# Patient Record
Sex: Female | Born: 1950
Health system: Southern US, Community
[De-identification: ages and names within clinical notes are randomized; demographics above are authoritative.]

## PROBLEM LIST (undated history)

## (undated) DIAGNOSIS — E119 Type 2 diabetes mellitus without complications: Secondary | ICD-10-CM

## (undated) DIAGNOSIS — E785 Hyperlipidemia, unspecified: Secondary | ICD-10-CM

## (undated) DIAGNOSIS — E1159 Type 2 diabetes mellitus with other circulatory complications: Secondary | ICD-10-CM

## (undated) DIAGNOSIS — E1122 Type 2 diabetes mellitus with diabetic chronic kidney disease: Secondary | ICD-10-CM

## (undated) DIAGNOSIS — T7840XA Allergy, unspecified, initial encounter: Secondary | ICD-10-CM

## (undated) DIAGNOSIS — H269 Unspecified cataract: Secondary | ICD-10-CM

## (undated) DIAGNOSIS — I10 Essential (primary) hypertension: Secondary | ICD-10-CM

## (undated) DIAGNOSIS — M5412 Radiculopathy, cervical region: Secondary | ICD-10-CM

## (undated) DIAGNOSIS — M19019 Primary osteoarthritis, unspecified shoulder: Secondary | ICD-10-CM

## (undated) DIAGNOSIS — D509 Iron deficiency anemia, unspecified: Secondary | ICD-10-CM

## (undated) DIAGNOSIS — N182 Chronic kidney disease, stage 2 (mild): Secondary | ICD-10-CM

## (undated) DIAGNOSIS — E559 Vitamin D deficiency, unspecified: Secondary | ICD-10-CM

## (undated) DIAGNOSIS — E1169 Type 2 diabetes mellitus with other specified complication: Secondary | ICD-10-CM

## (undated) DIAGNOSIS — M4722 Other spondylosis with radiculopathy, cervical region: Secondary | ICD-10-CM

## (undated) HISTORY — DX: Hyperlipidemia, unspecified: E78.5

## (undated) HISTORY — DX: Chronic kidney disease, stage 2 (mild): N18.2

## (undated) HISTORY — DX: Type 2 diabetes mellitus with other specified complication: E11.69

## (undated) HISTORY — DX: Radiculopathy, cervical region: M54.12

## (undated) HISTORY — DX: Iron deficiency anemia, unspecified: D50.9

## (undated) HISTORY — DX: Unspecified cataract: H26.9

## (undated) HISTORY — DX: Type 2 diabetes mellitus without complications: E11.9

## (undated) HISTORY — DX: Type 2 diabetes mellitus with diabetic chronic kidney disease: E11.22

## (undated) HISTORY — DX: Type 2 diabetes mellitus with other circulatory complications: E11.59

## (undated) HISTORY — DX: Essential (primary) hypertension: I10

## (undated) HISTORY — DX: Allergy, unspecified, initial encounter: T78.40XA

## (undated) HISTORY — PX: OTHER SURGICAL HISTORY: SHX169

## (undated) HISTORY — DX: Other spondylosis with radiculopathy, cervical region: M47.22

## (undated) HISTORY — DX: Vitamin D deficiency, unspecified: E55.9

## (undated) HISTORY — DX: Primary osteoarthritis, unspecified shoulder: M19.019

---

## 2002-07-30 ENCOUNTER — Other Ambulatory Visit: Admission: RE | Admit: 2002-07-30 | Discharge: 2002-07-30 | Payer: Self-pay | Admitting: Obstetrics and Gynecology

## 2003-10-20 ENCOUNTER — Other Ambulatory Visit: Admission: RE | Admit: 2003-10-20 | Discharge: 2003-10-20 | Payer: Self-pay | Admitting: Obstetrics and Gynecology

## 2004-11-21 ENCOUNTER — Other Ambulatory Visit: Admission: RE | Admit: 2004-11-21 | Discharge: 2004-11-21 | Payer: Self-pay | Admitting: Obstetrics and Gynecology

## 2005-12-09 ENCOUNTER — Other Ambulatory Visit: Admission: RE | Admit: 2005-12-09 | Discharge: 2005-12-09 | Payer: Self-pay | Admitting: Obstetrics and Gynecology

## 2006-06-30 ENCOUNTER — Ambulatory Visit (HOSPITAL_COMMUNITY): Admission: RE | Admit: 2006-06-30 | Discharge: 2006-06-30 | Payer: Self-pay | Admitting: Pulmonary Disease

## 2008-09-19 ENCOUNTER — Ambulatory Visit (HOSPITAL_COMMUNITY): Admission: RE | Admit: 2008-09-19 | Discharge: 2008-09-19 | Payer: Self-pay | Admitting: Pulmonary Disease

## 2008-10-18 ENCOUNTER — Ambulatory Visit (HOSPITAL_COMMUNITY): Admission: RE | Admit: 2008-10-18 | Discharge: 2008-10-18 | Payer: Self-pay | Admitting: Pulmonary Disease

## 2008-12-16 ENCOUNTER — Encounter: Admission: RE | Admit: 2008-12-16 | Discharge: 2009-02-28 | Payer: Self-pay | Admitting: Otolaryngology

## 2012-09-17 ENCOUNTER — Encounter (HOSPITAL_COMMUNITY): Payer: Self-pay | Admitting: *Deleted

## 2012-09-17 ENCOUNTER — Emergency Department (HOSPITAL_COMMUNITY)
Admission: EM | Admit: 2012-09-17 | Discharge: 2012-09-17 | Disposition: A | Discharge status: Hospice | Payer: Federal, State, Local not specified - PPO | Source: Home / Self Care

## 2012-09-17 ENCOUNTER — Encounter (HOSPITAL_COMMUNITY): Payer: Self-pay | Admitting: Emergency Medicine

## 2012-09-17 ENCOUNTER — Inpatient Hospital Stay (HOSPITAL_COMMUNITY)
Admission: EM | Admit: 2012-09-17 | Discharge: 2012-09-18 | DRG: 316 | Disposition: A | Discharge status: Home / Self Care | Payer: Federal, State, Local not specified - PPO | Attending: Internal Medicine | Admitting: Internal Medicine

## 2012-09-17 DIAGNOSIS — Z79899 Other long term (current) drug therapy: Secondary | ICD-10-CM

## 2012-09-17 DIAGNOSIS — R5383 Other fatigue: Secondary | ICD-10-CM

## 2012-09-17 DIAGNOSIS — N179 Acute kidney failure, unspecified: Principal | ICD-10-CM

## 2012-09-17 DIAGNOSIS — E1169 Type 2 diabetes mellitus with other specified complication: Secondary | ICD-10-CM

## 2012-09-17 DIAGNOSIS — I10 Essential (primary) hypertension: Secondary | ICD-10-CM

## 2012-09-17 DIAGNOSIS — R51 Headache: Secondary | ICD-10-CM

## 2012-09-17 DIAGNOSIS — R531 Weakness: Secondary | ICD-10-CM | POA: Diagnosis present

## 2012-09-17 DIAGNOSIS — R5381 Other malaise: Secondary | ICD-10-CM

## 2012-09-17 DIAGNOSIS — Z23 Encounter for immunization: Secondary | ICD-10-CM

## 2012-09-17 DIAGNOSIS — N058 Unspecified nephritic syndrome with other morphologic changes: Secondary | ICD-10-CM | POA: Diagnosis present

## 2012-09-17 DIAGNOSIS — E119 Type 2 diabetes mellitus without complications: Secondary | ICD-10-CM

## 2012-09-17 DIAGNOSIS — R519 Headache, unspecified: Secondary | ICD-10-CM

## 2012-09-17 DIAGNOSIS — IMO0002 Reserved for concepts with insufficient information to code with codable children: Secondary | ICD-10-CM

## 2012-09-17 DIAGNOSIS — M129 Arthropathy, unspecified: Secondary | ICD-10-CM | POA: Diagnosis present

## 2012-09-17 DIAGNOSIS — E1129 Type 2 diabetes mellitus with other diabetic kidney complication: Secondary | ICD-10-CM | POA: Diagnosis present

## 2012-09-17 DIAGNOSIS — E785 Hyperlipidemia, unspecified: Secondary | ICD-10-CM

## 2012-09-17 DIAGNOSIS — E860 Dehydration: Secondary | ICD-10-CM | POA: Diagnosis present

## 2012-09-17 HISTORY — DX: Essential (primary) hypertension: I10

## 2012-09-17 HISTORY — DX: Type 2 diabetes mellitus with other specified complication: E11.69

## 2012-09-17 LAB — CBC WITH DIFFERENTIAL/PLATELET
Basophils Absolute: 0 10*3/uL (ref 0.0–0.1)
Eosinophils Relative: 2 % (ref 0–5)
HCT: 31.2 % — ABNORMAL LOW (ref 36.0–46.0)
Lymphocytes Relative: 34 % (ref 12–46)
Lymphs Abs: 2.6 10*3/uL (ref 0.7–4.0)
MCV: 86.7 fL (ref 78.0–100.0)
Monocytes Absolute: 0.8 10*3/uL (ref 0.1–1.0)
Neutro Abs: 4 10*3/uL (ref 1.7–7.7)
Platelets: 262 10*3/uL (ref 150–400)
RBC: 3.6 MIL/uL — ABNORMAL LOW (ref 3.87–5.11)
RDW: 13.1 % (ref 11.5–15.5)
WBC: 7.6 10*3/uL (ref 4.0–10.5)

## 2012-09-17 LAB — CK: Total CK: 39 U/L (ref 7–177)

## 2012-09-17 LAB — BASIC METABOLIC PANEL
CO2: 26 mEq/L (ref 19–32)
Chloride: 99 mEq/L (ref 96–112)
Glucose, Bld: 107 mg/dL — ABNORMAL HIGH (ref 70–99)
Sodium: 136 mEq/L (ref 135–145)

## 2012-09-17 LAB — CBC
Hemoglobin: 10.8 g/dL — ABNORMAL LOW (ref 12.0–15.0)
MCH: 29 pg (ref 26.0–34.0)
MCV: 87.4 fL (ref 78.0–100.0)
RBC: 3.73 MIL/uL — ABNORMAL LOW (ref 3.87–5.11)

## 2012-09-17 LAB — TROPONIN I: Troponin I: <0.3 ng/mL (ref <0.30)

## 2012-09-17 LAB — URINALYSIS W MICROSCOPIC + REFLEX CULTURE
Glucose, UA: NEGATIVE mg/dL
Nitrite: NEGATIVE
Specific Gravity, Urine: 1.013 (ref 1.005–1.030)
pH: 5.5 (ref 5.0–8.0)

## 2012-09-17 LAB — POCT URINALYSIS DIP (DEVICE)
Protein, ur: NEGATIVE mg/dL
Specific Gravity, Urine: 1.025 (ref 1.005–1.030)
pH: 5.5 (ref 5.0–8.0)

## 2012-09-17 LAB — POCT I-STAT, CHEM 8
BUN: 81 mg/dL — ABNORMAL HIGH (ref 6–23)
Calcium, Ion: 1.32 mmol/L — ABNORMAL HIGH (ref 1.13–1.30)
Hemoglobin: 13.9 g/dL (ref 12.0–15.0)
TCO2: 33 mmol/L (ref 0–100)

## 2012-09-17 LAB — OSMOLALITY: Osmolality: 310 mOsm/kg — ABNORMAL HIGH (ref 275–300)

## 2012-09-17 LAB — POCT PREGNANCY, URINE: Preg Test, Ur: NEGATIVE

## 2012-09-17 MED ORDER — POLYETHYLENE GLYCOL 3350 17 G PO PACK
17.0000 g | PACK | Freq: Every day | ORAL | Status: DC | PRN
  Filled 2012-09-17: qty 1

## 2012-09-17 MED ORDER — GUAIFENESIN-DM 100-10 MG/5ML PO SYRP: 5.0000 mL | ORAL_SOLUTION | ORAL | Status: DC | PRN

## 2012-09-17 MED ORDER — ALBUTEROL SULFATE (5 MG/ML) 0.5% IN NEBU: 2.5000 mg | INHALATION_SOLUTION | RESPIRATORY_TRACT | Status: DC | PRN

## 2012-09-17 MED ORDER — INSULIN ASPART 100 UNIT/ML ~~LOC~~ SOLN: 0.0000 [IU] | Freq: Three times a day (TID) | SUBCUTANEOUS | Status: DC

## 2012-09-17 MED ORDER — ONDANSETRON HCL 4 MG/2ML IJ SOLN: 4.0000 mg | Freq: Four times a day (QID) | INTRAMUSCULAR | Status: DC | PRN

## 2012-09-17 MED ORDER — FLUCONAZOLE 100 MG PO TABS: ORAL_TABLET | ORAL | Status: DC

## 2012-09-17 MED ORDER — OMEGA-3-ACID ETHYL ESTERS 1 G PO CAPS
1.0000 g | ORAL_CAPSULE | Freq: Two times a day (BID) | ORAL | Status: DC
  Administered 2012-09-17: 1 g via ORAL
  Filled 2012-09-17 (×3): qty 1

## 2012-09-17 MED ORDER — CALCIUM CARBONATE-VITAMIN D 500-200 MG-UNIT PO TABS
1.0000 | ORAL_TABLET | Freq: Every day | ORAL | Status: DC
  Administered 2012-09-18: 1 via ORAL
  Filled 2012-09-17 (×2): qty 1

## 2012-09-17 MED ORDER — ONDANSETRON HCL 4 MG PO TABS: 4.0000 mg | ORAL_TABLET | Freq: Four times a day (QID) | ORAL | Status: DC | PRN

## 2012-09-17 MED ORDER — SODIUM CHLORIDE 0.9 % IV BOLUS (SEPSIS)
2000.0000 mL | Freq: Once | INTRAVENOUS | Status: AC
  Administered 2012-09-17: 2000 mL via INTRAVENOUS

## 2012-09-17 MED ORDER — LINAGLIPTIN 5 MG PO TABS
5.0000 mg | ORAL_TABLET | Freq: Every day | ORAL | Status: DC
  Filled 2012-09-17: qty 1

## 2012-09-17 MED ORDER — PNEUMOCOCCAL VAC POLYVALENT 25 MCG/0.5ML IJ INJ
0.5000 mL | INJECTION | INTRAMUSCULAR | Status: DC
  Filled 2012-09-17: qty 0.5

## 2012-09-17 MED ORDER — SITAGLIPTIN PHOS-METFORMIN HCL 50-1000 MG PO TABS: 1.0000 | ORAL_TABLET | Freq: Every day | ORAL | Status: DC

## 2012-09-17 MED ORDER — SODIUM CHLORIDE 0.9 % IJ SOLN
3.0000 mL | Freq: Two times a day (BID) | INTRAMUSCULAR | Status: DC
  Administered 2012-09-17: 3 mL via INTRAVENOUS

## 2012-09-17 MED ORDER — PREDNISONE 5 MG PO TABS
5.0000 mg | ORAL_TABLET | Freq: Every day | ORAL | Status: DC
  Administered 2012-09-18: 5 mg via ORAL
  Filled 2012-09-17 (×2): qty 1

## 2012-09-17 MED ORDER — HYDROCODONE-ACETAMINOPHEN 5-325 MG PO TABS: 1.0000 | ORAL_TABLET | ORAL | Status: DC | PRN

## 2012-09-17 MED ORDER — SODIUM CHLORIDE 0.9 % IV SOLN
INTRAVENOUS | Status: DC
  Administered 2012-09-18: 1000 mL via INTRAVENOUS

## 2012-09-17 MED ORDER — SODIUM CHLORIDE 0.9 % IV SOLN: Freq: Once | INTRAVENOUS | Status: DC

## 2012-09-17 MED ORDER — ATORVASTATIN CALCIUM 10 MG PO TABS
10.0000 mg | ORAL_TABLET | Freq: Every day | ORAL | Status: DC
  Filled 2012-09-17: qty 1

## 2012-09-17 MED ORDER — HEPARIN SODIUM (PORCINE) 5000 UNIT/ML IJ SOLN
5000.0000 [IU] | Freq: Three times a day (TID) | INTRAMUSCULAR | Status: DC
  Administered 2012-09-17 – 2012-09-18 (×2): 5000 [IU] via SUBCUTANEOUS
  Filled 2012-09-17 (×5): qty 1

## 2012-09-17 NOTE — H&P (Addendum)
[facility]                                                                                    Patient Demographics  Kristie King, is a 62 y.o. female  CSN: [id number]  MRN: [id number]  DOB - [birth year]  Admit Date - 09/17/2012  Outpatient Primary MD for the patient is Eino Farber, MD   With History of -  Past Medical History  Diagnosis Date  . Diabetes mellitus without complication   . Dyslipidemia 09/17/2012  . HTN (hypertension) 09/17/2012      History reviewed. No pertinent past surgical history.  in for   Chief Complaint  Patient presents with  . Headache  . Fatigue     HPI  Kristie King  is a 62 y.o. female, with history of diabetes mellitus type 2, hypertension, dyslipidemia, chronic skin rash of unclear type for which she has been placed on prednisone by her PCP for a while, who has been on Lasix  "for fluid retention along her belly due to prednisone side effect" , also takes Motrin on as needed basis for joint pains and aches, also exercises on a regular basis, she was in her usual state of health until a few days ago when she started experiencing generalized weakness and some lightheadedness, she called her PCPs office and was placed on meclizine without much benefit, she then presented to the ER where workup was suggested of dehydration and acute renal failure and I was called to admit the patient.    Review of Systems    In addition to the HPI above,   No Fever-chills, No Headache, No changes with Vision or hearing, No problems swallowing food or Liquids, No Chest pain, Cough or Shortness of Breath, No Abdominal pain, No Nausea or Vommitting, Bowel movements are regular, No Blood in stool or Urine, No dysuria, No new skin rashes or bruises, No new joints pains-aches,  No new weakness, tingling, numbness in any extremity, positive for generalized weakness and some lightheadedness over the last few days No recent weight gain or loss, No  polyuria, polydypsia or polyphagia, No significant Mental Stressors.  A full 10 point Review of Systems was done, except as stated above, all other Review of Systems were negative.   Social History History  Substance Use Topics  . Smoking status: No   . Smokeless tobacco: No   . Alcohol Use: No       Family History No history of chronic kidney disease in the family  Prior to Admission medications   Medication Sig Start Date End Date Taking? Authorizing Provider  amLODipine-benazepril (LOTREL) 5-20 MG per capsule Take 1 capsule by mouth daily.   Yes Historical Provider, MD  Calcium Carbonate-Vitamin D (OSCAL 500/200 D-3 PO) Take 1 tablet by mouth daily.   Yes Historical Provider, MD  Choline Fenofibrate 135 MG capsule Take 135 mg by mouth daily.   Yes Historical Provider, MD  fluticasone (VERAMYST) 27.5 MCG/SPRAY nasal spray Place 2 sprays into both nostrils 2 (two) times daily.   Yes Historical Provider, MD  furosemide (LASIX) 40 MG tablet Take 40 mg by mouth daily.   Yes  Historical Provider, MD  ibuprofen (ADVIL,MOTRIN) 800 MG tablet Take 800 mg by mouth daily. Taking as scheduled   Yes Historical Provider, MD  meclizine (ANTIVERT) 25 MG tablet Take 25 mg by mouth 3 (three) times daily as needed for dizziness or nausea.    Yes Historical Provider, MD  Omega-3 Fatty Acids (FISH OIL) 1000 MG CAPS Take 2 capsules by mouth daily.   Yes Historical Provider, MD  predniSONE (DELTASONE) 5 MG tablet Take 5 mg by mouth daily.   Yes Historical Provider, MD  rosuvastatin (CRESTOR) 10 MG tablet Take 10 mg by mouth daily.   Yes Historical Provider, MD  sitaGLIPtan-metformin (JANUMET) 50-1000 MG per tablet Take 1 tablet by mouth daily.   Yes Historical Provider, MD    No Known Allergies  Physical Exam  Vitals  Blood pressure 106/76, pulse 73, temperature 97.7 F (36.5 C), temperature source Oral, resp. rate 16, SpO2 100.00%.   1. General late African American female lying in bed in NAD,     2. Normal affect and insight, Not Suicidal or Homicidal, Awake Alert, Oriented X 3.  3. No F.N deficits, ALL C.Nerves Intact, Strength 5/5 all 4 extremities, Sensation intact all 4 extremities, Plantars down going.  4. Ears and Eyes appear Normal, Conjunctivae clear, PERRLA. Moist Oral Mucosa.  5. Supple Neck, No JVD, No cervical lymphadenopathy appriciated, No Carotid Bruits.  6. Symmetrical Chest wall movement, Good air movement bilaterally, CTAB.  7. RRR, No Gallops, Rubs or Murmurs, No Parasternal Heave.  8. Positive Bowel Sounds, Abdomen Soft, Non tender, No organomegaly appriciated,No rebound -guarding or rigidity.  9.  No Cyanosis, Normal Skin Turgor, No Skin Rash or Bruise.  10. Good muscle tone,  joints appear normal , no effusions, Normal ROM.  11. No Palpable Lymph Nodes in Neck or Axillae     Data Review  CBC  Recent Labs Lab 09/17/12 1206 09/17/12 1632  WBC  --  7.6  HGB 13.9 10.5*  HCT 41.0 31.2*  PLT  --  262  MCV  --  86.7  MCH  --  29.2  MCHC  --  33.7  RDW  --  13.1  LYMPHSABS  --  2.6  MONOABS  --  0.8  EOSABS  --  0.2  BASOSABS  --  0.0   ------------------------------------------------------------------------------------------------------------------  Chemistries   Recent Labs Lab 09/17/12 1206 09/17/12 1632  NA 138 136  K 3.6 3.8  CL 96 99  CO2  --  26  GLUCOSE 58* 107*  BUN 81* 77*  CREATININE 3.00* 2.39*  CALCIUM  --  9.2   ------------------------------------------------------------------------------------------------------------------ CrCl is unknown because there is no height on file for the current visit. ------------------------------------------------------------------------------------------------------------------ No results found for this basename: TSH, T4TOTAL, FREET3, T3FREE, THYROIDAB,  in the last 72 hours   Coagulation profile No results found for this basename: INR, PROTIME,  in the last 168  hours ------------------------------------------------------------------------------------------------------------------- No results found for this basename: DDIMER,  in the last 72 hours -------------------------------------------------------------------------------------------------------------------  Cardiac Enzymes  Recent Labs Lab 09/17/12 1632  TROPONINI <0.30   ------------------------------------------------------------------------------------------------------------------ No components found with this basename: POCBNP,    ---------------------------------------------------------------------------------------------------------------  Urinalysis    Component Value Date/Time   LABSPEC 1.025 09/17/2012 1233   PHURINE 5.5 09/17/2012 1233   GLUCOSEU NEGATIVE 09/17/2012 1233   HGBUR NEGATIVE 09/17/2012 1233   BILIRUBINUR NEGATIVE 09/17/2012 1233   KETONESUR TRACE* 09/17/2012 1233   PROTEINUR NEGATIVE 09/17/2012 1233   UROBILINOGEN 0.2 09/17/2012 1233   NITRITE NEGATIVE  09/17/2012 1233   LEUKOCYTESUR MODERATE* 09/17/2012 1233    ----------------------------------------------------------------------------------------------------------------  Imaging results:   No results found.  My personal review of EKG: Rhythm NSR, Rate  69/min,   no Acute ST changes    Assessment & Plan     1. Acute renal failure in a patient who's been on Lasix and Motrin for a while. Likely due to prerenal azotemia along with nephrotoxic effects of NSAIDs. She will be admitted on a telemetry bed, will obtain urine electrolytes, IV fluids for hydration, will also check a CK level and a urine myoglobin and as she has been exercising lately, repeat BMP in the morning. Get labs from PCP to check her baseline renal function. No labs in the system. Of note creatinine has already improved after IV fluids in the ER.   Addendum PCP office called ER. Her last creatinine 2 months ago was 1.38. Likely has chronic  kidney disease stage III underlying probably from diabetic nephropathy.     2. History of diabetes mellitus type 2. Continue home oral supplementation along with sliding scale insulin, will check A1c.    3. Hypertension we'll continue home dose Norvasc, will discontinue Lasix.    4. Dyslipidemia continue home dose statin.    5. Generalized weakness due to dehydration and from #1 above. Supportive care along with IV fluids. She is already feeling better.     DVT Prophylaxis Heparin    AM Labs Ordered, also please review Full Orders  Family Communication: Admission, patients condition and plan of care including tests being ordered have been discussed with the patient full who indicates understanding and agree with the plan and Code Status.  Code Status full  Likely DC to home  Time spent in minutes :35  Condition Marinell Blight K M.D on 09/17/2012 at 5:38 PM  Between 7am to 7pm - Pager - [phone]  After 7pm go to [email or web address] - password [email or web address]  And look for the night coverage person covering me after hours  [facility] Office  [phone]

## 2012-09-17 NOTE — ED Notes (Signed)
Admission MD at bedside.  

## 2012-09-17 NOTE — ED Notes (Signed)
IV NS  TKO  VIA  20  ANGIO      L  FOREARM      1   ATT

## 2012-09-17 NOTE — ED Provider Notes (Signed)
Medical screening examination/treatment/procedure(s) were performed by a resident physician or non-physician practitioner and as the supervising physician I was immediately available for consultation/collaboration.  Clementeen Graham, MD   Rodolph Bong, MD 09/17/12 [phone]

## 2012-09-17 NOTE — ED Notes (Signed)
Will draw lab work after 2L of fluid runs in per MD.

## 2012-09-17 NOTE — ED Provider Notes (Signed)
History    CSN: [id number] Arrival date & time 09/17/12  1339  First MD Initiated Contact with Patient 09/17/12 1411     Chief Complaint  Patient presents with  . Headache  . Fatigue   (Consider location/radiation/quality/duration/timing/severity/associated sxs/prior Treatment) Patient is a 62 y.o. female presenting with headaches. The history is provided by the patient and a relative.  Headache Associated symptoms comment:  Kristie King is a 62 y.o. female h/o HTN, DM, here with fatigue for past 5 days.  She admits to intermittent headaches and presyncope as well.  Patient denies any neurological symptoms, chest pain, SOB, blood in her stool or urine.  She has been eating and drinking well during the interval.  She went to an urgent care facility who found her Cr to be 3.00.  She was transferred her for continued care.    Past Medical History  Diagnosis Date  . Diabetes mellitus without complication    History reviewed. No pertinent past surgical history. No family history on file. History  Substance Use Topics  . Smoking status: Not on file  . Smokeless tobacco: Not on file  . Alcohol Use: Not on file   OB History   Grav Para Term Preterm Abortions TAB SAB Ect Mult Living                 Review of Systems  Neurological: Positive for headaches.  10 Systems reviewed and are negative for acute change except as noted in the HPI.   Allergies  Review of patient's allergies indicates no known allergies.  Home Medications   Current Outpatient Rx  Name  Route  Sig  Dispense  Refill  . amLODipine-benazepril (LOTREL) 5-20 MG per capsule   Oral   Take 1 capsule by mouth daily.         . Calcium Carbonate-Vitamin D (OSCAL 500/200 D-3 PO)   Oral   Take 1 tablet by mouth daily.         . Choline Fenofibrate 135 MG capsule   Oral   Take 135 mg by mouth daily.         . fluticasone (VERAMYST) 27.5 MCG/SPRAY nasal spray   Each Nare   Place 2 sprays into both nostrils  2 (two) times daily.         . furosemide (LASIX) 40 MG tablet   Oral   Take 40 mg by mouth daily.         Marland Kitchen ibuprofen (ADVIL,MOTRIN) 800 MG tablet   Oral   Take 800 mg by mouth daily. Taking as scheduled         . meclizine (ANTIVERT) 25 MG tablet   Oral   Take 25 mg by mouth 3 (three) times daily as needed for dizziness or nausea.          . Omega-3 Fatty Acids (FISH OIL) 1000 MG CAPS   Oral   Take 2 capsules by mouth daily.         . predniSONE (DELTASONE) 5 MG tablet   Oral   Take 5 mg by mouth daily.         . rosuvastatin (CRESTOR) 10 MG tablet   Oral   Take 10 mg by mouth daily.         . sitaGLIPtan-metformin (JANUMET) 50-1000 MG per tablet   Oral   Take 1 tablet by mouth daily.          BP 106/76  Pulse 73  Temp(Src)  97.7 F (36.5 C) (Oral)  Resp 16  SpO2 100% Physical Exam  Nursing note and vitals reviewed. Constitutional: She is oriented to person, place, and time. She appears well-developed and well-nourished. No distress.  HENT:  Head: Normocephalic and atraumatic.  Eyes: Conjunctivae and EOM are normal. Pupils are equal, round, and reactive to light. No scleral icterus.  Neck: Normal range of motion. Neck supple. No JVD present. No tracheal deviation present. No thyromegaly present.  Cardiovascular: Normal rate, regular rhythm and normal heart sounds.  Exam reveals no gallop and no friction rub.   No murmur heard. Pulmonary/Chest: Effort normal and breath sounds normal. No respiratory distress. She has no wheezes. She exhibits no tenderness.  Abdominal: Soft. Bowel sounds are normal. She exhibits no distension and no mass. There is no tenderness. There is no rebound and no guarding.  Musculoskeletal: Normal range of motion. She exhibits no edema and no tenderness.  Lymphadenopathy:    She has no cervical adenopathy.  Neurological: She is alert and oriented to person, place, and time.  Skin: Skin is warm and dry. No rash noted. No  erythema. No pallor.    ED Course  Procedures (including critical care time) Labs Reviewed  GLUCOSE, CAPILLARY  CBC WITH DIFFERENTIAL  BASIC METABOLIC PANEL  TROPONIN I  URINALYSIS, ROUTINE W REFLEX MICROSCOPIC   No results found. No diagnosis found.  MDM  Patient given 2L IVF and repeat BMP was drawn.  She is currently asymptomatic and feels relatively well.  Patient is non-toxic appearing.    She states she feels better but is still weak.  Cr improved to 2.4 with fluids.  UA pending.  Will admit to [facility] for continued car and evaluation.  Patient amendable with plan.  Tomasita Crumble, MD 09/17/12 1714

## 2012-09-17 NOTE — ED Notes (Signed)
Pt transferred from urgent care with c/o headache and general malaise since Sunday. CBG 58, nothing given. Acute renal failure. No n/v/d.

## 2012-09-17 NOTE — ED Notes (Signed)
Patient states she is fatigue and headache since Sunday.  Patient says a some points when she stands up she gets dizzy.  States primary sent her here.

## 2012-09-17 NOTE — ED Provider Notes (Signed)
History    CSN: [id number] Arrival date & time 09/17/12  1032  First MD Initiated Contact with Patient 09/17/12 1042     Chief Complaint  Patient presents with  . Headache  . Fatigue   (Consider location/radiation/quality/duration/timing/severity/associated sxs/prior Treatment) HPI Comments: 62 year old female presents complaining of fatigue, positional dizziness, headache. This began on Sunday 4 days ago. The headache is bilateral located in a bandlike distribution across the forehead and is mild. She says the fatigue is her most significant symptom. She says this is very unlike her to have fatigue like this. The dizziness is associated with going from a lying to a standing position. She called her primary care physician about the dizziness and he sent in meclizine which has not helped at all. She also admits to some blurry vision, but this has not been confined to the past 4 days and she states she thinks she just needs another eye exam. She denies fever, chills, chest pain, abdominal pain, rash, leg swelling, palpitations. She does admit to some mild lower back pain last night, she believes this was related to picking up a heavy potted plant earlier in the day.   Patient is a 62 y.o. female presenting with headaches.  Headache Associated symptoms: back pain and fatigue   Associated symptoms: no abdominal pain, no congestion, no cough, no diarrhea, no dizziness, no drainage, no ear pain, no fever, no myalgias, no nausea, no neck pain, no numbness, no photophobia, no seizures, no sore throat and no vomiting    History reviewed. No pertinent past medical history. No past surgical history on file. No family history on file. History  Substance Use Topics  . Smoking status: Not on file  . Smokeless tobacco: Not on file  . Alcohol Use: Not on file   OB History   Grav Para Term Preterm Abortions TAB SAB Ect Mult Living                 Review of Systems  Constitutional: Positive for  activity change, appetite change and fatigue. Negative for fever, chills, diaphoresis and unexpected weight change.  HENT: Negative for ear pain, congestion, sore throat, neck pain and postnasal drip.   Eyes: Positive for visual disturbance. Negative for photophobia, discharge and redness.  Respiratory: Negative for cough, chest tightness and shortness of breath.   Cardiovascular: Negative for chest pain, palpitations and leg swelling.  Gastrointestinal: Negative for nausea, vomiting, abdominal pain, diarrhea, constipation and abdominal distention.  Endocrine: Negative for cold intolerance, heat intolerance, polydipsia and polyuria.  Genitourinary: Negative for dysuria, urgency, frequency, hematuria, flank pain, decreased urine volume, vaginal bleeding, vaginal discharge, vaginal pain and pelvic pain.  Musculoskeletal: Positive for back pain. Negative for myalgias and arthralgias.  Skin: Negative for pallor, rash and wound.  Neurological: Positive for weakness, light-headedness and headaches. Negative for dizziness, seizures, syncope, facial asymmetry, speech difficulty and numbness.  Psychiatric/Behavioral: Negative for sleep disturbance and agitation. The patient is not nervous/anxious.     Allergies  Review of patient's allergies indicates no known allergies.  Home Medications   Current Outpatient Rx  Name  Route  Sig  Dispense  Refill  . Amlodipine Besy-Benazepril HCl (LOTREL PO)   Oral   Take by mouth.         . Choline Fenofibrate (TRILIPIX PO)   Oral   Take by mouth.         . FUROSEMIDE PO   Oral   Take by mouth.         Marland Kitchen  IBUPROFEN PO   Oral   Take by mouth.         . meclizine (ANTIVERT) 25 MG tablet   Oral   Take 25 mg by mouth 3 (three) times daily as needed.         . predniSONE (DELTASONE) 5 MG tablet   Oral   Take 5 mg by mouth daily.         . Rosuvastatin Calcium (CRESTOR PO)   Oral   Take by mouth.         . SitaGLIPtin-MetFORMIN HCl  (JANUMET PO)   Oral   Take by mouth.          BP 100/68  Pulse 84  Temp(Src) 97.6 F (36.4 C) (Oral)  Resp 18  SpO2 99% Physical Exam  Nursing note and vitals reviewed. Constitutional: She is oriented to person, place, and time. Vital signs are normal. She appears well-developed and well-nourished. No distress.  HENT:  Head: Normocephalic and atraumatic.  Right Ear: External ear normal.  Left Ear: External ear normal.  Mouth/Throat: Oropharynx is clear and moist.  Eyes: Conjunctivae and EOM are normal. Pupils are equal, round, and reactive to light. No scleral icterus.  Neck: Normal range of motion. Neck supple. No JVD present. No thyromegaly present.  Cardiovascular: Normal rate, regular rhythm and normal heart sounds.  Exam reveals no gallop and no friction rub.   No murmur heard. Pulmonary/Chest: Effort normal and breath sounds normal. No respiratory distress. She has no wheezes. She has no rales.  Abdominal: Soft. Bowel sounds are normal. She exhibits no distension and no mass. There is no tenderness. There is no rebound and no guarding.  Musculoskeletal: Normal range of motion. She exhibits no edema and no tenderness.  Lymphadenopathy:    She has no cervical adenopathy.  Neurological: She is alert and oriented to person, place, and time. She has normal strength. Coordination normal.  Skin: Skin is warm and dry. No rash noted. She is not diaphoretic.  Psychiatric: She has a normal mood and affect. Her behavior is normal. Judgment and thought content normal.    ED Course  Procedures (including critical care time) Labs Reviewed  POCT I-STAT, CHEM 8 - Abnormal; Notable for the following:    BUN 81 (*)    Creatinine, Ser 3.00 (*)    Glucose, Bld 58 (*)    Calcium, Ion 1.32 (*)    All other components within normal limits  POCT URINALYSIS DIP (DEVICE) - Abnormal; Notable for the following:    Ketones, ur TRACE (*)    Leukocytes, UA MODERATE (*)    All other components  within normal limits  POCT PREGNANCY, URINE  POCT INFECTIOUS MONO SCREEN   No results found. 1. Acute kidney injury   2. Fatigue   3. Headache     MDM   Spoke with her primary care physician office on the phone, this patient's creatinine was 1.38 in April a few months ago.  Starting IV fluids at a rate of KVO, transferring to the emergency department via [facility]  Graylon Good, PA-C 09/17/12 1254

## 2012-09-18 LAB — URINE CULTURE

## 2012-09-18 LAB — BASIC METABOLIC PANEL
BUN: 53 mg/dL — ABNORMAL HIGH (ref 6–23)
CO2: 31 mEq/L (ref 19–32)
Chloride: 104 mEq/L (ref 96–112)
GFR calc Af Amer: 44 mL/min — ABNORMAL LOW (ref >90)
Glucose, Bld: 84 mg/dL (ref 70–99)
Potassium: 4.2 mEq/L (ref 3.5–5.1)

## 2012-09-18 LAB — TSH: TSH: 0.415 u[IU]/mL (ref 0.350–4.500)

## 2012-09-18 LAB — CBC
HCT: 31.4 % — ABNORMAL LOW (ref 36.0–46.0)
Hemoglobin: 10.5 g/dL — ABNORMAL LOW (ref 12.0–15.0)
MCHC: 33.4 g/dL (ref 30.0–36.0)

## 2012-09-18 LAB — HEMOGLOBIN A1C: Hgb A1c MFr Bld: 6.5 % — ABNORMAL HIGH (ref <5.7)

## 2012-09-18 MED ORDER — AMLODIPINE BESYLATE 10 MG PO TABS: 10.0000 mg | ORAL_TABLET | Freq: Every day | ORAL | Status: DC

## 2012-09-18 NOTE — Progress Notes (Signed)
Discharge instructions along with med list and scripts provided. IV d/c'd with catheter intact. Patient refused transport via wheelchair to lobby. Ambulated with husband to main lobby.Mamie Levers

## 2012-09-18 NOTE — ED Provider Notes (Signed)
I saw and evaluated the patient, reviewed the resident's note and I agree with the findings and plan. Well appearing F with new onset renal failure. Normal exam. Will admit for hydration and further workup.   Loren Racer, MD 09/18/12 [phone]

## 2012-09-18 NOTE — Discharge Summary (Signed)
[facility]                                                                                   [name], is a 62 y.o. female  DOB 11/05/1950  MRN [id number].  Admission date:  09/17/2012  Discharge Date:  09/18/2012  Primary MD  Petra Kuba Minda Meo, MD  Admitting Physician  Leroy Sea, MD  Admission Diagnosis  Diabetes mellitus without complication [250.00] HTN (hypertension) [401.9] ARF (acute renal failure) [584.9] Dyslipidemia [272.4] General weakness [780.79]  Discharge Diagnosis     Principal Problem:   ARF (acute renal failure) Active Problems:   Diabetes mellitus without complication   General weakness   Dyslipidemia   HTN (hypertension)      Past Medical History  Diagnosis Date  . Dyslipidemia 09/17/2012  . HTN (hypertension) 09/17/2012  . Type II diabetes mellitus   . Arthritis     "knees; elbows" (09/17/2012)    Past Surgical History  Procedure Laterality Date  . No past surgeries       Recommendations for primary care physician for things to follow:   Follow BMP   Discharge Diagnoses:   Principal Problem:   ARF (acute renal failure) Active Problems:   Diabetes mellitus without complication   General weakness   Dyslipidemia   HTN (hypertension)    Discharge Condition: stable   Diet recommendation: See Discharge Instructions below   Consults      History of present illness and  Hospital Course:     Kindly see H&P for history of present illness and admission details, please review complete Labs, Consult reports and Test reports for all details in brief Kristie King, is a 62 y.o. female, patient with history of DM-2, CKD 3 (1.3), HTN, who was on lasix,ACE,NSAIDs came in for weakness and fatigue, workup showed dehydration along with ARF with creatinine close to 3 from 1.3, all pre renal on CKD, almost resolved with IVF and holding offending Meds, UA stable, will be sent home with PCP and renal follow up, offending Meds  stopped, patient completely symptom free.   Continue home meds for DM-2.       Today   Subjective:   Kristie King today has no headache,no chest abdominal pain,no new weakness tingling or numbness, feels much better wants to go home today.    Objective:   Blood pressure 113/72, pulse 60, temperature 97.8 F (36.6 C), temperature source Oral, resp. rate 18, height 5\' 3"  (1.6 m), weight 69.3 kg (152 lb 12.5 oz), SpO2 98.00%.  No intake or output data in the 24 hours ending 09/18/12 0759  Exam Awake Alert, Oriented *3, No new F.N deficits, Normal affect [place].AT,PERRAL Supple Neck,No JVD, No cervical lymphadenopathy appriciated.  Symmetrical Chest wall movement, Good air movement bilaterally, CTAB RRR,No Gallops,Rubs or new Murmurs, No Parasternal Heave +ve B.Sounds, Abd Soft, Non tender, No organomegaly appriciated, No rebound -guarding or rigidity. No Cyanosis, Clubbing or edema, No new Rash or bruise  Data Review   Major procedures and Radiology Reports - PLEASE review detailed and final reports for all details in brief -       No results found.  Micro Results      No results found for this or any previous visit (from the past 240 hour(s)).   CBC w Diff:  Lab Results  Component Value Date   WBC 6.9 09/18/2012   HGB 10.5* 09/18/2012   HCT 31.4* 09/18/2012   PLT 278 09/18/2012   LYMPHOPCT 34 09/17/2012   MONOPCT 11 09/17/2012   EOSPCT 2 09/17/2012   BASOPCT 0 09/17/2012    CMP:  Lab Results  Component Value Date   NA 140 09/18/2012   K 4.2 09/18/2012   CL 104 09/18/2012   CO2 31 09/18/2012   BUN 53* 09/18/2012   CREATININE 1.45* 09/18/2012  .   Discharge Instructions     Follow with Primary MD Eino Farber, MD in 7 days   Get CBC, CMP, checked 7 days by Primary MD and again as instructed by your Primary MD.   Get Medicines reviewed and adjusted.  Please request your Prim.MD to go over all Hospital Tests and Procedure/Radiological results at the  follow up, please get all Hospital records sent to your Prim MD by signing hospital release before you go home.  Activity: As tolerated with Full fall precautions use walker/cane & assistance as needed   Diet:  Heart Healthy-Low carb.  For Heart failure patients - Check your Weight same time everyday, if you gain over 2 pounds, or you develop in leg swelling, experience more shortness of breath or chest pain, call your Primary MD immediately. Follow Cardiac Low Salt Diet and 1.8 lit/day fluid restriction.  Disposition Home    If you experience worsening of your admission symptoms, develop shortness of breath, life threatening emergency, suicidal or homicidal thoughts you must seek medical attention immediately by calling 911 or calling your MD immediately  if symptoms less severe.  You Must read complete instructions/literature along with all the possible adverse reactions/side effects for all the Medicines you take and that have been prescribed to you. Take any new Medicines after you have completely understood and accpet all the possible adverse reactions/side effects.   Do not drive and provide baby sitting services if your were admitted for syncope or siezures until you have seen by Primary MD or a Neurologist and advised to do so again.  Do not drive when taking Pain medications.    Do not take more than prescribed Pain, Sleep and Anxiety Medications  Special Instructions: If you have smoked or chewed Tobacco  in the last 2 yrs please stop smoking, stop any regular Alcohol  and or any Recreational drug use.  Wear Seat belts while driving.   Please note  You were cared for by a hospitalist during your hospital stay. If you have any questions about your discharge medications or the care you received while you were in the hospital after you are discharged, you can call the unit and asked to speak with the hospitalist on call if the hospitalist that took care of you is not available.  Once you are discharged, your primary care physician will handle any further medical issues. Please note that NO REFILLS for any discharge medications will be authorized once you are discharged, as it is imperative that you return to your primary care physician (or establish a relationship with a primary care physician if you do not have one) for your aftercare needs so that they can reassess your need for medications and monitor your lab values.   Follow-up Information   Follow up with [facility] Minda Meo, MD.  Schedule an appointment as soon as possible for a visit in 1 week.   Contact information:   [address] [place] [address] [phone]       Follow up with Lauris Poag, MD. Schedule an appointment as soon as possible for a visit in 1 week.   Contact information:   [address] [facility] [place] [address] [phone]         Discharge Medications     Medication List    STOP taking these medications       amLODipine-benazepril 5-20 MG per capsule  Commonly known as:  LOTREL     furosemide 40 MG tablet  Commonly known as:  LASIX     ibuprofen 800 MG tablet  Commonly known as:  ADVIL,MOTRIN      TAKE these medications       amLODipine 10 MG tablet  Commonly known as:  NORVASC  Take 1 tablet (10 mg total) by mouth daily.     Choline Fenofibrate 135 MG capsule  Take 135 mg by mouth daily.     Fish Oil 1000 MG Caps  Take 2 capsules by mouth daily.     fluticasone 27.5 MCG/SPRAY nasal spray  Commonly known as:  VERAMYST  Place 2 sprays into both nostrils 2 (two) times daily.     meclizine 25 MG tablet  Commonly known as:  ANTIVERT  Take 25 mg by mouth 3 (three) times daily as needed for dizziness or nausea.     OSCAL 500/200 D-3 PO  Take 1 tablet by mouth daily.     predniSONE 5 MG tablet  Commonly known as:  DELTASONE  Take 5 mg by mouth daily.     rosuvastatin 10 MG tablet  Commonly known as:  CRESTOR  Take  10 mg by mouth daily.     sitaGLIPtan-metformin 50-1000 MG per tablet  Commonly known as:  JANUMET  Take 1 tablet by mouth daily.           Total Time in preparing paper work, data evaluation and todays exam - 35 minutes  Leroy Sea M.D on 09/18/2012 at 7:59 AM  [facility] Office  [phone]

## 2012-12-30 ENCOUNTER — Ambulatory Visit (HOSPITAL_COMMUNITY)
Admission: RE | Admit: 2012-12-30 | Discharge: 2012-12-30 | Disposition: A | Discharge status: Home / Self Care | Payer: Federal, State, Local not specified - PPO | Source: Ambulatory Visit | Attending: Pulmonary Disease | Admitting: Pulmonary Disease

## 2012-12-30 ENCOUNTER — Other Ambulatory Visit (HOSPITAL_COMMUNITY): Payer: Self-pay | Admitting: Pulmonary Disease

## 2012-12-30 DIAGNOSIS — M545 Low back pain, unspecified: Secondary | ICD-10-CM

## 2013-02-09 ENCOUNTER — Ambulatory Visit: Payer: Self-pay | Admitting: Podiatry

## 2013-02-10 ENCOUNTER — Ambulatory Visit (INDEPENDENT_AMBULATORY_CARE_PROVIDER_SITE_OTHER): Payer: Federal, State, Local not specified - PPO | Admitting: Podiatry

## 2013-02-10 ENCOUNTER — Encounter: Payer: Self-pay | Admitting: Podiatry

## 2013-02-10 VITALS — BP 135/81 | HR 70 | Ht 63.0 in | Wt 152.0 lb

## 2013-02-10 DIAGNOSIS — M79609 Pain in unspecified limb: Secondary | ICD-10-CM

## 2013-02-10 DIAGNOSIS — M21619 Bunion of unspecified foot: Secondary | ICD-10-CM | POA: Insufficient documentation

## 2013-02-10 DIAGNOSIS — M2010 Hallux valgus (acquired), unspecified foot: Secondary | ICD-10-CM

## 2013-02-10 DIAGNOSIS — M79673 Pain in unspecified foot: Secondary | ICD-10-CM | POA: Insufficient documentation

## 2013-02-10 NOTE — Progress Notes (Signed)
Subjective:  Painful bunion for over 2 and a half years. It got worse past one year. NIDDM x 8 years. Stated that condition is under control.  Was diagnosed by an urgent care center with Kidney problem, but got better by discontinuing Ibuprofen.   Review of Systems - General ROS: negative for - chills, fatigue, fever, night sweats or sleep disturbance Ophthalmic ROS: negative ENT ROS: negative Allergy and Immunology ROS: negative Breast ROS: negative for breast lumps Respiratory ROS: no cough, shortness of breath, or wheezing Cardiovascular ROS: no chest pain or dyspnea on exertion Gastrointestinal ROS: no abdominal pain, change in bowel habits, or black or bloody stools Musculoskeletal ROS: negative Neurological ROS: no TIA or stroke symptoms Dermatological ROS: negative Has a Sarcoidosis since age 62. Has mild skin discoloration in left arm.  No change in lung lesion.  Objective: Dermatologic: No abnormal lesions. Vascular: All pedal pulses are palpable. No edema or erythema noted. Orthopedic: Severe bunion R>L. HAV with bunion bilateral with pain. Neurologic: All epicritic and tactile sensations grossly intact.  Normal response to Monofilament sensory testing.  Assessment: Painful hallux valgus with bunion bilateral R>L.  Plan: Reviewed all findings. May benefit from Orthotics after the bunion has been removed. Pre op consent form discussed for [facility] bunionectomy right foot.

## 2013-02-10 NOTE — Patient Instructions (Addendum)
Seen for painful feet.  Noted of Bunion deformity on both feet, which is causing foot pain. X-ray indicated that enlarged bunion and displaced first metatarsal bone. This condition may be helped with wide shoes to get by, or with possible surgical intervention. Surgery will be done at Out patient surgery center under IV sedation.  The surgery will be removing the bump and resetting the head portion of the bone with a screw fixation. Following the surgery the affected foot will be placed in Flat surgical shoe for 4-6 weeks.  Then tennis shoes afterward. Reviewed consent form for tentatively scheduled right foot bunion surgery on 02/18/13.

## 2013-02-12 ENCOUNTER — Ambulatory Visit: Payer: Federal, State, Local not specified - PPO | Admitting: Podiatry

## 2013-02-12 ENCOUNTER — Telehealth: Payer: Self-pay | Admitting: *Deleted

## 2013-02-12 NOTE — Telephone Encounter (Signed)
02/12/2013 Call from pt today, am and wanted Dr. Raynald Kemp to know she has been having a lot of pain in bottom of both feet at the front of foot. Feet are painful when wearing house slippers. Pt wanted Dr. Raynald Kemp to know this before her surgery next week. Pt request a call back today.

## 2013-02-15 ENCOUNTER — Ambulatory Visit: Payer: Federal, State, Local not specified - PPO | Admitting: Podiatry

## 2013-02-17 ENCOUNTER — Encounter: Payer: Self-pay | Admitting: Podiatry

## 2013-02-17 ENCOUNTER — Ambulatory Visit: Payer: Federal, State, Local not specified - PPO | Admitting: Podiatry

## 2013-02-17 ENCOUNTER — Ambulatory Visit (INDEPENDENT_AMBULATORY_CARE_PROVIDER_SITE_OTHER): Payer: Federal, State, Local not specified - PPO | Admitting: Podiatry

## 2013-02-17 DIAGNOSIS — M21619 Bunion of unspecified foot: Secondary | ICD-10-CM

## 2013-02-17 DIAGNOSIS — M2010 Hallux valgus (acquired), unspecified foot: Secondary | ICD-10-CM

## 2013-02-17 DIAGNOSIS — M79609 Pain in unspecified limb: Secondary | ICD-10-CM

## 2013-02-17 NOTE — Patient Instructions (Signed)
Seen for pain in right foot. Possible due to abnormal biomechanics of foot.  See no contraindication for the proposed surgery, [place] bunionectomy right foot.

## 2013-02-17 NOTE — Progress Notes (Signed)
Subjective: Patient has had acute pain on right foot a few days ago. The pain was relieved by taking OTC Advil. Patient points out plantar surface of the right foot being the area of pain. Patient wants to know if the surgery will be still go through as scheduled.  Objective; No abnormal edema or erythema noted.  No change in large bunion right.  Elevated first ray upon loading of fore foot bilateral.  Some tightness on ankle dorsiflexion.   Assessment; Acute episode of right foot pain that has been resolved by taking OTC Ibuprofen. Right bunion.  Plan: Noted of no contra indication for the proposed surgery, [place] bunionectomy. Findings reviewed with the patient.

## 2013-02-18 DIAGNOSIS — M2010 Hallux valgus (acquired), unspecified foot: Secondary | ICD-10-CM

## 2013-02-18 HISTORY — PX: BUNIONECTOMY: SHX129

## 2013-02-23 ENCOUNTER — Ambulatory Visit (INDEPENDENT_AMBULATORY_CARE_PROVIDER_SITE_OTHER): Payer: Federal, State, Local not specified - PPO | Admitting: Podiatry

## 2013-02-23 ENCOUNTER — Encounter: Payer: Self-pay | Admitting: Podiatry

## 2013-02-23 VITALS — BP 133/76 | HR 73

## 2013-02-23 DIAGNOSIS — Z9889 Other specified postprocedural states: Secondary | ICD-10-CM

## 2013-02-23 DIAGNOSIS — M79609 Pain in unspecified limb: Secondary | ICD-10-CM

## 2013-02-23 DIAGNOSIS — M21619 Bunion of unspecified foot: Secondary | ICD-10-CM

## 2013-02-23 DIAGNOSIS — M2010 Hallux valgus (acquired), unspecified foot: Secondary | ICD-10-CM

## 2013-02-23 NOTE — Patient Instructions (Signed)
Seen for post op wound check. Wound is healing well. Keep it dry. Continue to stay off and do range of motion exercise. Return in one week.

## 2013-02-23 NOTE — Progress Notes (Signed)
Post op wound. Stated that she is doing fine with minimum pain. Dressing intact. Wound is clean and dry. Post op x-ray show good bone to bone contact with internal fixation in place. Dressing changed. Informed of toe range of motion exercise. Return in one week.

## 2013-03-02 ENCOUNTER — Encounter: Payer: Federal, State, Local not specified - PPO | Admitting: Podiatry

## 2013-03-02 ENCOUNTER — Ambulatory Visit (INDEPENDENT_AMBULATORY_CARE_PROVIDER_SITE_OTHER): Payer: Federal, State, Local not specified - PPO | Admitting: Podiatry

## 2013-03-02 ENCOUNTER — Encounter: Payer: Self-pay | Admitting: Podiatry

## 2013-03-02 VITALS — BP 125/79 | HR 81

## 2013-03-02 DIAGNOSIS — Z9889 Other specified postprocedural states: Secondary | ICD-10-CM

## 2013-03-02 NOTE — Progress Notes (Signed)
Status post 2 weeks [place] bunionectomy right foot. Doing well. Suture removed. Compression bandage applied.  Continue do range of motion exercise.  May drive automobile if feel comfortable after practicing in neighborhood area.  Return in 2 weeks.

## 2013-03-02 NOTE — Patient Instructions (Signed)
Post op care. Continue do range of motion exercise. Return in 2 weeks.

## 2013-03-08 ENCOUNTER — Encounter: Payer: Federal, State, Local not specified - PPO | Admitting: Podiatry

## 2013-03-09 ENCOUNTER — Encounter: Payer: Self-pay | Admitting: Podiatry

## 2013-03-09 ENCOUNTER — Ambulatory Visit (INDEPENDENT_AMBULATORY_CARE_PROVIDER_SITE_OTHER): Payer: Federal, State, Local not specified - PPO | Admitting: Podiatry

## 2013-03-09 VITALS — BP 140/87 | HR 75

## 2013-03-09 DIAGNOSIS — Z9889 Other specified postprocedural states: Secondary | ICD-10-CM

## 2013-03-09 NOTE — Progress Notes (Signed)
Stated that she is ok overall. She is having some pain under the 5th MPJ right, also having pain under the big toe.   Objective: No edema or erythema.  Well coapted surgical site with good range of motion.   Assessment: Pain under lateral column from flat surgical shoes. Post op wound healing is satisfactory.  Plan: May return to regular tennis shoes. Use toe spacer as tolerated.  Right post op wound healed well enough to have the left foot bunion corrected.  Patient will inform was with her desired surgery date.

## 2013-03-09 NOTE — Patient Instructions (Signed)
Post op wound doing well.

## 2013-03-16 ENCOUNTER — Encounter: Payer: Federal, State, Local not specified - PPO | Admitting: Podiatry

## 2013-03-17 ENCOUNTER — Ambulatory Visit (INDEPENDENT_AMBULATORY_CARE_PROVIDER_SITE_OTHER): Payer: Federal, State, Local not specified - PPO | Admitting: Podiatry

## 2013-03-17 ENCOUNTER — Encounter: Payer: Self-pay | Admitting: Podiatry

## 2013-03-17 DIAGNOSIS — M79609 Pain in unspecified limb: Secondary | ICD-10-CM

## 2013-03-17 DIAGNOSIS — M21969 Unspecified acquired deformity of unspecified lower leg: Secondary | ICD-10-CM

## 2013-03-17 DIAGNOSIS — M21619 Bunion of unspecified foot: Secondary | ICD-10-CM

## 2013-03-17 DIAGNOSIS — M79673 Pain in unspecified foot: Secondary | ICD-10-CM

## 2013-03-17 NOTE — Progress Notes (Signed)
Post op visit. Status post 4 weeks since left foot bunion surgery was done Kristie King).  She is walking in tennis shoes. Still some tenderness felt under the big joint and swelling is down.  She is ready to have Orthotics prepared and wants to have the left foot bunion corrected. Patient will contact us with the date.

## 2013-03-17 NOTE — Patient Instructions (Signed)
Post op check. Doing well. Both feet casted for Orthotics.

## 2013-03-23 ENCOUNTER — Telehealth: Payer: Self-pay | Admitting: *Deleted

## 2013-03-23 NOTE — Telephone Encounter (Signed)
03/23/2013 Patient called this morning to schedule her surgery for next Thursday the 29th, She had a question, someone told her you should wait until foot heals before  She has another foot surgery and she was asking  Was this true, I told her Dr. Caffie Pinto wouldn't have scheduled this if she didn't think she was ready,  I told her she could talk to Dr. Caffie Pinto or confirm on date of surgery.

## 2013-04-01 DIAGNOSIS — M2010 Hallux valgus (acquired), unspecified foot: Secondary | ICD-10-CM

## 2013-04-01 HISTORY — PX: BUNIONECTOMY: SHX129

## 2013-04-02 ENCOUNTER — Other Ambulatory Visit: Payer: Self-pay | Admitting: Podiatry

## 2013-04-06 ENCOUNTER — Ambulatory Visit (INDEPENDENT_AMBULATORY_CARE_PROVIDER_SITE_OTHER): Payer: Federal, State, Local not specified - PPO | Admitting: Podiatry

## 2013-04-06 ENCOUNTER — Encounter: Payer: Self-pay | Admitting: Podiatry

## 2013-04-06 VITALS — BP 140/80 | HR 73

## 2013-04-06 DIAGNOSIS — Z9889 Other specified postprocedural states: Secondary | ICD-10-CM

## 2013-04-06 NOTE — Progress Notes (Signed)
First post op visit on left bunion surgery. Right foot doing well following the surgery, 7 weeks post op. Patient is having minimum pain. Wound is clean and dry without edema or erythema. Dressing change done. Return in one week for suture removal.

## 2013-04-06 NOTE — Patient Instructions (Signed)
Good post op wound healing. Continue light activity. Return in one week.

## 2013-04-13 ENCOUNTER — Ambulatory Visit (INDEPENDENT_AMBULATORY_CARE_PROVIDER_SITE_OTHER): Payer: Federal, State, Local not specified - PPO | Admitting: Podiatry

## 2013-04-13 ENCOUNTER — Encounter: Payer: Self-pay | Admitting: Podiatry

## 2013-04-13 VITALS — BP 166/95 | HR 78

## 2013-04-13 DIAGNOSIS — Z9889 Other specified postprocedural states: Secondary | ICD-10-CM

## 2013-04-13 NOTE — Progress Notes (Signed)
Post op 2 weeks since Kristie King on left. Doing good. Suture removed.  No edema or erythema. Wound dry and well coapted. Compression stockinet dispensed. Return in 3 weeks.

## 2013-04-13 NOTE — Patient Instructions (Signed)
Doing well. Suture removed. Return in 3 weeks.

## 2013-04-14 ENCOUNTER — Telehealth: Payer: Self-pay | Admitting: *Deleted

## 2013-04-14 NOTE — Telephone Encounter (Signed)
Dr. Caffie Pinto , Call from patient, She ask does she have to wear the compression bandage you put on her foot yesterday. She tried to wear it last night and had to take it off. Also, a pad or something between her toes does she wear that at nighttime or daytime.?

## 2013-04-26 ENCOUNTER — Encounter: Payer: Federal, State, Local not specified - PPO | Admitting: Podiatry

## 2013-05-04 ENCOUNTER — Encounter: Payer: Self-pay | Admitting: Podiatry

## 2013-05-04 ENCOUNTER — Ambulatory Visit (INDEPENDENT_AMBULATORY_CARE_PROVIDER_SITE_OTHER): Payer: Federal, State, Local not specified - PPO | Admitting: Podiatry

## 2013-05-04 VITALS — BP 145/83 | HR 69

## 2013-05-04 DIAGNOSIS — Z9889 Other specified postprocedural states: Secondary | ICD-10-CM

## 2013-05-04 NOTE — Progress Notes (Signed)
Status post 5 weeks on left and 11 weeks on right post [place] bunionectomy. Patient is doing well. Minimum edema on left. Suture site well healed with minimum scar. Good range of motion at the first MPJ bilateral.  Assessment: Satisfactory recovery following bilateral bunionectomy.   Plan: Exercise active range of motion at that first MPJ. May wear regular shoes on both feet and increase activity. Will follow up again in 3 weeks.

## 2013-05-04 NOTE — Patient Instructions (Signed)
Status post bilateral bunion surgery. Doing well with minimum discomfort.  May try regular tennis shoes and increase activity.

## 2013-05-24 ENCOUNTER — Telehealth: Payer: Self-pay | Admitting: *Deleted

## 2013-05-24 NOTE — Telephone Encounter (Signed)
Dr. Caffie Pinto, [place] called this afternoon to verify if she has an appointment which she didn't.. She ask Can she wear her dress shoes , can she exercise and can she travel.

## 2013-05-31 ENCOUNTER — Telehealth: Payer: Self-pay | Admitting: *Deleted

## 2013-05-31 NOTE — Telephone Encounter (Signed)
Pt left message today 05/31/13 and states her left foot is still sensitive and her great Left toe is very stiff and painful @ times. Pt wants to know is there any meds she could use or could she do physical therapy? Ask that I call her back with information after I talk to Dr. Caffie Pinto.

## 2013-06-01 ENCOUNTER — Ambulatory Visit (INDEPENDENT_AMBULATORY_CARE_PROVIDER_SITE_OTHER): Payer: Federal, State, Local not specified - PPO | Admitting: Podiatry

## 2013-06-01 ENCOUNTER — Encounter: Payer: Self-pay | Admitting: Podiatry

## 2013-06-01 VITALS — BP 158/85 | HR 69

## 2013-06-01 DIAGNOSIS — Z9889 Other specified postprocedural states: Secondary | ICD-10-CM

## 2013-06-01 DIAGNOSIS — M21619 Bunion of unspecified foot: Secondary | ICD-10-CM

## 2013-06-01 NOTE — Patient Instructions (Signed)
Post op doing well with right foot. Left foot is stiff and painful at the joint. Please continue with active range of motion exercise on the first big joint left foot. Return with any concerns in future.

## 2013-06-01 NOTE — Progress Notes (Signed)
Subjective: 3 months right foot, 2 month left foot bunionectomy.  Post op follow up. Doing well with right foot. Left foot is having stiffness with occasional pain under the big joint. Patient is walking in dress shoes.  Objective: Range of motion dorsiflexion at the first MPJ is about 30 degree. No plantar flexion motion available.  No edema or erythema noted.  Pain at the end range motion of the first MPJ.   Assessment: Post operative joint stiffness.  Plan: Reviewed end range motion exercise.  May use OTC cream for surgical scar as needed.  Return with any future concerns.

## 2013-08-18 ENCOUNTER — Other Ambulatory Visit (HOSPITAL_COMMUNITY): Payer: Self-pay | Admitting: Pulmonary Disease

## 2013-08-18 DIAGNOSIS — R42 Dizziness and giddiness: Secondary | ICD-10-CM

## 2013-08-19 ENCOUNTER — Ambulatory Visit (HOSPITAL_COMMUNITY)
Admission: RE | Admit: 2013-08-19 | Discharge: 2013-08-19 | Disposition: A | Discharge status: Home / Self Care | Payer: Federal, State, Local not specified - PPO | Source: Ambulatory Visit | Attending: Pulmonary Disease | Admitting: Pulmonary Disease

## 2013-08-19 DIAGNOSIS — R42 Dizziness and giddiness: Secondary | ICD-10-CM

## 2013-08-19 NOTE — Progress Notes (Signed)
*  PRELIMINARY RESULTS* Vascular Ultrasound Carotid Duplex (Doppler) has been completed.  Preliminary findings: Bilateral:  1-39% ICA stenosis.  Vertebral artery flow is antegrade.      Landry Mellow, RDMS, RVT  08/19/2013, 1:05 PM

## 2013-08-25 ENCOUNTER — Ambulatory Visit (INDEPENDENT_AMBULATORY_CARE_PROVIDER_SITE_OTHER): Payer: Federal, State, Local not specified - PPO | Admitting: Emergency Medicine

## 2013-08-25 VITALS — BP 128/82 | HR 58 | Temp 98.5°F | Resp 16 | Ht 63.75 in | Wt 158.2 lb

## 2013-08-25 DIAGNOSIS — M791 Myalgia, unspecified site: Secondary | ICD-10-CM

## 2013-08-25 DIAGNOSIS — IMO0001 Reserved for inherently not codable concepts without codable children: Secondary | ICD-10-CM

## 2013-08-25 LAB — CBC WITH DIFFERENTIAL/PLATELET
BASOS ABS: 0.1 10*3/uL (ref 0.0–0.1)
BASOS PCT: 1 % (ref 0–1)
Eosinophils Absolute: 0.2 10*3/uL (ref 0.0–0.7)
Eosinophils Relative: 3 % (ref 0–5)
HCT: 32.2 % — ABNORMAL LOW (ref 36.0–46.0)
HEMOGLOBIN: 10.8 g/dL — AB (ref 12.0–15.0)
Lymphocytes Relative: 45 % (ref 12–46)
Lymphs Abs: 3.3 10*3/uL (ref 0.7–4.0)
MCH: 28.6 pg (ref 26.0–34.0)
MCHC: 33.5 g/dL (ref 30.0–36.0)
MCV: 85.2 fL (ref 78.0–100.0)
MONOS PCT: 9 % (ref 3–12)
Monocytes Absolute: 0.7 10*3/uL (ref 0.1–1.0)
NEUTROS ABS: 3.1 10*3/uL (ref 1.7–7.7)
NEUTROS PCT: 42 % — AB (ref 43–77)
Platelets: 341 10*3/uL (ref 150–400)
RBC: 3.78 MIL/uL — ABNORMAL LOW (ref 3.87–5.11)
RDW: 14.5 % (ref 11.5–15.5)
WBC: 7.4 10*3/uL (ref 4.0–10.5)

## 2013-08-25 NOTE — Patient Instructions (Signed)
Muscle Strain °A muscle strain is an injury that occurs when a muscle is stretched beyond its normal length. Usually a small number of muscle fibers are torn when this happens. Muscle strain is rated in degrees. First-degree strains have the least amount of muscle fiber tearing and pain. Second-degree and third-degree strains have increasingly more tearing and pain.  °Usually, recovery from muscle strain takes 1-2 weeks. Complete healing takes 5-6 weeks.  °CAUSES  °Muscle strain happens when a sudden, violent force placed on a muscle stretches it too far. This may occur with lifting, sports, or a fall.  °RISK FACTORS °Muscle strain is especially common in athletes.  °SIGNS AND SYMPTOMS °At the site of the muscle strain, there may be: °· Pain. °· Bruising. °· Swelling. °· Difficulty using the muscle due to pain or lack of normal function. °DIAGNOSIS  °Your health care Arianne Klinge will perform a physical exam and ask about your medical history. °TREATMENT  °Often, the best treatment for a muscle strain is resting, icing, and applying cold compresses to the injured area.   °HOME CARE INSTRUCTIONS  °· Use the PRICE method of treatment to promote muscle healing during the first 2-3 days after your injury. The PRICE method involves: °¨ Protecting the muscle from being injured again. °¨ Restricting your activity and resting the injured body part. °¨ Icing your injury. To do this, put ice in a plastic bag. Place a towel between your skin and the bag. Then, apply the ice and leave it on from 15-20 minutes each hour. After the third day, switch to moist heat packs. °¨ Apply compression to the injured area with a splint or elastic bandage. Be careful not to wrap it too tightly. This may interfere with blood circulation or increase swelling. °¨ Elevate the injured body part above the level of your heart as often as you can. °· Only take over-the-counter or prescription medicines for pain, discomfort, or fever as directed by your  health care Akela Pocius. °· Warming up prior to exercise helps to prevent future muscle strains. °SEEK MEDICAL CARE IF:  °· You have increasing pain or swelling in the injured area. °· You have numbness, tingling, or a significant loss of strength in the injured area. °MAKE SURE YOU:  °· Understand these instructions. °· Will watch your condition. °· Will get help right away if you are not doing well or get worse. °Document Released: 02/18/2005 Document Revised: 12/09/2012 Document Reviewed: 09/17/2012 °[employer]® Patient Information ©2015 [employer]. This information is not intended to replace advice given to you by your health care Marquisha Nikolov. Make sure you discuss any questions you have with your health care Macdonald Rigor. ° °

## 2013-08-25 NOTE — Progress Notes (Signed)
[facility] [address], [place] [address] [phone]  Date:  08/25/2013   Name:  Kristie King   DOB:  [birth year]   MRN:  [id number]  PCP:  Leola Brazil, MD    Chief Complaint: Knee Pain   History of Present Illness:  BRINDLEY MADARANG is a 63 y.o. very pleasant female patient who presents with the following:  Started zumba 8 days ago and has one week history of pain in lower legs and is concerned by dysymmetry in the knees.  Has pain at times in the calfs and no history of ecchymosis, swelling or limitation of motion.  No improvement with over the counter medications or other home remedies. Denies other complaint or health concern today.   Patient Active Problem List   Diagnosis Date Noted  . Status post foot surgery 02/23/2013  . Bunion 02/10/2013  . Pain, foot 02/10/2013  . ARF (acute renal failure) 09/17/2012  . General weakness 09/17/2012  . Dyslipidemia 09/17/2012  . HTN (hypertension) 09/17/2012  . Diabetes mellitus without complication     Past Medical History  Diagnosis Date  . Dyslipidemia 09/17/2012  . HTN (hypertension) 09/17/2012  . Type II diabetes mellitus   . Arthritis     "knees; elbows" (09/17/2012)    Past Surgical History  Procedure Laterality Date  . Bunionectomy Right 02/18/13    [place] Bunionectomy @ [place]  . Bunionectomy Left 04/01/2013    [place] Bunion[facility] @[facility]     History  Substance Use Topics  . Smoking status: Former Smoker -- 31 years    Types: Cigarettes    Quit date: 01/14/2013  . Smokeless tobacco: Never Used  . Alcohol Use: Yes     Comment: 09/17/2012 "drink on special occasions only"    Family History  Problem Relation Age of Onset  . Hyperlipidemia Sister   . Diabetes Brother   . Hypertension Brother   . Diabetes Sister   . Hypertension Sister     No Known Allergies  Medication list has been reviewed and updated.  Current Outpatient Prescriptions on File Prior to Visit  Medication Sig  Dispense Refill  . amLODipine (NORVASC) 10 MG tablet Take 10 mg by mouth. Half tab on Mon, Wed, and Fri.      Marland Kitchen aspirin EC 81 MG tablet Take 81 mg by mouth daily.       . Calcium Carbonate-Vitamin D (OSCAL 500/200 D-3 PO) Take 1 tablet by mouth daily.      . Choline Fenofibrate (TRILIPIX) 135 MG capsule Take 135 mg by mouth daily.       . fluticasone (VERAMYST) 27.5 MCG/SPRAY nasal spray Place 2 sprays into both nostrils as needed.       . furosemide (LASIX) 40 MG tablet Take 20 mg by mouth daily.       . predniSONE (DELTASONE) 5 MG tablet       . rosuvastatin (CRESTOR) 10 MG tablet Take 10 mg by mouth daily.      . sitaGLIPtan-metformin (JANUMET) 50-1000 MG per tablet Take 1 tablet by mouth daily.      Marland Kitchen azithromycin (ZITHROMAX) 250 MG tablet       . HYDROcodone-acetaminophen (NORCO) 7.5-325 MG per tablet Take 1 tablet by mouth every 6 (six) hours as needed.       Marland Kitchen HYDROcodone-homatropine (HYCODAN) 5-1.5 MG/5ML syrup       . nabumetone (RELAFEN) 500 MG tablet Take 500 mg by mouth 2 (two) times daily  as needed.       . Omega-3 Fatty Acids (FISH OIL) 1000 MG CAPS Take 1 capsule by mouth daily.        No current facility-administered medications on file prior to visit.    Review of Systems:  As per HPI, otherwise negative.    Physical Examination: Filed Vitals:   08/25/13 1327  BP: 128/82  Pulse: 58  Temp: 98.5 F (36.9 C)  Resp: 16   Filed Vitals:   08/25/13 1327  Height: 5' 3.75" (1.619 m)  Weight: 158 lb 3.2 oz (71.759 kg)   Body mass index is 27.38 kg/(m^2). Ideal Body Weight: Weight in (lb) to have BMI = 25: 144.2   GEN: WDWN, NAD, Non-toxic, Alert & Oriented x 3 HEENT: Atraumatic, Normocephalic.  Ears and Nose: No external deformity. EXTR: No clubbing/cyanosis/edema NEURO: Normal gait.  PSYCH: Normally interactive. Conversant. Not depressed or anxious appearing.  Calm demeanor.  LEGS:  Not tender, symmetrical and no ecchymosis or deformity.  Warm.   NATI   Assessment and Plan: Muscle strain Tylenol Labs  Signed,  Ellison Carwin, MD

## 2013-08-26 LAB — COMPREHENSIVE METABOLIC PANEL
AST: 16 U/L (ref 0–37)
Albumin: 4.2 g/dL (ref 3.5–5.2)
Alkaline Phosphatase: 47 U/L (ref 39–117)
BUN: 25 mg/dL — AB (ref 6–23)
CALCIUM: 10.1 mg/dL (ref 8.4–10.5)
CHLORIDE: 106 meq/L (ref 96–112)
CO2: 27 meq/L (ref 19–32)
CREATININE: 1.16 mg/dL — AB (ref 0.50–1.10)
GLUCOSE: 100 mg/dL — AB (ref 70–99)
Potassium: 4.5 mEq/L (ref 3.5–5.3)
Sodium: 139 mEq/L (ref 135–145)
Total Bilirubin: 0.3 mg/dL (ref 0.2–1.2)
Total Protein: 6.8 g/dL (ref 6.0–8.3)

## 2013-08-26 LAB — CK: CK TOTAL: 51 U/L (ref 7–177)

## 2013-09-01 ENCOUNTER — Encounter: Payer: Self-pay | Admitting: Family Medicine

## 2013-09-01 ENCOUNTER — Ambulatory Visit (INDEPENDENT_AMBULATORY_CARE_PROVIDER_SITE_OTHER): Payer: Federal, State, Local not specified - PPO | Admitting: Family Medicine

## 2013-09-01 ENCOUNTER — Other Ambulatory Visit (INDEPENDENT_AMBULATORY_CARE_PROVIDER_SITE_OTHER): Payer: Federal, State, Local not specified - PPO

## 2013-09-01 VITALS — BP 140/82 | HR 63 | Ht 62.0 in | Wt 158.0 lb

## 2013-09-01 DIAGNOSIS — M25561 Pain in right knee: Secondary | ICD-10-CM

## 2013-09-01 DIAGNOSIS — M6580 Other synovitis and tenosynovitis, unspecified site: Secondary | ICD-10-CM

## 2013-09-01 DIAGNOSIS — M25569 Pain in unspecified knee: Secondary | ICD-10-CM

## 2013-09-01 DIAGNOSIS — M659 Synovitis and tenosynovitis, unspecified: Secondary | ICD-10-CM | POA: Insufficient documentation

## 2013-09-01 NOTE — Assessment & Plan Note (Signed)
Patient was given injection today. We discussed icing protocol as well as patient was given a brace was fitted by me today. We discussed home exercise program to do regularly and would activity to avoid. Patient was then come back again in 3 weeks for further evaluation and treatment.

## 2013-09-01 NOTE — Progress Notes (Signed)
[name] [place], [place] [address] Phone: [phone] Subjective:     CC: Right knee pain  [id number] Kristie King is a 63 y.o. female coming in with complaint of  right knee pain. Patient states that it only hurt for approximately 1 week. Patient was in a exercise class and noticed some discomfort. Patient does have a past medical history she states of some arthritis. Patient states that she had rheumatoid arthritis and was seen by [facility]. Patient was on methotrexate for quite some time but is now only on anti-inflammatories. Patient feels that this knee is swelling and she is unable to bend it quite so well. States that it does give her a catching sensation and does feel like he can get out. Denies any radiation of the leg or any numbness. Patient the severity of 8/10. Patient likes to remain active but has been unable to do any exercises secondary to pain. Patient was cemented in care physician and was given anti-inflammatories and keeping instruction with no significant permanent.     Past medical history, social, surgical and family history all reviewed in electronic medical record.   Review of Systems: No headache, visual changes, nausea, vomiting, diarrhea, constipation, dizziness, abdominal pain, skin rash, fevers, chills, night sweats, weight loss, swollen lymph nodes, body aches, joint swelling, muscle aches, chest pain, shortness of breath, mood changes.   Objective Blood pressure 140/82, pulse 63, height 5\' 2"  (1.575 m), weight 158 lb (71.668 kg), SpO2 99.00%.  General: No apparent distress alert and oriented x3 mood and affect normal, dressed appropriately.  HEENT: Pupils equal, extraocular movements intact  Respiratory: Patient's speak in full sentences and does not appear short of breath  Cardiovascular: No lower extremity edema, non tender, no erythema  Skin: Warm dry intact with no signs of infection or rash on extremities or on  axial skeleton.  Abdomen: Soft nontender  Neuro: Cranial nerves II through XII are intact, neurovascularly intact in all extremities with 2+ DTRs and 2+ pulses.  Lymph: No lymphadenopathy of posterior or anterior cervical chain or axillae bilaterally.  Gait normal with good balance and coordination.  MSK:  Non tender with full range of motion and good stability and symmetric strength and tone of shoulders, elbows, wrist, hip, and ankles bilaterally.  Knee: Right On inspection patient does have effusion of the knee joint. Generalized tenderness but more over the medial joint line ROM full in flexion and extension and lower leg rotation. She does have some pain with full flexion the Ligaments with solid consistent endpoints including ACL, PCL, LCL, MCL. Mild positive Mcmurray's, Apley's, and Thessalonian tests. Non painful patellar compression. Patellar glide without crepitus. Patellar and quadriceps tendons unremarkable. Hamstring and quadriceps strength is normal.   MSK US performed of: Right knee This study was ordered, performed, and interpreted by Charlann Boxer D.O.  Knee: All structures visualized. She does have a large effusion as well as a synovitis of the knee. Anteromedial, anterolateral, posteromedial, and posterolateral menisci all visualized spelled minorly displacing due to patient having significant effusion. Patellar Tendon unremarkable on long and transverse views without effusion. No abnormality of prepatellar bursa. LCL and MCL unremarkable on long and transverse views. No abnormality of origin of medial or lateral head of the gastrocnemius.  IMPRESSION:  Synovitis with large effusion  Procedure: Real-time Ultrasound Guided Injection of right knee Device: GE Logiq E  Ultrasound guided injection is preferred based studies that show increased duration, increased effect, greater  accuracy, decreased procedural pain, increased response rate, and decreased cost with  ultrasound guided versus blind injection.  Verbal informed consent obtained.  Time-out conducted.  Noted no overlying erythema, induration, or other signs of local infection.  Skin prepped in a sterile fashion.  Local anesthesia: Topical Ethyl chloride.  With sterile technique and under real time ultrasound guidance: With a 22-gauge 2 inch needle patient did have removal of 50 cc of strawlike fluid removed. patient was injected with 4 cc of 0.5% Marcaine and 1 cc of Kenalog 40 mg/dL. This was from a superior lateral approach.  Completed without difficulty  Pain immediately resolved suggesting accurate placement of the medication.  Advised to call if fevers/chills, erythema, induration, drainage, or persistent bleeding.  Images permanently stored and available for review in the ultrasound unit.  Impression: Technically successful ultrasound guided injection.      Impression and Recommendations:     This case required medical decision making of moderate complexity.

## 2013-09-01 NOTE — Patient Instructions (Signed)
Good to meet you You have a synovitis that is likely secondary from the arthritis you have.  Injection today.  Ice 20 minutes 2 times daily.  Exercises 3 times a week and wear brace.  You can though ride a bike or swim as much as you want Avoid deep squats or twisting motions for next 3 weeks.  Come back in 3 weeks to make sure much better Can come sooner if swelling gets worse.

## 2013-09-02 ENCOUNTER — Telehealth: Payer: Self-pay | Admitting: *Deleted

## 2013-09-02 NOTE — Telephone Encounter (Signed)
Requesting to apeak with nurse. The knee brace that was given yesterday is too big...Kristie King

## 2013-09-02 NOTE — Telephone Encounter (Signed)
Spoke with pt, she states she will come by the office today to try a smaller brace.

## 2013-09-22 ENCOUNTER — Ambulatory Visit (INDEPENDENT_AMBULATORY_CARE_PROVIDER_SITE_OTHER): Payer: Federal, State, Local not specified - PPO | Admitting: Family Medicine

## 2013-09-22 ENCOUNTER — Encounter: Payer: Self-pay | Admitting: Family Medicine

## 2013-09-22 VITALS — BP 132/72 | HR 82 | Ht 63.0 in | Wt 155.0 lb

## 2013-09-22 DIAGNOSIS — M659 Synovitis and tenosynovitis, unspecified: Secondary | ICD-10-CM

## 2013-09-22 DIAGNOSIS — M6580 Other synovitis and tenosynovitis, unspecified site: Secondary | ICD-10-CM

## 2013-09-22 DIAGNOSIS — Z9889 Other specified postprocedural states: Secondary | ICD-10-CM

## 2013-09-22 NOTE — Assessment & Plan Note (Signed)
Resolve after injection. Patient likely did have a flare from her rheumatoid arthritis. We discussed continuing the icing regimen and starting to increase her activity. Patient knows if she has any worsening pain or swelling and she can come back immediately. We discussed that other medications such as anti-inflammatories as well as potentially steroids were made and it be necessary. Patient will come back and see me on an as-needed basis.

## 2013-09-22 NOTE — Patient Instructions (Signed)
Good to see you Kristie King still is your friend.  Good to go with exercises.  Shoe wise look for [name] or New balance greater than 700.  Do not lace last eye.  Come back again 4 weeks for orthotics if still having pain.

## 2013-09-22 NOTE — Assessment & Plan Note (Signed)
Patient did suggest that she is having some mild foot pain but we did not evaluate further. Patient has had surgery previously.

## 2013-09-22 NOTE — Progress Notes (Signed)
  [name] [place], [place] [address] Phone: [phone] Subjective:     CC: Right knee pain followup  [id number] Chelsea A Narayanan is a 63 y.o. female coming in with complaint of  right knee pain. She was found to have a severe synovitis previously of the right knee secondary to her rheumatoid arthritis. Patient states since the injection, bracing, home exercises and icing she is about 99% better. Patient states she is able to do all activities. Patient is looking forward to getting back to doing more exercises.     Past medical history, social, surgical and family history all reviewed in electronic medical record.   Review of Systems: No headache, visual changes, nausea, vomiting, diarrhea, constipation, dizziness, abdominal pain, skin rash, fevers, chills, night sweats, weight loss, swollen lymph nodes, body aches, joint swelling, muscle aches, chest pain, shortness of breath, mood changes.   Objective Blood pressure 132/72, pulse 82, height 5\' 3"  (1.6 m), weight 155 lb (70.308 kg), SpO2 98.00%.  General: No apparent distress alert and oriented x3 mood and affect normal, dressed appropriately.  HEENT: Pupils equal, extraocular movements intact  Respiratory: Patient's speak in full sentences and does not appear short of breath  Cardiovascular: No lower extremity edema, non tender, no erythema  Skin: Warm dry intact with no signs of infection or rash on extremities or on axial skeleton.  Abdomen: Soft nontender  Neuro: Cranial nerves II through XII are intact, neurovascularly intact in all extremities with 2+ DTRs and 2+ pulses.  Lymph: No lymphadenopathy of posterior or anterior cervical chain or axillae bilaterally.  Gait normal with good balance and coordination.  MSK:  Non tender with full range of motion and good stability and symmetric strength and tone of shoulders, elbows, wrist, hip, and ankles bilaterally.  Knee: Right No swelling  noted on exam Nontender on exam ROM full in flexion and extension and lower leg rotation. She does have some pain with full flexion the Ligaments with solid consistent endpoints including ACL, PCL, LCL, MCL. Negative Mcmurray's, Apley's, and Thessalonian tests. Non painful patellar compression. Patellar glide without crepitus. Patellar and quadriceps tendons unremarkable. Hamstring and quadriceps strength is normal.   MSK US performed of: Right knee This study was ordered, performed, and interpreted by Charlann Boxer D.O.  Knee: All structures visualized. Effusion has completely resolved Anteromedial, anterolateral, posteromedial, and posterolateral menisci normal. Patellar Tendon unremarkable on long and transverse views without effusion. No abnormality of prepatellar bursa. LCL and MCL unremarkable on long and transverse views. No abnormality of origin of medial or lateral head of the gastrocnemius.  IMPRESSION:  Resolve synovitis    Impression and Recommendations:     This case required medical decision making of moderate complexity.

## 2013-11-16 ENCOUNTER — Ambulatory Visit (INDEPENDENT_AMBULATORY_CARE_PROVIDER_SITE_OTHER): Payer: Federal, State, Local not specified - PPO | Admitting: Family Medicine

## 2013-11-16 VITALS — BP 124/72 | HR 69 | Ht 63.0 in | Wt 161.0 lb

## 2013-11-16 DIAGNOSIS — M79609 Pain in unspecified limb: Secondary | ICD-10-CM

## 2013-11-16 DIAGNOSIS — M216X9 Other acquired deformities of unspecified foot: Secondary | ICD-10-CM

## 2013-11-16 DIAGNOSIS — M79673 Pain in unspecified foot: Secondary | ICD-10-CM

## 2013-11-16 DIAGNOSIS — R269 Unspecified abnormalities of gait and mobility: Secondary | ICD-10-CM | POA: Insufficient documentation

## 2013-11-16 DIAGNOSIS — M216X2 Other acquired deformities of left foot: Secondary | ICD-10-CM | POA: Insufficient documentation

## 2013-11-16 DIAGNOSIS — Z9889 Other specified postprocedural states: Secondary | ICD-10-CM

## 2013-11-16 NOTE — Patient Instructions (Addendum)
Good to see you Ice bath 20 minutes 2 times daily.  Continue the lotion 2 times daily to help with the callus.  Exercises 3 times a week.  B12 1022mcg daily B6 200mg  daily I love the idea of the glucosamine Lace shoe differently and skip the 2nd eye.  Come back in 3 weeks and we will make orthotics.

## 2013-11-16 NOTE — Assessment & Plan Note (Signed)
Does have metatarsalgia secondary to the breakdown of her transverse arch. We are going to try changes with she lacing, home exercises, icing protocol, and over-the-counter natural supplementations. Patient will try these different interventions for the next 3 weeks. Patient and will follow up again with me at that time and is continuing to have pain we'll consider custom orthotics.

## 2013-11-17 ENCOUNTER — Encounter: Payer: Self-pay | Admitting: Family Medicine

## 2013-11-17 NOTE — Assessment & Plan Note (Signed)
Contributing to break down

## 2013-11-17 NOTE — Progress Notes (Signed)
  [name] [place], [place] [address] Phone: [phone] Subjective:     CC: Right knee pain followup  [id number] Kristie King is a 63 y.o. female coming in with complaint of  right knee pain. She was found to have a severe synovitis previously of the right knee secondary to her rheumatoid arthritis. Patient states since the injection, bracing, home exercises and icing she is about 99% better. Patient states she is able to do all activities. Patient is looking forward to getting back to doing more exercises.  The patient states though that she continues to have foot pain. Patient states that the pain is mostly on the balls of her feet and her toes. Worse with activity. Patient describes it is more of a burning sensation. Patient states that she's had the surgery on the MTP joint it has not felt well. Patient states she is unable to walk long distances secondary to this pain. No numbness or weakness.       Past medical history, social, surgical and family history all reviewed in electronic medical record.   Review of Systems: No headache, visual changes, nausea, vomiting, diarrhea, constipation, dizziness, abdominal pain, skin rash, fevers, chills, night sweats, weight loss, swollen lymph nodes, body aches, joint swelling, muscle aches, chest pain, shortness of breath, mood changes.   Objective Blood pressure 124/72, pulse 69, height 5\' 3"  (1.6 m), weight 161 lb (73.029 kg), SpO2 99.00%.  General: No apparent distress alert and oriented x3 mood and affect normal, dressed appropriately.  HEENT: Pupils equal, extraocular movements intact  Respiratory: Patient's speak in full sentences and does not appear short of breath  Cardiovascular: No lower extremity edema, non tender, no erythema  Skin: Warm dry intact with no signs of infection or rash on extremities or on axial skeleton.  Abdomen: Soft nontender  Neuro: Cranial nerves II through XII are  intact, neurovascularly intact in all extremities with 2+ DTRs and 2+ pulses.  Lymph: No lymphadenopathy of posterior or anterior cervical chain or axillae bilaterally.  Gait normal with good balance and coordination.  MSK:  Non tender with full range of motion and good stability and symmetric strength and tone of shoulders, elbows, wrist, hip, and ankles bilaterally.  Knee: Right No swelling noted on exam Nontender on exam ROM full in flexion and extension and lower leg rotation. She does have some pain with full flexion the Ligaments with solid consistent endpoints including ACL, PCL, LCL, MCL. Negative Mcmurray's, Apley's, and Thessalonian tests. Non painful patellar compression. Patellar glide without crepitus. Patellar and quadriceps tendons unremarkable. Hamstring and quadriceps strength is normal.   MSK US performed of: Right knee This study was ordered, performed, and interpreted by Charlann Boxer D.O.  Knee: All structures visualized. Effusion has completely resolved Anteromedial, anterolateral, posteromedial, and posterolateral menisci normal. Patellar Tendon unremarkable on long and transverse views without effusion. No abnormality of prepatellar bursa. LCL and MCL unremarkable on long and transverse views. No abnormality of origin of medial or lateral head of the gastrocnemius.  IMPRESSION:  Resolve synovitis  Foot exam shows that patient did have previous surgery on the first MTP. Patient does have breakdown of the transverse arch bilaterally left greater than right.    Impression and Recommendations:     This case required medical decision making of moderate complexity.

## 2013-11-19 ENCOUNTER — Ambulatory Visit (INDEPENDENT_AMBULATORY_CARE_PROVIDER_SITE_OTHER): Payer: Federal, State, Local not specified - PPO | Admitting: Family Medicine

## 2013-11-19 ENCOUNTER — Encounter: Payer: Self-pay | Admitting: Family Medicine

## 2013-11-19 ENCOUNTER — Ambulatory Visit: Payer: Federal, State, Local not specified - PPO | Admitting: Family Medicine

## 2013-11-19 ENCOUNTER — Other Ambulatory Visit: Payer: Federal, State, Local not specified - PPO

## 2013-11-19 VITALS — BP 120/80 | HR 93 | Ht 63.0 in | Wt 161.0 lb

## 2013-11-19 DIAGNOSIS — M65969 Unspecified synovitis and tenosynovitis, unspecified lower leg: Secondary | ICD-10-CM

## 2013-11-19 DIAGNOSIS — M659 Synovitis and tenosynovitis, unspecified: Secondary | ICD-10-CM

## 2013-11-19 DIAGNOSIS — M6580 Other synovitis and tenosynovitis, unspecified site: Secondary | ICD-10-CM

## 2013-11-19 MED ORDER — MELOXICAM 7.5 MG PO TABS: 7.5000 mg | ORAL_TABLET | Freq: Every day | ORAL | Status: DC

## 2013-11-19 NOTE — Progress Notes (Signed)
[name] [place], [place] [address] Phone: [phone] Subjective:     CC: Right knee pain followup  [id number] Kristie King is a 63 y.o. female coming in with complaint of  right knee pain. She was found to have a severe synovitis previously of the right knee secondary to her rheumatoid arthritis. Patient was seen in 3 days ago and was not having any pain. Patient had respond to her injection greater than 10 weeks ago and had been doing relatively well. Patient states over the course last 48 hours she has had increasing swelling and pain. Patient is going out of town tomorrow and wanted to be evaluated. Denies any clicking or popping is more of a dull aching and swelling. States that it feels very similar to what it did previously. Denies any fevers or chills or any abnormal weight loss.      Past medical history, social, surgical and family history all reviewed in electronic medical record.   Review of Systems: No headache, visual changes, nausea, vomiting, diarrhea, constipation, dizziness, abdominal pain, skin rash, fevers, chills, night sweats, weight loss, swollen lymph nodes, body aches, joint swelling, muscle aches, chest pain, shortness of breath, mood changes.   Objective Blood pressure 120/80, pulse 93, height 5\' 3"  (1.6 m), weight 161 lb (73.029 kg), SpO2 99.00%.  General: No apparent distress alert and oriented x3 mood and affect normal, dressed appropriately.  HEENT: Pupils equal, extraocular movements intact  Respiratory: Patient's speak in full sentences and does not appear short of breath  Cardiovascular: No lower extremity edema, non tender, no erythema  Skin: Warm dry intact with no signs of infection or rash on extremities or on axial skeleton.  Abdomen: Soft nontender  Neuro: Cranial nerves II through XII are intact, neurovascularly intact in all extremities with 2+ DTRs and 2+ pulses.  Lymph: No lymphadenopathy of  posterior or anterior cervical chain or axillae bilaterally.  Gait normal with good balance and coordination.  MSK:  Non tender with full range of motion and good stability and symmetric strength and tone of shoulders, elbows, wrist, hip, and ankles bilaterally.  Knee: Right Trace swelling noted which is a change from previous exam Nontender on exam ROM full in flexion and extension and lower leg rotation. She does have some pain with full flexion the Ligaments with solid consistent endpoints including ACL, PCL, LCL, MCL. Negative Mcmurray's, Apley's, and Thessalonian tests. Non painful patellar compression. Patellar glide without crepitus. Patellar and quadriceps tendons unremarkable. Hamstring and quadriceps strength is normal.  Contralateral knee unremarkable  MSK US performed of: Right knee This study was ordered, performed, and interpreted by Charlann Boxer D.O.  Knee: All structures visualized. Effusion is back in fairly large in the suprapatellar pouch with synovitis noted. Anteromedial, anterolateral, posteromedial, and posterolateral menisci normal. Patellar Tendon unremarkable on long and transverse views without effusion. No abnormality of prepatellar bursa. LCL and MCL unremarkable on long and transverse views. No abnormality of origin of medial or lateral head of the gastrocnemius.  IMPRESSION:  Recurrent synovitis of the right knee  Procedure: Real-time Ultrasound Guided Injection of right knee  Device: GE Logiq E  Ultrasound guided injection is preferred based studies that show increased duration, increased effect, greater accuracy, decreased procedural pain, increased response rate, and decreased cost with ultrasound guided versus blind injection.  Verbal informed consent obtained.  Time-out conducted.  Noted no overlying erythema, induration, or other signs of local infection.  Skin prepped in  a sterile fashion.  Local anesthesia: Topical Ethyl chloride.  With  sterile technique and under real time ultrasound guidance: With a 22-gauge 2 inch needle patient did have removal of 20 cc of strawlike fluid removed. patient was injected with 4 cc of 0.5% Marcaine and 1 cc of Kenalog 40 mg/dL. This was from a superior lateral approach.  Completed without difficulty  Pain immediately resolved suggesting accurate placement of the medication.  Advised to call if fevers/chills, erythema, induration, drainage, or persistent bleeding.  Images permanently stored and available for review in the ultrasound unit.  Impression: Technically successful ultrasound guided injection    Impression and Recommendations:     This case required medical decision making of moderate complexity.

## 2013-11-19 NOTE — Patient Instructions (Addendum)
Recurrent synovitis of the right knee I would see a rheumatologist when you can.  Ice when you need it to the knee Meloxicam daily for 3 days then only as needed Continue the exercises Come back in 3 weeks and if any pain before that call me and we will order an MRI.

## 2013-11-19 NOTE — Addendum Note (Signed)
Addended by: Douglass Rivers T on: 11/19/2013 12:53 PM   Modules accepted: Orders

## 2013-11-19 NOTE — Assessment & Plan Note (Addendum)
Patient did have a return aspiration today. Patient will start on anti-inflammatories as well. Patient is going out of town and did respond fairly well to the injection previously. The patient having recurrent inflammation I am concerned the patient rheumatoid arthritis could be worsening. Patient has not seen a rheumatologist in quite some time and encourage her to followup. Patient will continue with the exercises and the icing as well as bracing as needed. Patient will then followup again in 3-4 weeks. Continuing to have pain I would consider further imaging likely an MRI. Differential also includes possibly uric crystalline arthropathy but I think this is fairly uncommon for her. Patient's fluid will be sent to lab for further evaluation.  Spent greater than 25 minutes with patient face-to-face and had greater than 50% of counseling including as described above in assessment and plan.

## 2013-11-20 LAB — SYNOVIAL CELL COUNT + DIFF, W/ CRYSTALS
Crystals, Fluid: NONE SEEN
Eosinophils-Synovial: 0 % (ref 0–1)
Lymphocytes-Synovial Fld: 76 % — ABNORMAL HIGH (ref 0–20)
Monocyte/Macrophage: 18 % — ABNORMAL LOW (ref 50–90)
Neutrophil, Synovial: 6 % (ref 0–25)
WBC, Synovial: 435 cu mm — ABNORMAL HIGH (ref 0–200)

## 2013-12-06 ENCOUNTER — Ambulatory Visit: Payer: Federal, State, Local not specified - PPO | Admitting: Family Medicine

## 2013-12-06 DIAGNOSIS — Z0289 Encounter for other administrative examinations: Secondary | ICD-10-CM

## 2013-12-10 ENCOUNTER — Ambulatory Visit: Payer: Federal, State, Local not specified - PPO | Admitting: Family Medicine

## 2014-04-12 ENCOUNTER — Encounter: Payer: Self-pay | Admitting: Family Medicine

## 2014-04-12 ENCOUNTER — Ambulatory Visit (INDEPENDENT_AMBULATORY_CARE_PROVIDER_SITE_OTHER): Payer: Federal, State, Local not specified - PPO | Admitting: Family Medicine

## 2014-04-12 ENCOUNTER — Ambulatory Visit: Payer: Federal, State, Local not specified - PPO | Admitting: Family Medicine

## 2014-04-12 ENCOUNTER — Other Ambulatory Visit (INDEPENDENT_AMBULATORY_CARE_PROVIDER_SITE_OTHER): Payer: Federal, State, Local not specified - PPO

## 2014-04-12 VITALS — BP 136/80 | HR 101 | Ht 63.0 in | Wt 157.0 lb

## 2014-04-12 DIAGNOSIS — M75100 Unspecified rotator cuff tear or rupture of unspecified shoulder, not specified as traumatic: Secondary | ICD-10-CM | POA: Insufficient documentation

## 2014-04-12 DIAGNOSIS — M75102 Unspecified rotator cuff tear or rupture of left shoulder, not specified as traumatic: Secondary | ICD-10-CM

## 2014-04-12 DIAGNOSIS — M25512 Pain in left shoulder: Secondary | ICD-10-CM

## 2014-04-12 NOTE — Patient Instructions (Addendum)
Good to see you Ice 20 minutes 2 times daily. Usually after activity and before bed. Exercises 3 times a week.  Try the pennsaid twice daily.  Continue the vitamins Keep hands within peripheral vision With shoes try rigid  Bottom so you cannot bend See me again in 3 weeks.

## 2014-04-12 NOTE — Progress Notes (Signed)
[facility] [address], [place] [address] Phone: [phone] Subjective:      CC: upper arm pain. Left side  IHK:[id number] Kristie King is a 64 y.o. female coming in with complaint of left-sided upper arm pain. Patient does have a past [place] history significant for rheumatoid arthritis.patient states 5 weeks ago patient was walking at night and bumped into a door. Since that time she has had a dull aching sensation. States that certain movements make her feel like she has lost some range of motion as well as some weakness. Patient denies any radiation down the arm or any numbness or [place]. Denies any associated neck pain. Describes it once again as dull aching more than anything else. Patient puts the severity of 6 out of 10. States that if she rolls on that side at night it can be very painful. Tried some home over-the-counter medicines with minimal improving.     Past medical history, social, surgical and family history all reviewed in electronic medical record.   Review of Systems: No headache, visual changes, nausea, vomiting, diarrhea, constipation, dizziness, abdominal pain, skin rash, fevers, chills, night sweats, weight loss, swollen lymph nodes, body aches, joint swelling, muscle aches, chest pain, shortness of breath, mood changes.   Objective Blood pressure 136/80, pulse 101, height 5\' 3"  (1.6 m), weight 157 lb (71.215 kg), SpO2 92 %.  General: No apparent distress alert and oriented x3 mood and affect normal, dressed appropriately.  HEENT: Pupils equal, extraocular movements intact  Respiratory: Patient's speak in full sentences and does not appear short of breath  Cardiovascular: No lower extremity edema, non tender, no erythema  Skin: Warm dry intact with no signs of infection or rash on extremities or on axial skeleton.  Abdomen: Soft nontender  Neuro: Cranial nerves II through XII are intact, neurovascularly intact in all  extremities with 2+ DTRs and 2+ pulses.  Lymph: No lymphadenopathy of posterior or anterior cervical chain or axillae bilaterally.  Gait normal with good balance and coordination.  MSK:  Non tender with full range of motion and good stability and symmetric strength and tone of shoulders, elbows, wrist, hip, knee and ankles bilaterally.  Shoulder: left Inspection reveals no abnormalities, atrophy or asymmetry. Palpation is normal with no tenderness over AC joint or bicipital groove. ROM is full in all planes passively. Rotator cuff strength normal throughout. signs of impingement with positive Neer and Hawkin's tests, but negative empty can sign. Speeds and Yergason's tests normal. No labral pathology noted with negative Obrien's, negative clunk and good stability. Normal scapular function observed. No painful arc and no drop arm sign. No apprehension sign  MSK US performed of: left This study was ordered, performed, and interpreted by Charlann Boxer D.O.  Shoulder:   Supraspinatus:  Patient did have an articular side tear that seems to be healing. Patient does have 0.22 cm of retraction still noted. Hypoechoic changes surrounding the area., Bursal bulge seen with shoulder abduction on impingement view. Infraspinatus:  Appears normal on long and transverse views. Significant increase in Doppler flow Subscapularis:  Appears normal on long and transverse views. Positive bursa Teres Minor:  Appears normal on long and transverse views. AC joint:  Capsule undistended, no geyser sign. Glenohumeral Joint:  Appears normal without effusion. Glenoid Labrum:  Intact without visualized tears. Biceps Tendon:  Appears normal on long and transverse views, no fraying of tendon, tendon located in intertubercular groove, no subluxation with shoulder internal or external rotation.  Impression: Subacromial bursitis, 30% tear of supraspinatus articular side that is improving.  Procedure: Real-time Ultrasound  Guided Injection of left glenohumeral joint Device: GE Logiq E  Ultrasound guided injection is preferred based studies that show increased duration, increased effect, greater accuracy, decreased procedural pain, increased response rate with ultrasound guided versus blind injection.  Verbal informed consent obtained.  Time-out conducted.  Noted no overlying erythema, induration, or other signs of local infection.  Skin prepped in a sterile fashion.  Local anesthesia: Topical Ethyl chloride.  With sterile technique and under real time ultrasound guidance:  Joint visualized.  23g 1  inch needle inserted posterior approach. Pictures taken for needle placement. Patient did have injection of 2 cc of 1% lidocaine, 2 cc of 0.5% Marcaine, and 1.0 cc of Kenalog 40 mg/dL. Completed without difficulty  Pain immediately resolved suggesting accurate placement of the medication.  Advised to call if fevers/chills, erythema, induration, drainage, or persistent bleeding.  Images permanently stored and available for review in the ultrasound unit.  Impression: Technically successful ultrasound guided injection.  Procedure note 37943; 15 minutes spent for Therapeutic exercises as stated in above notes.  This included exercises focusing on stretching, strengthening, with significant focus on eccentric aspects.   Proper technique shown and discussed handout in great detail with ATC.  All questions were discussed and answered.     Impression and Recommendations:     This case required medical decision making of moderate complexity.

## 2014-04-12 NOTE — Assessment & Plan Note (Signed)
Patient was given an injection today. We discussed icing regimen and home exercises. Patient did work with a Product/process development scientist today to learn home exercises in greater detail. We discussed topical anti-inflammatories and patient was given a trial size. Likely I think patient will do relatively well. We discussed proper lifting mechanics and avoiding any overhead activity until patient back again in 3 weeks for further evaluation. At that time I do think we will need to do a repeat ultrasound to make sure patient is healing appropriately.

## 2014-04-26 ENCOUNTER — Other Ambulatory Visit: Payer: Self-pay | Admitting: *Deleted

## 2014-04-26 MED ORDER — MELOXICAM 7.5 MG PO TABS: 7.5000 mg | ORAL_TABLET | Freq: Every day | ORAL | Status: DC

## 2014-04-26 NOTE — Telephone Encounter (Signed)
Refill done.  

## 2014-05-03 ENCOUNTER — Ambulatory Visit: Payer: Federal, State, Local not specified - PPO | Admitting: Family Medicine

## 2014-05-04 ENCOUNTER — Telehealth: Payer: Self-pay | Admitting: Family Medicine

## 2014-05-04 NOTE — Telephone Encounter (Signed)
yes

## 2014-05-04 NOTE — Telephone Encounter (Signed)
Patient was a no show yesterday. Ok to reschedule?

## 2014-05-05 NOTE — Telephone Encounter (Signed)
Called patient home ph # left vm

## 2014-07-28 ENCOUNTER — Other Ambulatory Visit: Payer: Self-pay | Admitting: Family Medicine

## 2014-07-28 NOTE — Telephone Encounter (Signed)
Refill done.  

## 2014-08-29 ENCOUNTER — Other Ambulatory Visit: Payer: Self-pay

## 2016-01-08 DIAGNOSIS — D860 Sarcoidosis of lung: Secondary | ICD-10-CM | POA: Diagnosis not present

## 2016-01-08 DIAGNOSIS — M2550 Pain in unspecified joint: Secondary | ICD-10-CM | POA: Diagnosis not present

## 2016-01-08 DIAGNOSIS — Z6825 Body mass index (BMI) 25.0-25.9, adult: Secondary | ICD-10-CM | POA: Diagnosis not present

## 2016-01-08 DIAGNOSIS — J019 Acute sinusitis, unspecified: Secondary | ICD-10-CM | POA: Diagnosis not present

## 2016-01-08 DIAGNOSIS — E7800 Pure hypercholesterolemia, unspecified: Secondary | ICD-10-CM | POA: Diagnosis not present

## 2016-01-08 DIAGNOSIS — I119 Hypertensive heart disease without heart failure: Secondary | ICD-10-CM | POA: Diagnosis not present

## 2016-01-08 DIAGNOSIS — Z23 Encounter for immunization: Secondary | ICD-10-CM | POA: Diagnosis not present

## 2016-01-08 DIAGNOSIS — E1165 Type 2 diabetes mellitus with hyperglycemia: Secondary | ICD-10-CM | POA: Diagnosis not present

## 2016-01-08 DIAGNOSIS — Z79899 Other long term (current) drug therapy: Secondary | ICD-10-CM | POA: Diagnosis not present

## 2016-01-08 DIAGNOSIS — N181 Chronic kidney disease, stage 1: Secondary | ICD-10-CM | POA: Diagnosis not present

## 2016-02-08 DIAGNOSIS — H1131 Conjunctival hemorrhage, right eye: Secondary | ICD-10-CM | POA: Diagnosis not present

## 2016-02-21 ENCOUNTER — Telehealth: Payer: Self-pay | Admitting: Emergency Medicine

## 2016-02-21 NOTE — Telephone Encounter (Signed)
Spoke to pt, offered her an appt 12.22.17 w/ Terri Piedra. Pt chose to wait for appt with Dr. Tamala Julian on 12.26.17 @ 1145am.

## 2016-02-21 NOTE — Telephone Encounter (Signed)
Pt called and states she is having severe lower back back and wants to know if you would be able to fit her in or have an recommendations on what to do to make it through until next week? Please advise thanks.

## 2016-02-24 NOTE — Progress Notes (Deleted)
[name] [address], [place] [address] Phone: [phone] Subjective:    I'm seeing this patient by the request  of:  KILPATRICK JR,GEORGE R, MD   CC: Back pain  [id number]  Kristie King is a 65 y.o. female coming in with complaint of back pain.     Past Medical History:  Diagnosis Date  . Arthritis    "knees; elbows" (09/17/2012)  . Dyslipidemia 09/17/2012  . HTN (hypertension) 09/17/2012  . Type II diabetes mellitus    Past Surgical History:  Procedure Laterality Date  . BUNIONECTOMY Right 02/18/13   [place] Bunion[facility] @ [facility]  . BUNIONECTOMY Left 04/01/2013   [place] [facility] @[facility]    Social History   Social History  . Marital status: Married    Spouse name: N/A  . Number of children: N/A  . Years of education: N/A   Social History Main Topics  . Smoking status: Former Smoker    Years: 31.00    Types: Cigarettes    Quit date: 01/14/2013  . Smokeless tobacco: Never Used  . Alcohol use Yes     Comment: 09/17/2012 "drink on special occasions only"  . Drug use: No  . Sexual activity: Yes   Other Topics Concern  . Not on file   Social History Narrative  . No narrative on file   No Known Allergies Family History  Problem Relation Age of Onset  . Hyperlipidemia Sister   . Diabetes Brother   . Hypertension Brother   . Diabetes Sister   . Hypertension Sister     Past medical history, social, surgical and family history all reviewed in electronic medical record.  No pertanent information unless stated regarding to the chief complaint.   Review of Systems:Review of systems updated and as accurate as of 02/24/16  No headache, visual changes, nausea, vomiting, diarrhea, constipation, dizziness, abdominal pain, skin rash, fevers, chills, night sweats, weight loss, swollen lymph nodes, body aches, joint swelling, muscle aches, chest pain, shortness of breath, mood changes.   Objective  There were no vitals taken for  this visit. Systems examined below as of 02/24/16   General: No apparent distress alert and oriented x3 mood and affect normal, dressed appropriately.  HEENT: Pupils equal, extraocular movements intact  Respiratory: Patient's speak in full sentences and does not appear short of breath  Cardiovascular: No lower extremity edema, non tender, no erythema  Skin: Warm dry intact with no signs of infection or rash on extremities or on axial skeleton.  Abdomen: Soft nontender  Neuro: Cranial nerves II through XII are intact, neurovascularly intact in all extremities with 2+ DTRs and 2+ pulses.  Lymph: No lymphadenopathy of posterior or anterior cervical chain or axillae bilaterally.  Gait normal with good balance and coordination.  MSK:  Non tender with full range of motion and good stability and symmetric strength and tone of shoulders, elbows, wrist, hip, knee and ankles bilaterally.  Back Exam:  Inspection: Unremarkable  Motion: Flexion 45 deg, Extension 45 deg, Side Bending to 45 deg bilaterally,  Rotation to 45 deg bilaterally  SLR laying: Negative  XSLR laying: Negative  Palpable tenderness: None. FABER: negative. Sensory change: Gross sensation intact to all lumbar and sacral dermatomes.  Reflexes: 2+ at both patellar tendons, 2+ at achilles tendons, Babinski's downgoing.  Strength at foot  Plantar-flexion: 5/5 Dorsi-flexion: 5/5 Eversion: 5/5 Inversion: 5/5  Leg strength  Quad: 5/5 Hamstring: 5/5 Hip flexor: 5/5 Hip abductors: 5/5  Gait unremarkable.  Impression and Recommendations:     This case required medical decision making of moderate complexity.      Note: This dictation was prepared with Dragon dictation along with smaller phrase technology. Any transcriptional errors that result from this process are unintentional.

## 2016-02-27 ENCOUNTER — Ambulatory Visit: Payer: Federal, State, Local not specified - PPO | Admitting: Family Medicine

## 2016-02-27 DIAGNOSIS — R062 Wheezing: Secondary | ICD-10-CM | POA: Diagnosis not present

## 2016-02-27 DIAGNOSIS — J208 Acute bronchitis due to other specified organisms: Secondary | ICD-10-CM | POA: Diagnosis not present

## 2016-02-27 DIAGNOSIS — R05 Cough: Secondary | ICD-10-CM | POA: Diagnosis not present

## 2016-04-16 DIAGNOSIS — Z79899 Other long term (current) drug therapy: Secondary | ICD-10-CM | POA: Diagnosis not present

## 2016-04-16 DIAGNOSIS — E7800 Pure hypercholesterolemia, unspecified: Secondary | ICD-10-CM | POA: Diagnosis not present

## 2016-04-16 DIAGNOSIS — E1169 Type 2 diabetes mellitus with other specified complication: Secondary | ICD-10-CM | POA: Diagnosis not present

## 2016-04-16 DIAGNOSIS — J019 Acute sinusitis, unspecified: Secondary | ICD-10-CM | POA: Diagnosis not present

## 2016-04-16 DIAGNOSIS — M2550 Pain in unspecified joint: Secondary | ICD-10-CM | POA: Diagnosis not present

## 2016-04-16 DIAGNOSIS — I119 Hypertensive heart disease without heart failure: Secondary | ICD-10-CM | POA: Diagnosis not present

## 2016-04-16 DIAGNOSIS — Z6825 Body mass index (BMI) 25.0-25.9, adult: Secondary | ICD-10-CM | POA: Diagnosis not present

## 2016-04-16 DIAGNOSIS — D860 Sarcoidosis of lung: Secondary | ICD-10-CM | POA: Diagnosis not present

## 2016-04-29 DIAGNOSIS — H524 Presbyopia: Secondary | ICD-10-CM | POA: Diagnosis not present

## 2016-04-29 DIAGNOSIS — Z0100 Encounter for examination of eyes and vision without abnormal findings: Secondary | ICD-10-CM | POA: Diagnosis not present

## 2016-07-09 DIAGNOSIS — M545 Low back pain: Secondary | ICD-10-CM | POA: Diagnosis not present

## 2016-07-09 DIAGNOSIS — M79602 Pain in left arm: Secondary | ICD-10-CM | POA: Diagnosis not present

## 2016-07-09 DIAGNOSIS — M79662 Pain in left lower leg: Secondary | ICD-10-CM | POA: Diagnosis not present

## 2016-07-09 DIAGNOSIS — M79661 Pain in right lower leg: Secondary | ICD-10-CM | POA: Diagnosis not present

## 2016-07-09 DIAGNOSIS — M25512 Pain in left shoulder: Secondary | ICD-10-CM | POA: Diagnosis not present

## 2016-07-12 ENCOUNTER — Encounter: Payer: Self-pay | Admitting: Sports Medicine

## 2016-07-12 ENCOUNTER — Ambulatory Visit (INDEPENDENT_AMBULATORY_CARE_PROVIDER_SITE_OTHER): Payer: Medicare HMO

## 2016-07-12 ENCOUNTER — Ambulatory Visit (INDEPENDENT_AMBULATORY_CARE_PROVIDER_SITE_OTHER): Payer: Medicare HMO | Admitting: Sports Medicine

## 2016-07-12 VITALS — BP 140/80 | HR 72 | Ht 63.0 in | Wt 155.0 lb

## 2016-07-12 DIAGNOSIS — M4722 Other spondylosis with radiculopathy, cervical region: Secondary | ICD-10-CM

## 2016-07-12 DIAGNOSIS — M545 Low back pain, unspecified: Secondary | ICD-10-CM | POA: Insufficient documentation

## 2016-07-12 DIAGNOSIS — S3992XA Unspecified injury of lower back, initial encounter: Secondary | ICD-10-CM | POA: Diagnosis not present

## 2016-07-12 DIAGNOSIS — M542 Cervicalgia: Secondary | ICD-10-CM | POA: Diagnosis not present

## 2016-07-12 DIAGNOSIS — S199XXA Unspecified injury of neck, initial encounter: Secondary | ICD-10-CM | POA: Diagnosis not present

## 2016-07-12 MED ORDER — METHYLPREDNISOLONE 4 MG PO TBPK: ORAL_TABLET | ORAL | 0 refills | Status: DC
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
## 2016-07-16 ENCOUNTER — Telehealth: Payer: Self-pay | Admitting: Sports Medicine

## 2016-07-16 NOTE — Telephone Encounter (Signed)
Forwarding to [place] to contact pt regarding exercises.

## 2016-07-16 NOTE — Telephone Encounter (Signed)
Patient called to advise that she still has pain from her left shoulder all the way down to hand/fingers. Patient is also having pain still in the back of her legs. Patient would like to know about the exercises that Dr. Paulla Fore assigned her to do. Please call and advise patient today.

## 2016-07-17 NOTE — Telephone Encounter (Signed)
*  Patient was given [facility] spine exercises*..Called patient. Left VM advising her to call back office to discuss her concerns

## 2016-07-17 NOTE — Telephone Encounter (Signed)
Spoke with patient. Advised her on spine conditioning program. Stated that she should continue with program as advised per Dr Paulla Fore. If patient does not feel like the exercise program is helping to the extent she would like within another week, she will call office to schedule earlier appt with Dr Paulla Fore.

## 2016-07-22 DIAGNOSIS — E7800 Pure hypercholesterolemia, unspecified: Secondary | ICD-10-CM | POA: Diagnosis not present

## 2016-07-22 DIAGNOSIS — N182 Chronic kidney disease, stage 2 (mild): Secondary | ICD-10-CM | POA: Diagnosis not present

## 2016-07-22 DIAGNOSIS — I119 Hypertensive heart disease without heart failure: Secondary | ICD-10-CM | POA: Diagnosis not present

## 2016-07-22 DIAGNOSIS — Z6826 Body mass index (BMI) 26.0-26.9, adult: Secondary | ICD-10-CM | POA: Diagnosis not present

## 2016-07-22 DIAGNOSIS — Z79899 Other long term (current) drug therapy: Secondary | ICD-10-CM | POA: Diagnosis not present

## 2016-07-22 DIAGNOSIS — M2550 Pain in unspecified joint: Secondary | ICD-10-CM | POA: Diagnosis not present

## 2016-07-22 DIAGNOSIS — E1165 Type 2 diabetes mellitus with hyperglycemia: Secondary | ICD-10-CM | POA: Diagnosis not present

## 2016-07-22 DIAGNOSIS — D860 Sarcoidosis of lung: Secondary | ICD-10-CM | POA: Diagnosis not present

## 2016-08-03 DIAGNOSIS — M4722 Other spondylosis with radiculopathy, cervical region: Secondary | ICD-10-CM | POA: Insufficient documentation

## 2016-08-03 HISTORY — DX: Other spondylosis with radiculopathy, cervical region: M47.22

## 2016-08-03 NOTE — Addendum Note (Signed)
Addended by: Teresa Coombs D on: 08/03/2016 07:25 PM   Modules accepted: Level of Service

## 2016-08-03 NOTE — Assessment & Plan Note (Signed)
[facility] spine conditioning program provided.  Medrol Dosepak for anti-inflammatory effect. Patient is not interested in gabapentinoids at this time.  If any lack of improvement can consider further diagnostic evaluation.  No red flags on exam.

## 2016-08-03 NOTE — Assessment & Plan Note (Signed)
Generalized low back pain without significant radicular component.  Likely facet hypertrophy based on findings.

## 2016-08-23 ENCOUNTER — Encounter: Payer: Self-pay | Admitting: Sports Medicine

## 2016-08-23 ENCOUNTER — Ambulatory Visit (INDEPENDENT_AMBULATORY_CARE_PROVIDER_SITE_OTHER): Payer: Medicare HMO | Admitting: Sports Medicine

## 2016-08-23 VITALS — BP 140/80 | HR 88 | Ht 63.0 in | Wt 156.8 lb

## 2016-08-23 DIAGNOSIS — M545 Low back pain: Secondary | ICD-10-CM

## 2016-08-23 DIAGNOSIS — M4722 Other spondylosis with radiculopathy, cervical region: Secondary | ICD-10-CM

## 2016-08-23 MED ORDER — IBUPROFEN 800 MG PO TABS: 800.0000 mg | ORAL_TABLET | Freq: Three times a day (TID) | ORAL | 2 refills | Status: DC | PRN

## 2016-08-23 MED ORDER — FAMOTIDINE 20 MG PO TABS: 20.0000 mg | ORAL_TABLET | Freq: Two times a day (BID) | ORAL | 5 refills | Status: DC
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
## 2016-09-10 ENCOUNTER — Ambulatory Visit (INDEPENDENT_AMBULATORY_CARE_PROVIDER_SITE_OTHER): Payer: Medicare HMO | Admitting: Family Medicine

## 2016-09-10 ENCOUNTER — Encounter: Payer: Self-pay | Admitting: Family Medicine

## 2016-09-10 DIAGNOSIS — M545 Low back pain: Secondary | ICD-10-CM

## 2016-09-10 DIAGNOSIS — M5412 Radiculopathy, cervical region: Secondary | ICD-10-CM | POA: Diagnosis not present

## 2016-09-10 HISTORY — DX: Radiculopathy, cervical region: M54.12

## 2016-09-10 MED ORDER — METHYLPREDNISOLONE ACETATE 80 MG/ML IJ SUSP
80.0000 mg | Freq: Once | INTRAMUSCULAR | Status: AC
  Administered 2016-09-10: 80 mg via INTRAMUSCULAR

## 2016-09-10 MED ORDER — PREDNISONE 50 MG PO TABS: 50.0000 mg | ORAL_TABLET | Freq: Every day | ORAL | 0 refills | Status: DC

## 2016-09-10 MED ORDER — GABAPENTIN 100 MG PO CAPS: 200.0000 mg | ORAL_CAPSULE | Freq: Every day | ORAL | 3 refills | Status: DC

## 2016-09-10 MED ORDER — TIZANIDINE HCL 4 MG PO CAPS: 4.0000 mg | ORAL_CAPSULE | Freq: Three times a day (TID) | ORAL | 1 refills | Status: DC | PRN

## 2016-09-10 MED ORDER — KETOROLAC TROMETHAMINE 60 MG/2ML IM SOLN
60.0000 mg | Freq: Once | INTRAMUSCULAR | Status: AC
  Administered 2016-09-10: 60 mg via INTRAMUSCULAR

## 2016-09-10 NOTE — Assessment & Plan Note (Signed)
Patient is having some cervical radiculopathy. Patient's previous imaging including x-rays of the neck were independently visualized by me showing some very mild degenerative disc disease. Possible herniated disc is within the differential. Started on gabapentin, prednisone 5 day burst Discussed Icing Regimen. Discuss If Any Worsening Symptoms Advance Imaging Could Be Warranted. Patient Is in Agreement with This. Follow-Up Again in 2 Weeks.
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
## 2016-09-10 NOTE — Assessment & Plan Note (Signed)
Stable overall. I do believe some lumbar whiplash. I do not see any significant bony abnormality on x-ray to make me concerned today. Patient will likely respond to the conservative therapy. We discussed more of a low impact exercises for the next week. Follow-up in 2 weeks and advance accordingly. Worsening symptoms or radicular symptoms advance imaging could be warranted.

## 2016-09-10 NOTE — Patient Instructions (Addendum)
Good to see you  Ice 20 minutes 2 times daily. Usually after activity and before bed. Prednsione starting tomorrow for 5 days Gabapentin 200mg  at night Zanaflex up to 3 times a day as needed See me again in 2 weeks (OK to double book)  We will consider manipulation

## 2016-09-10 NOTE — Addendum Note (Signed)
Addended by: Douglass Rivers T on: 09/10/2016 12:26 PM   Modules accepted: Orders

## 2016-09-20 ENCOUNTER — Ambulatory Visit: Payer: Medicare HMO | Admitting: Sports Medicine

## 2016-09-20 NOTE — Assessment & Plan Note (Addendum)
Somewhat improved with therapeutic exercises.  Continue with this.

## 2016-09-20 NOTE — Assessment & Plan Note (Signed)
Once again discussed the options for further evaluation versus continued conservative care and she wishes to proceed with continued therapeutic exercises.  She does not wish for any further medication changes.  We will follow-up with her in 4 weeks to ensure that this is improved and if persistent radicular symptoms at that time will plan for MRI of the cervical spine.

## 2016-09-24 ENCOUNTER — Ambulatory Visit: Payer: Self-pay | Admitting: Family Medicine

## 2016-10-01 ENCOUNTER — Ambulatory Visit (INDEPENDENT_AMBULATORY_CARE_PROVIDER_SITE_OTHER): Payer: Medicare HMO | Admitting: Family Medicine

## 2016-10-01 ENCOUNTER — Encounter: Payer: Self-pay | Admitting: Family Medicine

## 2016-10-01 ENCOUNTER — Other Ambulatory Visit (INDEPENDENT_AMBULATORY_CARE_PROVIDER_SITE_OTHER): Payer: Medicare HMO

## 2016-10-01 VITALS — BP 118/76 | HR 68 | Wt 151.0 lb

## 2016-10-01 DIAGNOSIS — M4722 Other spondylosis with radiculopathy, cervical region: Secondary | ICD-10-CM

## 2016-10-01 DIAGNOSIS — R252 Cramp and spasm: Secondary | ICD-10-CM | POA: Diagnosis not present

## 2016-10-01 DIAGNOSIS — M2550 Pain in unspecified joint: Secondary | ICD-10-CM

## 2016-10-01 DIAGNOSIS — M545 Low back pain: Secondary | ICD-10-CM

## 2016-10-01 LAB — IBC PANEL
Iron: 70 ug/dL (ref 42–145)
SATURATION RATIOS: 14.9 % — AB (ref 20.0–50.0)
TRANSFERRIN: 336 mg/dL (ref 212.0–360.0)

## 2016-10-01 LAB — CBC WITH DIFFERENTIAL/PLATELET
BASOS PCT: 0.7 % (ref 0.0–3.0)
Basophils Absolute: 0.1 10*3/uL (ref 0.0–0.1)
EOS PCT: 1 % (ref 0.0–5.0)
Eosinophils Absolute: 0.1 10*3/uL (ref 0.0–0.7)
HEMATOCRIT: 37.7 % (ref 36.0–46.0)
Hemoglobin: 12.2 g/dL (ref 12.0–15.0)
Lymphocytes Relative: 23.8 % (ref 12.0–46.0)
Lymphs Abs: 2.1 10*3/uL (ref 0.7–4.0)
MCHC: 32.3 g/dL (ref 30.0–36.0)
MCV: 89.8 fl (ref 78.0–100.0)
MONO ABS: 0.9 10*3/uL (ref 0.1–1.0)
Monocytes Relative: 10.1 % (ref 3.0–12.0)
NEUTROS ABS: 5.8 10*3/uL (ref 1.4–7.7)
Neutrophils Relative %: 64.4 % (ref 43.0–77.0)
PLATELETS: 275 10*3/uL (ref 150.0–400.0)
RBC: 4.2 Mil/uL (ref 3.87–5.11)
RDW: 15.1 % (ref 11.5–15.5)
WBC: 8.9 10*3/uL (ref 4.0–10.5)

## 2016-10-01 LAB — SEDIMENTATION RATE: Sed Rate: 11 mm/hr (ref 0–30)

## 2016-10-01 LAB — COMPREHENSIVE METABOLIC PANEL
ALBUMIN: 4.7 g/dL (ref 3.5–5.2)
ALT: 7 U/L (ref 0–35)
AST: 22 U/L (ref 0–37)
Alkaline Phosphatase: 57 U/L (ref 39–117)
BUN: 27 mg/dL — AB (ref 6–23)
CHLORIDE: 99 meq/L (ref 96–112)
CO2: 29 meq/L (ref 19–32)
CREATININE: 1.24 mg/dL — AB (ref 0.40–1.20)
Calcium: 10.1 mg/dL (ref 8.4–10.5)
GFR: 55.71 mL/min — ABNORMAL LOW (ref >60.00)
Glucose, Bld: 132 mg/dL — ABNORMAL HIGH (ref 70–99)
POTASSIUM: 3.7 meq/L (ref 3.5–5.1)
SODIUM: 138 meq/L (ref 135–145)
Total Bilirubin: 0.6 mg/dL (ref 0.2–1.2)
Total Protein: 7.8 g/dL (ref 6.0–8.3)

## 2016-10-01 LAB — VITAMIN D 25 HYDROXY (VIT D DEFICIENCY, FRACTURES): VITD: 18.23 ng/mL — AB (ref 30.00–100.00)

## 2016-10-01 LAB — MAGNESIUM: MAGNESIUM: 1.9 mg/dL (ref 1.5–2.5)

## 2016-10-01 LAB — CK: Total CK: 300 U/L — ABNORMAL HIGH (ref 7–177)

## 2016-10-01 NOTE — Assessment & Plan Note (Signed)
Patient is having some muscle cramping. Discussed with patient at great length. We discussed icing regimen and home exercises. We discussed which activities doing which ones to avoid. Patient can be very deficient, vitamin D deficient, or this could be more of a lumbar radiculopathy. If no improvement advance imaging would be warranted. Laboratory workup pending.

## 2016-10-01 NOTE — Assessment & Plan Note (Addendum)
Continues to have significant low back pain. Significant tightness. Patient declined any type of physical therapy. We'll try to do the home exercises. Attempts to try to increase activity as tolerated. Patient continues to work out. Patient did not notice any benefit with the muscle relaxer over the gabapentin. Discussed with patient about laboratory workup information cramping. No improvement until he can do feel that further imaging will be necessary.

## 2016-10-01 NOTE — Assessment & Plan Note (Signed)
Patient does have osteophytic changes of the neck that is mild-to-moderate. Patient is having some very intermittent radicular symptoms. We will continue to monitor. Patient's worsening symptoms such as weakness or numbness patient is to seek medical attention immediately. Follow-up with patient again in 2 weeks.
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
## 2016-10-01 NOTE — Patient Instructions (Signed)
Good to see you  Lets watch the cramping if worsen go to ER but should be fine DRINK A LOT OF WATER We will get labs downstairs Red meat 1-2 times a week Iron 65mg  with 500mg  of vitamin C  Keep doing everything else  See me again in 2 weeks

## 2016-10-02 LAB — CALCIUM, IONIZED: Calcium, Ion: 5.2 mg/dL (ref 4.8–5.6)

## 2016-10-16 ENCOUNTER — Encounter: Payer: Self-pay | Admitting: Family Medicine

## 2016-10-16 ENCOUNTER — Ambulatory Visit (INDEPENDENT_AMBULATORY_CARE_PROVIDER_SITE_OTHER): Payer: Medicare HMO | Admitting: Family Medicine

## 2016-10-16 DIAGNOSIS — D509 Iron deficiency anemia, unspecified: Secondary | ICD-10-CM | POA: Insufficient documentation

## 2016-10-16 DIAGNOSIS — E559 Vitamin D deficiency, unspecified: Secondary | ICD-10-CM | POA: Insufficient documentation

## 2016-10-16 DIAGNOSIS — M545 Low back pain: Secondary | ICD-10-CM

## 2016-10-16 DIAGNOSIS — D508 Other iron deficiency anemias: Secondary | ICD-10-CM

## 2016-10-16 HISTORY — DX: Iron deficiency anemia, unspecified: D50.9

## 2016-10-16 HISTORY — DX: Vitamin D deficiency, unspecified: E55.9

## 2016-10-16 MED ORDER — VITAMIN D (ERGOCALCIFEROL) 1.25 MG (50000 UNIT) PO CAPS: 50000.0000 [IU] | ORAL_CAPSULE | ORAL | 0 refills | Status: DC

## 2016-10-16 NOTE — Assessment & Plan Note (Signed)
Once weekly vitamin D prescribed ?
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
## 2016-10-16 NOTE — Assessment & Plan Note (Signed)
There is a concern for some radicular symptoms. Patient will continue with conservative therapy though. Patient will start with once weekly vitamin D for muscle strength and endurance and was found to be low. This could be contribute to some of the discomfort. We discussed icing regimen, we discussed which activities to do in which ones to avoid. Patient will start to increase activity as tolerated. Follow-up with me again in 4 weeks

## 2016-10-16 NOTE — Patient Instructions (Signed)
good to see you  I am ok with tonic water or pickle juice at night 1/4 cup of gatorade with your water during the day  Continue the iron and vitamin C Once weekly vitamin D sent into your pharmacy  See me again in 3-4 weeks to make sure you are better and we do not need the imaging. Marland Kitchen

## 2016-10-16 NOTE — Assessment & Plan Note (Signed)
Discussed supplementation, discussed how this can weaken potentially causing some of the symptomatology. Patient will try to make these changes and come back and see me again in 4 weeks

## 2016-10-23 ENCOUNTER — Telehealth: Payer: Self-pay | Admitting: Family Medicine

## 2016-10-23 NOTE — Telephone Encounter (Signed)
States she is having a lot of back pain.  Would like to know if there was something Dr.Smith could call in to her pharmacy at [facility] at [address].

## 2016-10-23 NOTE — Telephone Encounter (Signed)
Increase gabapentin to 300mg  Take her ibuprofen 800mg  2-3 times a day

## 2016-10-24 NOTE — Telephone Encounter (Signed)
lmovm for pt to return call.  

## 2016-10-24 NOTE — Telephone Encounter (Signed)
Discussed with pt

## 2016-11-13 ENCOUNTER — Encounter: Payer: Self-pay | Admitting: Family Medicine

## 2016-11-13 ENCOUNTER — Ambulatory Visit (INDEPENDENT_AMBULATORY_CARE_PROVIDER_SITE_OTHER): Payer: Medicare HMO | Admitting: Family Medicine

## 2016-11-13 VITALS — BP 120/84 | HR 44 | Ht 62.0 in | Wt 150.0 lb

## 2016-11-13 DIAGNOSIS — M5416 Radiculopathy, lumbar region: Secondary | ICD-10-CM

## 2016-11-13 DIAGNOSIS — M19012 Primary osteoarthritis, left shoulder: Secondary | ICD-10-CM | POA: Diagnosis not present

## 2016-11-13 DIAGNOSIS — M545 Low back pain: Secondary | ICD-10-CM

## 2016-11-13 DIAGNOSIS — M19019 Primary osteoarthritis, unspecified shoulder: Secondary | ICD-10-CM | POA: Insufficient documentation

## 2016-11-13 HISTORY — DX: Primary osteoarthritis, unspecified shoulder: M19.019

## 2016-11-13 MED ORDER — VENLAFAXINE HCL ER 37.5 MG PO CP24: 37.5000 mg | ORAL_CAPSULE | Freq: Every day | ORAL | 1 refills | Status: DC

## 2016-11-13 NOTE — Assessment & Plan Note (Signed)
Patient given injection today. Likely has some mild bursitis as well. Patient's differential also includes cervical radiculopathy. Continues to gabapentin. We discussed icing regimen, patient will see how she responds. Follow-up again with me in 3 weeks
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
## 2016-11-13 NOTE — Assessment & Plan Note (Signed)
Continues to have low back pain seems to be out of proportion. X-rays were independently visualized by me showing no significant bony abnormality. Discussed with patient at great length and I do think that some of this could be more of a reflex and dystrophy. Possible herniated disc is within the differential. Patient will start with formal physical therapy. Started on a low dose of Effexor. Patient will come back and see me again in 3 weeks to see how patient is responding. MRI would be needed

## 2016-11-13 NOTE — Patient Instructions (Addendum)
Good to see you  Injected the left AC joint today  Try to keep hands within peripheral vision.  For the back  Physical therapy will be calling you  Effexor 37.5 mg daily and watch for any side effects See me again in 3 weeks.

## 2016-11-19 ENCOUNTER — Other Ambulatory Visit: Payer: Self-pay | Admitting: *Deleted

## 2016-11-19 MED ORDER — GABAPENTIN 100 MG PO CAPS: 200.0000 mg | ORAL_CAPSULE | Freq: Every day | ORAL | 1 refills | Status: DC

## 2016-11-19 NOTE — Telephone Encounter (Signed)
Refill done.  

## 2016-12-02 DIAGNOSIS — Z1211 Encounter for screening for malignant neoplasm of colon: Secondary | ICD-10-CM | POA: Diagnosis not present

## 2016-12-02 DIAGNOSIS — K640 First degree hemorrhoids: Secondary | ICD-10-CM | POA: Diagnosis not present

## 2016-12-02 DIAGNOSIS — K5730 Diverticulosis of large intestine without perforation or abscess without bleeding: Secondary | ICD-10-CM | POA: Diagnosis not present

## 2016-12-02 DIAGNOSIS — K6389 Other specified diseases of intestine: Secondary | ICD-10-CM | POA: Diagnosis not present

## 2016-12-03 DIAGNOSIS — M79602 Pain in left arm: Secondary | ICD-10-CM | POA: Diagnosis not present

## 2016-12-03 DIAGNOSIS — M545 Low back pain: Secondary | ICD-10-CM | POA: Diagnosis not present

## 2016-12-03 DIAGNOSIS — M79605 Pain in left leg: Secondary | ICD-10-CM | POA: Diagnosis not present

## 2016-12-03 DIAGNOSIS — M79604 Pain in right leg: Secondary | ICD-10-CM | POA: Diagnosis not present

## 2016-12-04 ENCOUNTER — Encounter: Payer: Self-pay | Admitting: Family Medicine

## 2016-12-04 ENCOUNTER — Ambulatory Visit (INDEPENDENT_AMBULATORY_CARE_PROVIDER_SITE_OTHER): Payer: Medicare HMO | Admitting: Family Medicine

## 2016-12-04 VITALS — BP 130/80 | HR 68 | Ht 63.0 in | Wt 151.0 lb

## 2016-12-04 DIAGNOSIS — M545 Low back pain: Secondary | ICD-10-CM

## 2016-12-04 DIAGNOSIS — M5416 Radiculopathy, lumbar region: Secondary | ICD-10-CM

## 2016-12-04 MED ORDER — METHYLPREDNISOLONE ACETATE 80 MG/ML IJ SUSP
80.0000 mg | Freq: Once | INTRAMUSCULAR | Status: AC
  Administered 2016-12-04: 80 mg via INTRAMUSCULAR

## 2016-12-04 MED ORDER — DULOXETINE HCL 20 MG PO CPEP: 20.0000 mg | ORAL_CAPSULE | Freq: Every day | ORAL | 1 refills | Status: DC

## 2016-12-04 MED ORDER — KETOROLAC TROMETHAMINE 60 MG/2ML IM SOLN
60.0000 mg | Freq: Once | INTRAMUSCULAR | Status: AC
  Administered 2016-12-04: 60 mg via INTRAMUSCULAR

## 2016-12-04 NOTE — Patient Instructions (Signed)
Good to see yo  Ice 20 minutes 2 times daily. Usually after activity and before bed. Keep up with PT   Cymbalta 20 mg daily to help with the nerves MRI of the back ordered See me again in 4 weeks.  Travel safe
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
## 2016-12-04 NOTE — Assessment & Plan Note (Signed)
Patient's low back pain seems to be worsening. Going on greater than 3 months from the injury. Patient has done everything else correctly including all conservative therapy as well as formal physical therapy. We discussed icing regimen. Patient has had some mild generalized weakness. Laboratory workup did show a small iron deficiency as well as vitamin D. Unfortunately that does not contribute to the amount of pain that she is having. I do feel that advance imaging including an MRI is necessary at this time patient also be a candidate for potential epidurals. Unable to take gabapentin regularly. Has difficulty also with the muscle relaxer. Patient will follow-up after the MRI and we'll discuss further.

## 2017-01-01 ENCOUNTER — Ambulatory Visit: Payer: Self-pay | Admitting: Family Medicine

## 2017-01-09 ENCOUNTER — Ambulatory Visit
Admission: RE | Admit: 2017-01-09 | Discharge: 2017-01-09 | Disposition: A | Discharge status: Home / Self Care | Payer: Medicare HMO | Source: Ambulatory Visit | Attending: Family Medicine | Admitting: Family Medicine

## 2017-01-09 ENCOUNTER — Other Ambulatory Visit: Payer: Self-pay

## 2017-01-09 DIAGNOSIS — M48061 Spinal stenosis, lumbar region without neurogenic claudication: Secondary | ICD-10-CM | POA: Diagnosis not present

## 2017-01-09 DIAGNOSIS — M5416 Radiculopathy, lumbar region: Secondary | ICD-10-CM

## 2017-01-20 NOTE — Progress Notes (Signed)
[name] [place], [place] [address] Phone: [phone] Subjective:     CC: back pain follow-up  [id number]  Kristie King is a 66 y.o. female coming in with complaint of back pain.  Patient continued to have lumbar radiculopathy and failed all conservative therapy including physical therapy.  She continued to have worsening symptoms and was sent for MRI of the lumbar spine.  MRI was done on January 09, 2017.  This was independently visualized by me showing patient having L3 through L5 spinal stenosis.  Did have some disc protrusion and also L3 and L4 nerve root impingement bilaterally left greater than right. Patient was to consider different options as well as start Cymbalta. She is taking Effexor as well as needed.  She is still having constant lower back pain and she feels it almost every morning. She continues to take gabapentin nightly. Patient reports that she has been having cramps in her calf and quad on the left side.      Past Medical History:  Diagnosis Date  . Arthritis    "knees; elbows" (09/17/2012)  . Dyslipidemia 09/17/2012  . HTN (hypertension) 09/17/2012  . Type II diabetes mellitus ([place])    Past Surgical History:  Procedure Laterality Date  . BUNIONECTOMY Right 02/18/13   [place] Bunionectomy @ [facility]  . BUNIONECTOMY Left 04/01/2013   [facility] [place] @[facility]    Social History   Socioeconomic History  . Marital status: Married    Spouse name: Not on file  . Number of children: Not on file  . Years of education: Not on file  . Highest education level: Not on file  Social Needs  . Financial resource strain: Not on file  . Food insecurity - worry: Not on file  . Food insecurity - inability: Not on file  . Transportation needs - medical: Not on file  . Transportation needs - non-medical: Not on file  Occupational History  . Not on file  Tobacco Use  . Smoking status: Former Smoker    Years: 31.00    Types:  Cigarettes    Last attempt to quit: 01/14/2013    Years since quitting: 4.0  . Smokeless tobacco: Never Used  Substance and Sexual Activity  . Alcohol use: Yes    Comment: 09/17/2012 "drink on special occasions only"  . Drug use: No  . Sexual activity: Yes  Other Topics Concern  . Not on file  Social History Narrative  . Not on file   No Known Allergies Family History  Problem Relation Age of Onset  . Hyperlipidemia Sister   . Diabetes Brother   . Hypertension Brother   . Diabetes Sister   . Hypertension Sister      Past medical history, social, surgical and family history all reviewed in electronic medical record.  No pertanent information unless stated regarding to the chief complaint.   Review of Systems:Review of systems updated and as accurate as of 01/20/17  No headache, visual changes, nausea, vomiting, diarrhea, constipation, dizziness, abdominal pain, skin rash, fevers, chills, night sweats, weight loss, swollen lymph nodes, body aches, joint swelling,chest pain, shortness of breath, mood changes.  Positive muscle aches  Objective  There were no vitals taken for this visit. Systems examined below as of 01/20/17   General: No apparent distress alert and oriented x3 mood and affect normal, dressed appropriately.  HEENT: Pupils equal, extraocular movements intact  Respiratory: Patient's speak in full sentences and does not appear short  of breath  Cardiovascular: No lower extremity edema, non tender, no erythema  Skin: Warm dry intact with no signs of infection or rash on extremities or on axial skeleton.  Abdomen: Soft nontender  Neuro: Cranial nerves II through XII are intact, neurovascularly intact in all extremities with 2+ DTRs and 2+ pulses.  Lymph: No lymphadenopathy of posterior or anterior cervical chain or axillae bilaterally.  Gait normal with good balance and coordination.  MSK:  Non tender with full range of motion and good stability and symmetric strength  and tone of shoulders, elbows, wrist, hip, knee and ankles bilaterally.  Back Exam:  Inspection: Mild loss of lordosis Motion: Flexion 35 deg, Extension 25 deg, Side Bending to 35 deg bilaterally,  Rotation to 45 deg bilaterally  SLR laying: Patient does have a positive straight leg test more on the right XSLR laying: Negative  Palpable tenderness: Tender to palpation the paraspinal musculature FABER: Increasing tightness in the right side. Sensory change: Gross sensation intact to all lumbar and sacral dermatomes.  Reflexes: 2+ at both patellar tendons, 2+ at achilles tendons, Babinski's downgoing.  Strength at foot  Plantar-flexion: 5/5 Dorsi-flexion: 5/5 Eversion: 5/5 Inversion: 5/5  Leg strength  Quad: 5/5 Hamstring: 5/5 Hip flexor: 5/5 Hip abductors: 4/5 but symmetric Gait unremarkable.    Impression and Recommendations:     This case required medical decision making of moderate complexity.      Note: This dictation was prepared with Dragon dictation along with smaller phrase technology. Any transcriptional errors that result from this process are unintentional.

## 2017-01-21 ENCOUNTER — Encounter: Payer: Self-pay | Admitting: Family Medicine

## 2017-01-21 ENCOUNTER — Ambulatory Visit: Payer: Medicare HMO | Admitting: Family Medicine

## 2017-01-21 VITALS — BP 120/82 | HR 71 | Wt 151.0 lb

## 2017-01-21 DIAGNOSIS — M48061 Spinal stenosis, lumbar region without neurogenic claudication: Secondary | ICD-10-CM

## 2017-01-21 DIAGNOSIS — M5416 Radiculopathy, lumbar region: Secondary | ICD-10-CM

## 2017-01-21 NOTE — Patient Instructions (Signed)
Good to see you  Kristie King is your friend.  I know that was a lot of info.  We will try an epidural and see how it goes.  Continue all the medicines  See me again 2-3 weeks AFTER the epidural and we will see how you are doing

## 2017-01-21 NOTE — Assessment & Plan Note (Signed)
Patient does have more of a spinal stenosis noted.  Noted on MRI that was independently visualized by me.  We discussed multiple different options including epidural, nerve root injections, increasing patient Cymbalta, or potentially surgical intervention.  Patient is elected to try an epidural for diagnostic and therapeutic purposes.  Patient will continue all other medications.  Follow-up with me again in 2-3 weeks for further evaluation and treatment.

## 2017-02-07 DIAGNOSIS — Z23 Encounter for immunization: Secondary | ICD-10-CM | POA: Diagnosis not present

## 2017-02-10 ENCOUNTER — Other Ambulatory Visit: Payer: Self-pay

## 2017-02-20 ENCOUNTER — Ambulatory Visit
Admission: RE | Admit: 2017-02-20 | Discharge: 2017-02-20 | Disposition: A | Discharge status: Home / Self Care | Payer: Medicare HMO | Source: Ambulatory Visit | Attending: Family Medicine | Admitting: Family Medicine

## 2017-02-20 DIAGNOSIS — M5416 Radiculopathy, lumbar region: Secondary | ICD-10-CM

## 2017-02-20 DIAGNOSIS — M48061 Spinal stenosis, lumbar region without neurogenic claudication: Secondary | ICD-10-CM | POA: Diagnosis not present

## 2017-02-20 MED ORDER — METHYLPREDNISOLONE ACETATE 40 MG/ML INJ SUSP (RADIOLOG
120.0000 mg | Freq: Once | INTRAMUSCULAR | Status: AC
  Administered 2017-02-20: 120 mg via EPIDURAL

## 2017-02-20 MED ORDER — IOPAMIDOL (ISOVUE-M 200) INJECTION 41%
1.0000 mL | Freq: Once | INTRAMUSCULAR | Status: AC
  Administered 2017-02-20: 1 mL via EPIDURAL

## 2017-02-20 NOTE — Discharge Instructions (Signed)

## 2017-03-04 DIAGNOSIS — Z9889 Other specified postprocedural states: Secondary | ICD-10-CM

## 2017-03-04 HISTORY — DX: Other specified postprocedural states: Z98.890

## 2017-04-06 NOTE — Progress Notes (Signed)
[name] [place] [place], [place] [address] Phone: [phone] Subjective:    I'm seeing this patient by the request  of:    CC: Back pain follow-up  [id number]  Kristie King is a 67 y.o. female coming in for follow up for back pain. Patient had epidural and radiating pain is gone but she now has pain in her back. She does note that occasionally that she has some pain in her legs. She wants to know if she should discontinue doing Zumba and lifting weights. She has stopped her gabapentin as she didn't want to be on it forever. She also wants to know if she should continue iron and vitamin C. patient did have the epidural done in December.  Has had good results.  No radicular symptoms.  Feeling continue to work out on a regular basis.  Not taking any other medications at this time.      Past Medical History:  Diagnosis Date  . Arthritis    "knees; elbows" (09/17/2012)  . Dyslipidemia 09/17/2012  . HTN (hypertension) 09/17/2012  . Type II diabetes mellitus ([place])    Past Surgical History:  Procedure Laterality Date  . BUNIONECTOMY Right 02/18/13   [place] Bunionectomy @ [facility]  . BUNIONECTOMY Left 04/01/2013   [facility] [place] @[facility]    Social History   Socioeconomic History  . Marital status: Married    Spouse name: None  . Number of children: None  . Years of education: None  . Highest education level: None  Social Needs  . Financial resource strain: None  . Food insecurity - worry: None  . Food insecurity - inability: None  . Transportation needs - medical: None  . Transportation needs - non-medical: None  Occupational History  . None  Tobacco Use  . Smoking status: Former Smoker    Years: 31.00    Types: Cigarettes    Last attempt to quit: 01/14/2013    Years since quitting: 4.2  . Smokeless tobacco: Never Used  Substance and Sexual Activity  . Alcohol use: Yes    Comment: 09/17/2012 "drink on special occasions only"  . Drug use:  No  . Sexual activity: Yes  Other Topics Concern  . None  Social History Narrative  . None   No Known Allergies Family History  Problem Relation Age of Onset  . Hyperlipidemia Sister   . Diabetes Brother   . Hypertension Brother   . Diabetes Sister   . Hypertension Sister      Past medical history, social, surgical and family history all reviewed in electronic medical record.  No pertanent information unless stated regarding to the chief complaint.   Review of Systems:Review of systems updated and as accurate as of 04/07/17  No headache, visual changes, nausea, vomiting, diarrhea, constipation, dizziness, abdominal pain, skin rash, fevers, chills, night sweats, weight loss, swollen lymph nodes, body aches, joint swelling, chest pain, shortness of breath, mood changes.   Objective  Blood pressure 118/80, pulse 66, height 5\' 3"  (1.6 m), weight 151 lb (68.5 kg), SpO2 99 %. Systems examined below as of 04/07/17   General: No apparent distress alert and oriented x3 mood and affect normal, dressed appropriately.  HEENT: Pupils equal, extraocular movements intact  Respiratory: Patient's speak in full sentences and does not appear short of breath  Cardiovascular: No lower extremity edema, non tender, no erythema  Skin: Warm dry intact with no signs of infection or rash on extremities or on axial skeleton.  Abdomen: Soft nontender  Neuro: Cranial nerves II through XII are intact, neurovascularly intact in all extremities with 2+ DTRs and 2+ pulses.  Lymph: No lymphadenopathy of posterior or anterior cervical chain or axillae bilaterally.  Gait normal with good balance and coordination.  MSK:  Non tender with full range of motion and good stability and symmetric strength and tone of shoulders, elbows, wrist, hip, knee and ankles bilaterally.  Back Exam:  Inspection: Mild loss of lordosis Motion: Flexion 35 deg, Extension 25 deg, Side Bending to 35 deg bilaterally,  Rotation to 35 deg  bilaterally  SLR laying: Negative  XSLR laying: Negative  Palpable tenderness: Tender to palpation in the paraspinal musculature lumbar spine right greater than left mostly in the lumbosacral area. FABER: negative. Sensory change: Gross sensation intact to all lumbar and sacral dermatomes.  Reflexes: 2+ at both patellar tendons, 2+ at achilles tendons, Babinski's downgoing.  Strength at foot  Plantar-flexion: 5/5 Dorsi-flexion: 5/5 Eversion: 5/5 Inversion: 5/5  Leg strength  Quad: 5/5 Hamstring: 5/5 Hip flexor: 5/5 Hip abductors: 5/5  Gait unremarkable.    Impression and Recommendations:     This case required medical decision making of moderate complexity.      Note: This dictation was prepared with Dragon dictation along with smaller phrase technology. Any transcriptional errors that result from this process are unintentional.

## 2017-04-07 ENCOUNTER — Ambulatory Visit: Payer: Medicare HMO | Admitting: Family Medicine

## 2017-04-07 ENCOUNTER — Encounter: Payer: Self-pay | Admitting: Family Medicine

## 2017-04-07 DIAGNOSIS — M48061 Spinal stenosis, lumbar region without neurogenic claudication: Secondary | ICD-10-CM

## 2017-04-07 NOTE — Patient Instructions (Addendum)
Good to see you  Alvera Singh is your friend.  Call me when you need me [phone] Hands within peripheral vision

## 2017-04-07 NOTE — Assessment & Plan Note (Signed)
Doing well at this time.  Responded to one epidural in December.  Going on 3 months.  Feels like she is making progress.  We discussed icing regimen and home exercises.  Discussed which activities to do which wants to avoid.  Patient will follow up with me again as needed

## 2017-04-15 DIAGNOSIS — E119 Type 2 diabetes mellitus without complications: Secondary | ICD-10-CM | POA: Diagnosis not present

## 2017-04-15 DIAGNOSIS — H35362 Drusen (degenerative) of macula, left eye: Secondary | ICD-10-CM | POA: Diagnosis not present

## 2017-04-15 DIAGNOSIS — H2513 Age-related nuclear cataract, bilateral: Secondary | ICD-10-CM | POA: Diagnosis not present

## 2017-04-15 DIAGNOSIS — H35033 Hypertensive retinopathy, bilateral: Secondary | ICD-10-CM | POA: Diagnosis not present

## 2017-04-15 DIAGNOSIS — H25013 Cortical age-related cataract, bilateral: Secondary | ICD-10-CM | POA: Diagnosis not present

## 2017-04-28 DIAGNOSIS — D860 Sarcoidosis of lung: Secondary | ICD-10-CM | POA: Diagnosis not present

## 2017-04-28 DIAGNOSIS — J00 Acute nasopharyngitis [common cold]: Secondary | ICD-10-CM | POA: Diagnosis not present

## 2017-04-28 DIAGNOSIS — E7800 Pure hypercholesterolemia, unspecified: Secondary | ICD-10-CM | POA: Diagnosis not present

## 2017-04-28 DIAGNOSIS — Z79899 Other long term (current) drug therapy: Secondary | ICD-10-CM | POA: Diagnosis not present

## 2017-04-28 DIAGNOSIS — J0130 Acute sphenoidal sinusitis, unspecified: Secondary | ICD-10-CM | POA: Diagnosis not present

## 2017-04-28 DIAGNOSIS — E1165 Type 2 diabetes mellitus with hyperglycemia: Secondary | ICD-10-CM | POA: Diagnosis not present

## 2017-04-28 DIAGNOSIS — Z6826 Body mass index (BMI) 26.0-26.9, adult: Secondary | ICD-10-CM | POA: Diagnosis not present

## 2017-04-28 DIAGNOSIS — I119 Hypertensive heart disease without heart failure: Secondary | ICD-10-CM | POA: Diagnosis not present

## 2017-04-28 DIAGNOSIS — N182 Chronic kidney disease, stage 2 (mild): Secondary | ICD-10-CM | POA: Diagnosis not present

## 2017-05-05 DIAGNOSIS — H2512 Age-related nuclear cataract, left eye: Secondary | ICD-10-CM | POA: Diagnosis not present

## 2017-05-05 DIAGNOSIS — H35033 Hypertensive retinopathy, bilateral: Secondary | ICD-10-CM | POA: Diagnosis not present

## 2017-05-05 DIAGNOSIS — E119 Type 2 diabetes mellitus without complications: Secondary | ICD-10-CM | POA: Diagnosis not present

## 2017-05-05 DIAGNOSIS — H25013 Cortical age-related cataract, bilateral: Secondary | ICD-10-CM | POA: Diagnosis not present

## 2017-05-05 DIAGNOSIS — H2513 Age-related nuclear cataract, bilateral: Secondary | ICD-10-CM | POA: Diagnosis not present

## 2017-05-20 DIAGNOSIS — H2512 Age-related nuclear cataract, left eye: Secondary | ICD-10-CM | POA: Diagnosis not present

## 2017-05-20 DIAGNOSIS — H25812 Combined forms of age-related cataract, left eye: Secondary | ICD-10-CM | POA: Diagnosis not present

## 2017-05-22 ENCOUNTER — Other Ambulatory Visit: Payer: Self-pay | Admitting: *Deleted

## 2017-05-22 DIAGNOSIS — M48061 Spinal stenosis, lumbar region without neurogenic claudication: Secondary | ICD-10-CM

## 2017-05-22 MED ORDER — PREDNISONE 50 MG PO TABS: ORAL_TABLET | ORAL | 0 refills | Status: DC

## 2017-05-27 ENCOUNTER — Encounter: Payer: Self-pay | Admitting: Family Medicine

## 2017-05-27 ENCOUNTER — Ambulatory Visit: Payer: Medicare HMO | Admitting: Family Medicine

## 2017-05-27 DIAGNOSIS — M48061 Spinal stenosis, lumbar region without neurogenic claudication: Secondary | ICD-10-CM

## 2017-05-27 NOTE — Progress Notes (Signed)
[name] [place], [place] [address] Phone: [phone] Subjective:    I'm seeing this patient by the request  of:    CC: Back pain follow-up  [id number]  Kristie King is a 67 y.o. female coming in with complaint of back pain. She was having some pain last Thursday that was bad. Patient took 5mg  prednisone which helped.  Patient has been seen previously.  Found to have spinal stenosis that was confirmed on an MRI that was independently visualized by me.  Was given an epidural and December of last year.  Was doing significantly better until the last several weeks.  Worsening discomfort again, with radiation of pain, starting to keep her from activities.  Concern again at this time.       Past Medical History:  Diagnosis Date  . Arthritis    "knees; elbows" (09/17/2012)  . Dyslipidemia 09/17/2012  . HTN (hypertension) 09/17/2012  . Type II diabetes mellitus ([place])    Past Surgical History:  Procedure Laterality Date  . BUNIONECTOMY Right 02/18/13   [facility] Bunionectomy @ [facility]  . BUNIONECTOMY Left 04/01/2013   [facility]    Social History   Socioeconomic History  . Marital status: Married    Spouse name: Not on file  . Number of children: Not on file  . Years of education: Not on file  . Highest education level: Not on file  Occupational History  . Not on file  Social Needs  . Financial resource strain: Not on file  . Food insecurity:    Worry: Not on file    Inability: Not on file  . Transportation needs:    Medical: Not on file    Non-medical: Not on file  Tobacco Use  . Smoking status: Former Smoker    Years: 31.00    Types: Cigarettes    Last attempt to quit: 01/14/2013    Years since quitting: 4.3  . Smokeless tobacco: Never Used  Substance and Sexual Activity  . Alcohol use: Yes    Comment: 09/17/2012 "drink on special occasions only"  . Drug use: No  . Sexual activity: Yes  Lifestyle  . Physical  activity:    Days per week: Not on file    Minutes per session: Not on file  . Stress: Not on file  Relationships  . Social connections:    Talks on phone: Not on file    Gets together: Not on file    Attends religious service: Not on file    Active member of club or organization: Not on file    Attends meetings of clubs or organizations: Not on file    Relationship status: Not on file  Other Topics Concern  . Not on file  Social History Narrative  . Not on file   No Known Allergies Family History  Problem Relation Age of Onset  . Hyperlipidemia Sister   . Diabetes Brother   . Hypertension Brother   . Diabetes Sister   . Hypertension Sister      Past medical history, social, surgical and family history all reviewed in electronic medical record.  No pertanent information unless stated regarding to the chief complaint.   Review of Systems:Review of systems updated and as accurate as of 05/27/17  No headache, visual changes, nausea, vomiting, diarrhea, constipation, dizziness, abdominal pain, skin rash, fevers, chills, night sweats, weight loss, swollen lymph nodes, body aches, joint swelling, chest pain, shortness of breath, mood changes.  Positive muscle aches  Objective  Blood pressure 128/78, pulse 82, height 5\' 3"  (1.6 m), weight 154 lb (69.9 kg), SpO2 98 %. Systems examined below as of 05/27/17   General: No apparent distress alert and oriented x3 mood and affect normal, dressed appropriately.  HEENT: Pupils equal, extraocular movements intact  Respiratory: Patient's speak in full sentences and does not appear short of breath  Cardiovascular: No lower extremity edema, non tender, no erythema  Skin: Warm dry intact with no signs of infection or rash on extremities or on axial skeleton.  Abdomen: Soft nontender  Neuro: Cranial nerves II through XII are intact, neurovascularly intact in all extremities with 2+ DTRs and 2+ pulses.  Lymph: No lymphadenopathy of posterior or  anterior cervical chain or axillae bilaterally.  Gait normal with good balance and coordination.  MSK:  Non tender with full range of motion and good stability and symmetric strength and tone of shoulders, elbows, wrist, hip, knee and ankles bilaterally.  Back Exam:  Inspection: Loss of lordosis Motion: Flexion 45 deg, Extension 20 deg, Side Bending to 35 deg bilaterally,  Rotation to 35 deg bilaterally  SLR laying: Mildly positive bilaterally 30 degrees of flexion XSLR laying: Negative  Palpable tenderness: Tender to palpation the paraspinal musculature of the lumbar spine. FABER: Tightness bilaterally. Sensory change: Gross sensation intact to all lumbar and sacral dermatomes.  Reflexes: 2+ at both patellar tendons, 2+ at achilles tendons, Babinski's downgoing.  Strength at foot  Plantar-flexion: 5/5 Dorsi-flexion: 5/5 Eversion: 5/5 Inversion: 5/5  Leg strength  Quad: 5/5 Hamstring: 5/5 Hip flexor: 5/5 Hip abductors: 5/5  Gait unremarkable.    Impression and Recommendations:     This case required medical decision making of moderate complexity.      Note: This dictation was prepared with Dragon dictation along with smaller phrase technology. Any transcriptional errors that result from this process are unintentional.

## 2017-05-27 NOTE — Assessment & Plan Note (Signed)
Worsening pain.  Seems to be an exacerbation of the spinal stenosis.  Radiation in the L4-L5 distribution again.  Epidural ordered again.  Think that this will be beneficial.  We discussed icing regimen and home exercises.  Discussed after the epidural follow-up again in 2-3 weeks.

## 2017-05-28 NOTE — Progress Notes (Signed)
[name] [place], [place] [address] Phone: [phone] Subjective:      CC: Back pain follow-up Back pain follow up   [id number]  Kristie King is a 67 y.o. female coming in with complaint of back pain. She had a flare up since Monday and would like to get an injection as she is going to be traveling by car and plane this weekend to [place]. Flare was not after a workout but occurred a night on Monday.   Back pain patient does have history of L4-L5 spinal listhesis causing spinal stenosis.  Awaiting epidural.     Past Medical History:  Diagnosis Date  . Arthritis    "knees; elbows" (09/17/2012)  . Dyslipidemia 09/17/2012  . HTN (hypertension) 09/17/2012  . Type II diabetes mellitus ([place])    Past Surgical History:  Procedure Laterality Date  . BUNIONECTOMY Right 02/18/13   [facility] Bunionectomy @ [facility]  . BUNIONECTOMY Left 04/01/2013   [place]  @[facility]    Social History   Socioeconomic History  . Marital status: Married    Spouse name: Not on file  . Number of children: Not on file  . Years of education: Not on file  . Highest education level: Not on file  Occupational History  . Not on file  Social Needs  . Financial resource strain: Not on file  . Food insecurity:    Worry: Not on file    Inability: Not on file  . Transportation needs:    Medical: Not on file    Non-medical: Not on file  Tobacco Use  . Smoking status: Former Smoker    Years: 31.00    Types: Cigarettes    Last attempt to quit: 01/14/2013    Years since quitting: 4.3  . Smokeless tobacco: Never Used  Substance and Sexual Activity  . Alcohol use: Yes    Comment: 09/17/2012 "drink on special occasions only"  . Drug use: No  . Sexual activity: Yes  Lifestyle  . Physical activity:    Days per week: Not on file    Minutes per session: Not on file  . Stress: Not on file  Relationships  . Social connections:    Talks on phone: Not on file    Gets  together: Not on file    Attends religious service: Not on file    Active member of club or organization: Not on file    Attends meetings of clubs or organizations: Not on file    Relationship status: Not on file  Other Topics Concern  . Not on file  Social History Narrative  . Not on file   No Known Allergies Family History  Problem Relation Age of Onset  . Hyperlipidemia Sister   . Diabetes Brother   . Hypertension Brother   . Diabetes Sister   . Hypertension Sister      Past medical history, social, surgical and family history all reviewed in electronic medical record.  No pertanent information unless stated regarding to the chief complaint.   Review of Systems:Review of systems updated and as accurate as of 05/29/17  No headache, visual changes, nausea, vomiting, diarrhea, constipation, dizziness, abdominal pain, skin rash, fevers, chills, night sweats, weight loss, swollen lymph nodes, body aches, joint swelling, muscle aches, chest pain, shortness of breath, mood changes.   Objective  Blood pressure 132/72, pulse 86, height 5\' 3"  (1.6 m), weight 154 lb (69.9 kg), SpO2 98 %. Systems examined below as  of 05/29/17   General: No apparent distress alert and oriented x3 mood and affect normal, dressed appropriately.  HEENT: Pupils equal, extraocular movements intact  Respiratory: Patient's speak in full sentences and does not appear short of breath  Cardiovascular: No lower extremity edema, non tender, no erythema  Skin: Warm dry intact with no signs of infection or rash on extremities or on axial skeleton.  Abdomen: Soft nontender  Neuro: Cranial nerves II through XII are intact, neurovascularly intact in all extremities with 2+ DTRs and 2+ pulses.  Lymph: No lymphadenopathy of posterior or anterior cervical chain or axillae bilaterally.  Gait normal with good balance and coordination.  MSK:  Non tender with full range of motion and good stability and symmetric strength and  tone of shoulders, elbows, wrist, hip, knee and ankles bilaterally.  Back exam shows the patient is having increasing tightness.  Decreasing range of motion by 5 degrees in all planes from previous visit.  Positive straight leg test mostly on the right side at 20 degrees as well as positive on the left side at 30 degrees.  Severe tenderness to palpation of the paraspinal musculature lumbar spine right greater than left.  Tightness with Corky Sox on the right.  Strength shows 4+ out of 5 with plantarflexion compared to contralateral side   Impression and Recommendations:     This case required medical decision making of moderate complexity.      Note: This dictation was prepared with Dragon dictation along with smaller phrase technology. Any transcriptional errors that result from this process are unintentional.

## 2017-05-29 ENCOUNTER — Encounter: Payer: Self-pay | Admitting: Family Medicine

## 2017-05-29 ENCOUNTER — Ambulatory Visit: Payer: Medicare HMO | Admitting: Family Medicine

## 2017-05-29 DIAGNOSIS — M48061 Spinal stenosis, lumbar region without neurogenic claudication: Secondary | ICD-10-CM | POA: Diagnosis not present

## 2017-05-29 MED ORDER — METHYLPREDNISOLONE ACETATE 80 MG/ML IJ SUSP
80.0000 mg | Freq: Once | INTRAMUSCULAR | Status: AC
  Administered 2017-05-29: 80 mg via INTRAMUSCULAR

## 2017-05-29 MED ORDER — KETOROLAC TROMETHAMINE 60 MG/2ML IM SOLN
60.0000 mg | Freq: Once | INTRAMUSCULAR | Status: AC
  Administered 2017-05-29: 60 mg via INTRAMUSCULAR

## 2017-05-29 MED ORDER — FLUTICASONE PROPIONATE 50 MCG/ACT NA SUSP: 2.0000 | Freq: Every day | NASAL | 6 refills | Status: DC

## 2017-05-29 NOTE — Patient Instructions (Addendum)
Good to see you  I am sorry for seeing you again so soon  Flonase each nostril daily for at least 2 weeks See me again 2 weeks AFTER the back injection

## 2017-05-29 NOTE — Assessment & Plan Note (Signed)
Worsening symptoms this time.  Seem to be doing well and is scheduled for an epidural.  Going out of town and given 2 injections today.  Has prednisone for breakthrough pain.  Encourage patient to potentially attempt the gabapentin.  Follow-up with me again in 3 weeks

## 2017-05-30 ENCOUNTER — Ambulatory Visit: Payer: Self-pay | Admitting: Family Medicine

## 2017-06-09 ENCOUNTER — Ambulatory Visit
Admission: RE | Admit: 2017-06-09 | Discharge: 2017-06-09 | Disposition: A | Discharge status: Home / Self Care | Payer: Medicare HMO | Source: Ambulatory Visit | Attending: Family Medicine | Admitting: Family Medicine

## 2017-06-09 DIAGNOSIS — M48061 Spinal stenosis, lumbar region without neurogenic claudication: Secondary | ICD-10-CM

## 2017-06-09 DIAGNOSIS — M545 Low back pain: Secondary | ICD-10-CM | POA: Diagnosis not present

## 2017-06-09 MED ORDER — IOPAMIDOL (ISOVUE-M 200) INJECTION 41%
1.0000 mL | Freq: Once | INTRAMUSCULAR | Status: AC
  Administered 2017-06-09: 1 mL via EPIDURAL

## 2017-06-09 MED ORDER — METHYLPREDNISOLONE ACETATE 40 MG/ML INJ SUSP (RADIOLOG
120.0000 mg | Freq: Once | INTRAMUSCULAR | Status: AC
  Administered 2017-06-09: 120 mg via EPIDURAL

## 2017-06-09 NOTE — Discharge Instructions (Signed)

## 2017-06-11 DIAGNOSIS — H2511 Age-related nuclear cataract, right eye: Secondary | ICD-10-CM | POA: Diagnosis not present

## 2017-06-11 DIAGNOSIS — H25011 Cortical age-related cataract, right eye: Secondary | ICD-10-CM | POA: Diagnosis not present

## 2017-06-17 DIAGNOSIS — H25811 Combined forms of age-related cataract, right eye: Secondary | ICD-10-CM | POA: Diagnosis not present

## 2017-06-17 DIAGNOSIS — H2511 Age-related nuclear cataract, right eye: Secondary | ICD-10-CM | POA: Diagnosis not present

## 2017-06-24 ENCOUNTER — Telehealth: Payer: Self-pay

## 2017-06-24 NOTE — Telephone Encounter (Signed)
Left message for patient to call back to schedule for a follow up post epidural.

## 2017-06-25 NOTE — Telephone Encounter (Signed)
Patient scheduled for next week.

## 2017-06-30 NOTE — Progress Notes (Signed)
[name] [place], [place] [address] Phone: [phone] Subjective:     CC: Neck pain back pain follow-up  [id number]  Kristie King is a 67 y.o. female coming in with complaint of neck and back pain.  Patient is having worsening back pain.  Found to have spinal stenosis at L4-L5.  Had failed all conservative therapy and was having radicular symptoms.  Patient was sent for an injection June 09, 2017.  Epidural at L4-L5.  Patient was to continue conservative therapy.  Patient states that she is doing better. She did have some tingling the day afterwards but her pain and tingling has decreased. Patient notes that since the epidural she has calf cramps bilaterally.     Past Medical History:  Diagnosis Date  . Arthritis    "knees; elbows" (09/17/2012)  . Dyslipidemia 09/17/2012  . HTN (hypertension) 09/17/2012  . Type II diabetes mellitus ([place])    Past Surgical History:  Procedure Laterality Date  . BUNIONECTOMY Right 02/18/13   [facility] Bunionectomy @ [place]  . BUNIONECTOMY Left 04/01/2013   [facility]    Social History   Socioeconomic History  . Marital status: Married    Spouse name: Not on file  . Number of children: Not on file  . Years of education: Not on file  . Highest education level: Not on file  Occupational History  . Not on file  Social Needs  . Financial resource strain: Not on file  . Food insecurity:    Worry: Not on file    Inability: Not on file  . Transportation needs:    Medical: Not on file    Non-medical: Not on file  Tobacco Use  . Smoking status: Former Smoker    Years: 31.00    Types: Cigarettes    Last attempt to quit: 01/14/2013    Years since quitting: 4.4  . Smokeless tobacco: Never Used  Substance and Sexual Activity  . Alcohol use: Yes    Comment: 09/17/2012 "drink on special occasions only"  . Drug use: No  . Sexual activity: Yes  Lifestyle  . Physical activity:    Days per week:  Not on file    Minutes per session: Not on file  . Stress: Not on file  Relationships  . Social connections:    Talks on phone: Not on file    Gets together: Not on file    Attends religious service: Not on file    Active member of club or organization: Not on file    Attends meetings of clubs or organizations: Not on file    Relationship status: Not on file  Other Topics Concern  . Not on file  Social History Narrative  . Not on file   No Known Allergies Family History  Problem Relation Age of Onset  . Hyperlipidemia Sister   . Diabetes Brother   . Hypertension Brother   . Diabetes Sister   . Hypertension Sister      Past medical history, social, surgical and family history all reviewed in electronic medical record.  No pertanent information unless stated regarding to the chief complaint.   Review of Systems:Review of systems updated and as accurate as of 07/01/17  No headache, visual changes, nausea, vomiting, diarrhea, constipation, dizziness, abdominal pain, skin rash, fevers, chills, night sweats, weight loss, swollen lymph nodes, body aches, joint swelling, chest pain, shortness of breath, mood changes.  Positive muscle aches and muscle cramps  Objective  Blood pressure 130/80, pulse 78, height 5\' 3"  (1.6 m), weight 154 lb (69.9 kg), SpO2 97 %. Systems examined below as of 07/01/17   General: No apparent distress alert and oriented x3 mood and affect normal, dressed appropriately.  HEENT: Pupils equal, extraocular movements intact  Respiratory: Patient's speak in full sentences and does not appear short of breath  Cardiovascular: No lower extremity edema, non tender, no erythema  Skin: Warm dry intact with no signs of infection or rash on extremities or on axial skeleton.  Abdomen: Soft nontender  Neuro: Cranial nerves II through XII are intact, neurovascularly intact in all extremities with 2+ DTRs and 2+ pulses.  Lymph: No lymphadenopathy of posterior or anterior  cervical chain or axillae bilaterally.  Gait normal with good balance and coordination.  MSK:  Non tender with full range of motion and good stability and symmetric strength and tone of shoulders, elbows, wrist, hip, knee and ankles bilaterally.   Back Exam:  Inspection: Loss of lordosis Motion: Flexion 40 deg, Extension 25 deg, Side Bending to 35 deg bilaterally,  Rotation to 35 deg bilaterally  SLR laying: Negative  XSLR laying: Negative  Palpable tenderness: Tender to palpation bilaterally in the paraspinal musculature. FABER: Positive Faber. Sensory change: Gross sensation intact to all lumbar and sacral dermatomes.  Reflexes: 2+ at both patellar tendons, 2+ at achilles tendons, Babinski's downgoing.  Strength at foot  Plantar-flexion: 5/5 Dorsi-flexion: 5/5 Eversion: 5/5 Inversion: 5/5  Leg strength  Quad: 5/5 Hamstring: 5/5 Hip flexor: 5/5 Hip abductors: 4/5 symmetric. Gait unremarkable.       Impression and Recommendations:     This case required medical decision making of moderate complexity.      Note: This dictation was prepared with Dragon dictation along with smaller phrase technology. Any transcriptional errors that result from this process are unintentional.

## 2017-07-01 ENCOUNTER — Other Ambulatory Visit (INDEPENDENT_AMBULATORY_CARE_PROVIDER_SITE_OTHER): Payer: Medicare HMO

## 2017-07-01 ENCOUNTER — Ambulatory Visit: Payer: Medicare HMO | Admitting: Family Medicine

## 2017-07-01 ENCOUNTER — Encounter: Payer: Self-pay | Admitting: Family Medicine

## 2017-07-01 VITALS — BP 130/80 | HR 78 | Ht 63.0 in | Wt 154.0 lb

## 2017-07-01 DIAGNOSIS — M48061 Spinal stenosis, lumbar region without neurogenic claudication: Secondary | ICD-10-CM

## 2017-07-01 DIAGNOSIS — R252 Cramp and spasm: Secondary | ICD-10-CM

## 2017-07-01 DIAGNOSIS — M2550 Pain in unspecified joint: Secondary | ICD-10-CM

## 2017-07-01 LAB — SEDIMENTATION RATE: Sed Rate: 20 mm/hr (ref 0–30)

## 2017-07-01 LAB — COMPREHENSIVE METABOLIC PANEL
ALT: 5 U/L (ref 0–35)
AST: 17 U/L (ref 0–37)
Albumin: 4.1 g/dL (ref 3.5–5.2)
Alkaline Phosphatase: 42 U/L (ref 39–117)
BILIRUBIN TOTAL: 0.7 mg/dL (ref 0.2–1.2)
BUN: 21 mg/dL (ref 6–23)
CALCIUM: 9.4 mg/dL (ref 8.4–10.5)
CO2: 29 meq/L (ref 19–32)
Chloride: 105 mEq/L (ref 96–112)
Creatinine, Ser: 0.77 mg/dL (ref 0.40–1.20)
GFR: 96.32 mL/min (ref >60.00)
Glucose, Bld: 83 mg/dL (ref 70–99)
Potassium: 3.7 mEq/L (ref 3.5–5.1)
Sodium: 141 mEq/L (ref 135–145)
Total Protein: 6.9 g/dL (ref 6.0–8.3)

## 2017-07-01 LAB — CBC WITH DIFFERENTIAL/PLATELET
BASOS ABS: 0 10*3/uL (ref 0.0–0.1)
Basophils Relative: 0.8 % (ref 0.0–3.0)
Eosinophils Absolute: 0.1 10*3/uL (ref 0.0–0.7)
Eosinophils Relative: 1.9 % (ref 0.0–5.0)
HEMATOCRIT: 33.4 % — AB (ref 36.0–46.0)
HEMOGLOBIN: 11 g/dL — AB (ref 12.0–15.0)
LYMPHS PCT: 32.2 % (ref 12.0–46.0)
Lymphs Abs: 1.8 10*3/uL (ref 0.7–4.0)
MCHC: 32.9 g/dL (ref 30.0–36.0)
MCV: 90.7 fl (ref 78.0–100.0)
Monocytes Absolute: 0.6 10*3/uL (ref 0.1–1.0)
Monocytes Relative: 10.1 % (ref 3.0–12.0)
NEUTROS ABS: 3 10*3/uL (ref 1.4–7.7)
Neutrophils Relative %: 55 % (ref 43.0–77.0)
PLATELETS: 212 10*3/uL (ref 150.0–400.0)
RBC: 3.68 Mil/uL — ABNORMAL LOW (ref 3.87–5.11)
RDW: 13.9 % (ref 11.5–15.5)
WBC: 5.5 10*3/uL (ref 4.0–10.5)

## 2017-07-01 LAB — IBC PANEL
Iron: 64 ug/dL (ref 42–145)
SATURATION RATIOS: 18.1 % — AB (ref 20.0–50.0)
TRANSFERRIN: 252 mg/dL (ref 212.0–360.0)

## 2017-07-01 LAB — URIC ACID: URIC ACID, SERUM: 7.5 mg/dL — AB (ref 2.4–7.0)

## 2017-07-01 LAB — C-REACTIVE PROTEIN: CRP: 0.1 mg/dL — ABNORMAL LOW (ref 0.5–20.0)

## 2017-07-01 LAB — VITAMIN D 25 HYDROXY (VIT D DEFICIENCY, FRACTURES): VITD: 16.03 ng/mL — ABNORMAL LOW (ref 30.00–100.00)

## 2017-07-01 LAB — TSH: TSH: 1.11 u[IU]/mL (ref 0.35–4.50)

## 2017-07-01 NOTE — Assessment & Plan Note (Addendum)
Improved after the epidural.  Discussed icing regimen and home exercise.  Discussed which activities to do which wants to avoid discussed with patient in great length to increase activity as tolerated.  Follow-up again in 4 to 6 weeks

## 2017-07-01 NOTE — Patient Instructions (Signed)
Good to see you  Kristie King is your friend. Ice 20 minutes 2 times daily. Usually after activity and before bed. We will get labs Tart cherry extract 1000mg  nightly could help  Increase activity as tolerated  3 times a week  See me again in 6 weeks

## 2017-07-01 NOTE — Assessment & Plan Note (Signed)
Concerned with continued cramping in the mornings.  Discussed with patient in great length.  Laboratory work-up to further evaluate to see if there is any other organic causes.  In addition of this patient may need a sleep study if we do not make improvement.  Does not seem to be associated with the lumbar radiculopathy now that patient has had the injection in the back pain is not numbness has improved.

## 2017-07-02 LAB — ANGIOTENSIN CONVERTING ENZYME: Angiotensin-Converting Enzyme: 17 U/L (ref 9–67)

## 2017-07-02 LAB — PTH, INTACT AND CALCIUM
Calcium: 9.5 mg/dL (ref 8.6–10.4)
PTH: 74 pg/mL — ABNORMAL HIGH (ref 14–64)

## 2017-07-02 LAB — CALCIUM, IONIZED: CALCIUM ION: 5.33 mg/dL (ref 4.8–5.6)

## 2017-07-02 LAB — CYCLIC CITRUL PEPTIDE ANTIBODY, IGG: Cyclic Citrullin Peptide Ab: <16 UNITS

## 2017-07-02 LAB — RHEUMATOID FACTOR

## 2017-07-03 LAB — ANA: Anti Nuclear Antibody(ANA): NEGATIVE

## 2017-08-04 ENCOUNTER — Other Ambulatory Visit: Payer: Self-pay

## 2017-08-04 MED ORDER — ALLOPURINOL 100 MG PO TABS: 200.0000 mg | ORAL_TABLET | Freq: Every day | ORAL | 3 refills | Status: DC

## 2017-08-11 NOTE — Progress Notes (Signed)
[name] [place], [place] [address] Phone: [phone] Subjective:      CC: Back pain.  [id number]  Kristie King is a 67 y.o. female coming in with complaint of back pain. Patient has been having good and bad days. Patient feels that the allopurinol helps when she takes it but hasn't been consistent with it.Patient was unable to get off couch this past weekend when she wasn't taking the allopurinol. Has used CBD oil and epsom salt baths. Pain has been intense in the back of upper and lower leg.  No lumbar spinal stenosis.  Last epidural was given June 09, 2017.  Seems to have helped the back pain but not helping as much with the tightness of the legs.  States long walking can cause more discomfort.       Past Medical History:  Diagnosis Date  . Arthritis    "knees; elbows" (09/17/2012)  . Dyslipidemia 09/17/2012  . HTN (hypertension) 09/17/2012  . Type II diabetes mellitus ([place])    Past Surgical History:  Procedure Laterality Date  . BUNIONECTOMY Right 02/18/13   [facility] Bunionectomy @ [place]  . BUNIONECTOMY Left 04/01/2013   [facility]    Social History   Socioeconomic History  . Marital status: Married    Spouse name: Not on file  . Number of children: Not on file  . Years of education: Not on file  . Highest education level: Not on file  Occupational History  . Not on file  Social Needs  . Financial resource strain: Not on file  . Food insecurity:    Worry: Not on file    Inability: Not on file  . Transportation needs:    Medical: Not on file    Non-medical: Not on file  Tobacco Use  . Smoking status: Former Smoker    Years: 31.00    Types: Cigarettes    Last attempt to quit: 01/14/2013    Years since quitting: 4.5  . Smokeless tobacco: Never Used  Substance and Sexual Activity  . Alcohol use: Yes    Comment: 09/17/2012 "drink on special occasions only"  . Drug use: No  . Sexual activity: Yes    Lifestyle  . Physical activity:    Days per week: Not on file    Minutes per session: Not on file  . Stress: Not on file  Relationships  . Social connections:    Talks on phone: Not on file    Gets together: Not on file    Attends religious service: Not on file    Active member of club or organization: Not on file    Attends meetings of clubs or organizations: Not on file    Relationship status: Not on file  Other Topics Concern  . Not on file  Social History Narrative  . Not on file   No Known Allergies Family History  Problem Relation Age of Onset  . Hyperlipidemia Sister   . Diabetes Brother   . Hypertension Brother   . Diabetes Sister   . Hypertension Sister      Past medical history, social, surgical and family history all reviewed in electronic medical record.  No pertanent information unless stated regarding to the chief complaint.   Review of Systems:Review of systems updated and as accurate as of 08/11/17  No headache, visual changes, nausea, vomiting, diarrhea, constipation, dizziness, abdominal pain, skin rash, fevers, chills, night sweats, weight loss, swollen lymph nodes, body aches,  joint swelling, muscle aches, chest pain, shortness of breath, mood changes.   Objective  There were no vitals taken for this visit. Systems examined below as of 08/11/17   General: No apparent distress alert and oriented x3 mood and affect normal, dressed appropriately.  HEENT: Pupils equal, extraocular movements intact  Respiratory: Patient's speak in full sentences and does not appear short of breath  Cardiovascular: No lower extremity edema, non tender, no erythema  Skin: Warm dry intact with no signs of infection or rash on extremities or on axial skeleton.  Abdomen: Soft nontender  Neuro: Cranial nerves II through XII are intact, neurovascularly intact in all extremities with 2+ DTRs and 2+ pulses.  Lymph: No lymphadenopathy of posterior or anterior cervical chain or  axillae bilaterally.  Gait normal with good balance and coordination.  MSK:  Non tender with full range of motion and good stability and symmetric strength and tone of shoulders, elbows, wrist, hip, knee and ankles bilaterally.  Back exam shows some loss of lumbar stenosis.  Loss of sidebending and rotation bilaterally.  Tightness with Corky Sox test.  Tightness of the hamstrings bilaterally but no radicular symptoms today.  No spinous process tenderness.  Neurovascular intact distally.  Osteopathic findings T5 extended rotated and side bent left L3 flexed rotated and side bent right Sacrum right on right    Impression and Recommendations:     This case required medical decision making of moderate complexity.      Note: This dictation was prepared with Dragon dictation along with smaller phrase technology. Any transcriptional errors that result from this process are unintentional.

## 2017-08-12 ENCOUNTER — Encounter: Payer: Self-pay | Admitting: Family Medicine

## 2017-08-12 ENCOUNTER — Ambulatory Visit: Payer: Medicare HMO | Admitting: Family Medicine

## 2017-08-12 DIAGNOSIS — M48061 Spinal stenosis, lumbar region without neurogenic claudication: Secondary | ICD-10-CM | POA: Diagnosis not present

## 2017-08-12 DIAGNOSIS — M999 Biomechanical lesion, unspecified: Secondary | ICD-10-CM | POA: Insufficient documentation

## 2017-08-12 NOTE — Assessment & Plan Note (Signed)
Moderate to severe.  Patient does not want to have any other epidural at this point and definitely does not want any surgical intervention.  Has not been very active and continuing the home exercises.  Attempted osteopathic manipulation we will see if this makes of improvement.  Follow-up again in 4 weeks

## 2017-08-12 NOTE — Patient Instructions (Signed)
Good to see you  Ice is your friend Consider cycling  See me again in 3-4 weeks

## 2017-08-12 NOTE — Assessment & Plan Note (Signed)
Decision today to treat with OMT was based on Physical Exam  After verbal consent patient was treated with HVLA, ME, FPR techniques in  thoracic, lumbar and sacral areas  Patient tolerated the procedure well with improvement in symptoms  Patient given exercises, stretches and lifestyle modifications  See medications in patient instructions if given  Patient will follow up in 4 weeks 

## 2017-09-01 NOTE — Progress Notes (Deleted)
[name] [place], [place] [address] Phone: [phone] Subjective:    I'm seeing this patient by the request  of:    CC:   [id number]  Kristie King is a 67 y.o. female coming in with complaint of ***  Onset-  Location Duration-  Character- Aggravating factors- Reliving factors-  Therapies tried-  Severity-     Past Medical History:  Diagnosis Date  . Arthritis    "knees; elbows" (09/17/2012)  . Dyslipidemia 09/17/2012  . HTN (hypertension) 09/17/2012  . Type II diabetes mellitus ([place])    Past Surgical History:  Procedure Laterality Date  . BUNIONECTOMY Right 02/18/13   [facility] @ [facility]  . BUNIONECTOMY Left 04/01/2013   [facility]    Social History   Socioeconomic History  . Marital status: Married    Spouse name: Not on file  . Number of children: Not on file  . Years of education: Not on file  . Highest education level: Not on file  Occupational History  . Not on file  Social Needs  . Financial resource strain: Not on file  . Food insecurity:    Worry: Not on file    Inability: Not on file  . Transportation needs:    Medical: Not on file    Non-medical: Not on file  Tobacco Use  . Smoking status: Former Smoker    Years: 31.00    Types: Cigarettes    Last attempt to quit: 01/14/2013    Years since quitting: 4.6  . Smokeless tobacco: Never Used  Substance and Sexual Activity  . Alcohol use: Yes    Comment: 09/17/2012 "drink on special occasions only"  . Drug use: No  . Sexual activity: Yes  Lifestyle  . Physical activity:    Days per week: Not on file    Minutes per session: Not on file  . Stress: Not on file  Relationships  . Social connections:    Talks on phone: Not on file    Gets together: Not on file    Attends religious service: Not on file    Active member of club or organization: Not on file    Attends meetings of clubs or organizations: Not on file    Relationship  status: Not on file  Other Topics Concern  . Not on file  Social History Narrative  . Not on file   No Known Allergies Family History  Problem Relation Age of Onset  . Hyperlipidemia Sister   . Diabetes Brother   . Hypertension Brother   . Diabetes Sister   . Hypertension Sister      Past medical history, social, surgical and family history all reviewed in electronic medical record.  No pertanent information unless stated regarding to the chief complaint.   Review of Systems:Review of systems updated and as accurate as of 09/01/17  No headache, visual changes, nausea, vomiting, diarrhea, constipation, dizziness, abdominal pain, skin rash, fevers, chills, night sweats, weight loss, swollen lymph nodes, body aches, joint swelling, muscle aches, chest pain, shortness of breath, mood changes.   Objective  There were no vitals taken for this visit. Systems examined below as of 09/01/17   General: No apparent distress alert and oriented x3 mood and affect normal, dressed appropriately.  HEENT: Pupils equal, extraocular movements intact  Respiratory: Patient's speak in full sentences and does not appear short of breath  Cardiovascular: No lower extremity edema, non tender, no erythema  Skin:  Warm dry intact with no signs of infection or rash on extremities or on axial skeleton.  Abdomen: Soft nontender  Neuro: Cranial nerves II through XII are intact, neurovascularly intact in all extremities with 2+ DTRs and 2+ pulses.  Lymph: No lymphadenopathy of posterior or anterior cervical chain or axillae bilaterally.  Gait normal with good balance and coordination.  MSK:  Non tender with full range of motion and good stability and symmetric strength and tone of shoulders, elbows, wrist, hip, knee and ankles bilaterally.     Impression and Recommendations:     This case required medical decision making of moderate complexity.      Note: This dictation was prepared with Dragon dictation  along with smaller phrase technology. Any transcriptional errors that result from this process are unintentional.

## 2017-09-02 ENCOUNTER — Ambulatory Visit: Payer: Self-pay | Admitting: Family Medicine

## 2017-09-16 DIAGNOSIS — M1000 Idiopathic gout, unspecified site: Secondary | ICD-10-CM | POA: Diagnosis not present

## 2017-09-16 DIAGNOSIS — N182 Chronic kidney disease, stage 2 (mild): Secondary | ICD-10-CM | POA: Diagnosis not present

## 2017-09-16 DIAGNOSIS — I119 Hypertensive heart disease without heart failure: Secondary | ICD-10-CM | POA: Diagnosis not present

## 2017-09-16 DIAGNOSIS — E1165 Type 2 diabetes mellitus with hyperglycemia: Secondary | ICD-10-CM | POA: Diagnosis not present

## 2017-09-16 DIAGNOSIS — Z79899 Other long term (current) drug therapy: Secondary | ICD-10-CM | POA: Diagnosis not present

## 2017-09-16 DIAGNOSIS — D860 Sarcoidosis of lung: Secondary | ICD-10-CM | POA: Diagnosis not present

## 2017-09-16 DIAGNOSIS — E7800 Pure hypercholesterolemia, unspecified: Secondary | ICD-10-CM | POA: Diagnosis not present

## 2017-09-16 DIAGNOSIS — Z6826 Body mass index (BMI) 26.0-26.9, adult: Secondary | ICD-10-CM | POA: Diagnosis not present

## 2017-09-16 DIAGNOSIS — M2550 Pain in unspecified joint: Secondary | ICD-10-CM | POA: Diagnosis not present

## 2017-09-17 ENCOUNTER — Ambulatory Visit: Payer: Medicare HMO | Admitting: Family Medicine

## 2017-09-17 ENCOUNTER — Encounter: Payer: Self-pay | Admitting: Family Medicine

## 2017-09-17 VITALS — BP 128/72 | HR 67 | Ht 63.0 in | Wt 153.0 lb

## 2017-09-17 DIAGNOSIS — M999 Biomechanical lesion, unspecified: Secondary | ICD-10-CM

## 2017-09-17 DIAGNOSIS — M48061 Spinal stenosis, lumbar region without neurogenic claudication: Secondary | ICD-10-CM | POA: Diagnosis not present

## 2017-09-17 DIAGNOSIS — M545 Low back pain: Secondary | ICD-10-CM | POA: Diagnosis not present

## 2017-09-17 NOTE — Progress Notes (Signed)
[facility] [place], [place] [address] Phone: [phone] Subjective:    I'm seeing this patient by the request  of:    CC: Back and neck pain  [id number]  Kristie King is a 67 y.o. female coming in with complaint of back and neck pain. She said that her legs are still bothering her, bilateral calves. She is using the allopurinol. She also is using meloxicam for pain as well as CBD oil. Continues to have lower back pain. Is wondering what she can do with her personal trainer. She would also like to know if dancing could be contributing to her pain. Does participate in line dancing. Uses gabapentin at night to sleep.  History of severe spinal stenosis.     Past Medical History:  Diagnosis Date  . Arthritis    "knees; elbows" (09/17/2012)  . Dyslipidemia 09/17/2012  . HTN (hypertension) 09/17/2012  . Type II diabetes mellitus ([place])    Past Surgical History:  Procedure Laterality Date  . BUNIONECTOMY Right 02/18/13   [facility]union[facility] @ [facility]  . BUNIONECTOMY Left 04/01/2013   [facility]    Social History   Socioeconomic History  . Marital status: Married    Spouse name: Not on file  . Number of children: Not on file  . Years of education: Not on file  . Highest education level: Not on file  Occupational History  . Not on file  Social Needs  . Financial resource strain: Not on file  . Food insecurity:    Worry: Not on file    Inability: Not on file  . Transportation needs:    Medical: Not on file    Non-medical: Not on file  Tobacco Use  . Smoking status: Former Smoker    Years: 31.00    Types: Cigarettes    Last attempt to quit: 01/14/2013    Years since quitting: 4.6  . Smokeless tobacco: Never Used  Substance and Sexual Activity  . Alcohol use: Yes    Comment: 09/17/2012 "drink on special occasions only"  . Drug use: No  . Sexual activity: Yes  Lifestyle  . Physical activity:    Days per week: Not on  file    Minutes per session: Not on file  . Stress: Not on file  Relationships  . Social connections:    Talks on phone: Not on file    Gets together: Not on file    Attends religious service: Not on file    Active member of club or organization: Not on file    Attends meetings of clubs or organizations: Not on file    Relationship status: Not on file  Other Topics Concern  . Not on file  Social History Narrative  . Not on file   No Known Allergies Family History  Problem Relation Age of Onset  . Hyperlipidemia Sister   . Diabetes Brother   . Hypertension Brother   . Diabetes Sister   . Hypertension Sister      Past medical history, social, surgical and family history all reviewed in electronic medical record.  No pertanent information unless stated regarding to the chief complaint.   Review of Systems:Review of systems updated and as accurate as of 09/17/17  No headache, visual changes, nausea, vomiting, diarrhea, constipation, dizziness, abdominal pain, skin rash, fevers, chills, night sweats, weight loss, swollen lymph nodes, body aches, joint swelling, chest pain, shortness of breath, mood changes.  Positive muscle aches  Objective  Blood pressure 128/72, pulse 67, height 5\' 3"  (1.6 m), weight 153 lb (69.4 kg), SpO2 98 %. Systems examined below as of 09/17/17   General: No apparent distress alert and oriented x3 mood and affect normal, dressed appropriately.  HEENT: Pupils equal, extraocular movements intact  Respiratory: Patient's speak in full sentences and does not appear short of breath  Cardiovascular: No lower extremity edema, non tender, no erythema  Skin: Warm dry intact with no signs of infection or rash on extremities or on axial skeleton.  Abdomen: Soft nontender  Neuro: Cranial nerves II through XII are intact, neurovascularly intact in all extremities with 2+ DTRs and 2+ pulses.  Lymph: No lymphadenopathy of posterior or anterior cervical chain or axillae  bilaterally.  Gait normal with good balance and coordination.  MSK:  Non tender with full range of motion and good stability and symmetric strength and tone of shoulders, elbows, wrist, hip, knee and ankles bilaterally.   Neck: Inspection loss of lordosis. No palpable stepoffs. Negative Spurling's maneuver. Full neck range of motion Grip strength and sensation normal in bilateral hands Strength good C4 to T1 distribution No sensory change to C4 to T1 Negative Hoffman sign bilaterally Reflexes normal Tightness of the trapezius bilaterally  Lower back exam shows loss of lordosis.  Patient has loss of range of motion patient does have mild positive Corky Sox.  Tightness of the hamstrings bilaterally but no true radicular symptoms.  4+ out of 5 strength in lower extremities but symmetric.  Osteopathic findings C2 flexed rotated and side bent right C7 flexed rotated and side bent left T3 extended rotated and side bent right inhaled third rib T7 extended rotated and side bent left L3 flexed rotated and side bent right Sacrum right on right     Impression and Recommendations:     This case required medical decision making of moderate complexity.      Note: This dictation was prepared with Dragon dictation along with smaller phrase technology. Any transcriptional errors that result from this process are unintentional.

## 2017-09-17 NOTE — Assessment & Plan Note (Signed)
Spinal stenosis.  Has responded well to manipulation.  Has had an exacerbation as well previously.  Continue the same medication encouraged him to take the gabapentin on a more regular basis.  Follow-up again in 4 to 6 weeks

## 2017-09-17 NOTE — Patient Instructions (Signed)
Good to see you  Gabapentin 1-3 pills at night Continue all other meds Drop weight and increase weights as we discussed Listen to your body  See me again in 4-5 weeks

## 2017-09-17 NOTE — Assessment & Plan Note (Signed)
Decision today to treat with OMT was based on Physical Exam  After verbal consent patient was treated with HVLA, ME, FPR techniques in cervical, thoracic, rib, lumbar and sacral areas  Patient tolerated the procedure well with improvement in symptoms  Patient given exercises, stretches and lifestyle modifications  See medications in patient instructions if given  Patient will follow up in 4-6 weeks 

## 2017-10-02 ENCOUNTER — Ambulatory Visit: Payer: Medicare HMO | Admitting: Family Medicine

## 2017-10-02 ENCOUNTER — Encounter: Payer: Self-pay | Admitting: Family Medicine

## 2017-10-02 VITALS — BP 136/70 | HR 75 | Ht 63.0 in | Wt 155.0 lb

## 2017-10-02 DIAGNOSIS — M48061 Spinal stenosis, lumbar region without neurogenic claudication: Secondary | ICD-10-CM

## 2017-10-02 DIAGNOSIS — M5416 Radiculopathy, lumbar region: Secondary | ICD-10-CM

## 2017-10-02 MED ORDER — PREDNISONE 50 MG PO TABS: 50.0000 mg | ORAL_TABLET | Freq: Every day | ORAL | 0 refills | Status: DC

## 2017-10-02 MED ORDER — METHYLPREDNISOLONE ACETATE 80 MG/ML IJ SUSP
80.0000 mg | Freq: Once | INTRAMUSCULAR | Status: AC
  Administered 2017-10-02: 80 mg via INTRAMUSCULAR

## 2017-10-02 MED ORDER — KETOROLAC TROMETHAMINE 60 MG/2ML IM SOLN
60.0000 mg | Freq: Once | INTRAMUSCULAR | Status: AC
  Administered 2017-10-02: 60 mg via INTRAMUSCULAR

## 2017-10-02 NOTE — Assessment & Plan Note (Signed)
Moderate to severe.  Worsening symptoms at this time.  Discussed icing regimen and home exercise.  Discussed which activities to do which was to avoid.  Patient is to increase activity slowly over the course the next several days.  Toradol and Depo-Medrol given today.  No significant improvement take prednisone.  Also could not repeat the epidural.  Hold on manipulation until patient is near baseline.  Follow-up again 1 to 5 weeks

## 2017-10-02 NOTE — Patient Instructions (Signed)
Good to see you  2 injections today  I hope this helps Worsening pain over the weekend take the prednisone daily for 5 days Contact me on Monday and if worse or no better I will order another epidural  IF we get the epidural then see me again 2-3 weeks AFTER

## 2017-10-02 NOTE — Progress Notes (Signed)
[name] [place], [place] [address] Phone: [phone] Subjective:    CC: Back pain  [id number]  Kristie King is a 67 y.o. female coming in with complaint of back pain. The back is painful today. States she has gout in her legs. Was in a dance class Tuesday when she felt pain.  Patient states it is severe.  Known to have severe spinal stenosis.  Has been responding to the epidurals and has had some benefit for weeks to months with 2 different injections.  Worsening pain with radiation going down both legs.      Past Medical History:  Diagnosis Date  . Arthritis    "knees; elbows" (09/17/2012)  . Dyslipidemia 09/17/2012  . HTN (hypertension) 09/17/2012  . Type II diabetes mellitus ([place])    Past Surgical History:  Procedure Laterality Date  . BUNIONECTOMY Right 02/18/13   [facility] Bunion[facility] @ [facility]  . BUNIONECTOMY Left 04/01/2013   [facility]    Social History   Socioeconomic History  . Marital status: Married    Spouse name: Not on file  . Number of children: Not on file  . Years of education: Not on file  . Highest education level: Not on file  Occupational History  . Not on file  Social Needs  . Financial resource strain: Not on file  . Food insecurity:    Worry: Not on file    Inability: Not on file  . Transportation needs:    Medical: Not on file    Non-medical: Not on file  Tobacco Use  . Smoking status: Former Smoker    Years: 31.00    Types: Cigarettes    Last attempt to quit: 01/14/2013    Years since quitting: 4.7  . Smokeless tobacco: Never Used  Substance and Sexual Activity  . Alcohol use: Yes    Comment: 09/17/2012 "drink on special occasions only"  . Drug use: No  . Sexual activity: Yes  Lifestyle  . Physical activity:    Days per week: Not on file    Minutes per session: Not on file  . Stress: Not on file  Relationships  . Social connections:    Talks on phone: Not on file    Gets  together: Not on file    Attends religious service: Not on file    Active member of club or organization: Not on file    Attends meetings of clubs or organizations: Not on file    Relationship status: Not on file  Other Topics Concern  . Not on file  Social History Narrative  . Not on file   No Known Allergies Family History  Problem Relation Age of Onset  . Hyperlipidemia Sister   . Diabetes Brother   . Hypertension Brother   . Diabetes Sister   . Hypertension Sister      Past medical history, social, surgical and family history all reviewed in electronic medical record.  No pertanent information unless stated regarding to the chief complaint.   Review of Systems:Review of systems updated and as accurate as of 10/02/17  No headache, visual changes, nausea, vomiting, diarrhea, constipation, dizziness, abdominal pain, skin rash, fevers, chills, night sweats, weight loss, swollen lymph nodes, body aches, joint swelling, chest pain, shortness of breath, mood changes.  Positive muscle aches  Objective  Blood pressure 136/70, pulse 75, height 5\' 3"  (1.6 m), weight 155 lb (70.3 kg), SpO2 98 %. Systems examined below as of  10/02/17   General: No apparent distress alert and oriented x3 mood and affect normal, dressed appropriately.  HEENT: Pupils equal, extraocular movements intact  Respiratory: Patient's speak in full sentences and does not appear short of breath  Cardiovascular: No lower extremity edema, non tender, no erythema  Skin: Warm dry intact with no signs of infection or rash on extremities or on axial skeleton.  Abdomen: Soft nontender  Neuro: Cranial nerves II through XII are intact, neurovascularly intact in all extremities with 2+ DTRs and 2+ pulses.  Lymph: No lymphadenopathy of posterior or anterior cervical chain or axillae bilaterally.  Gait normal with good balance and coordination.  MSK:  Non tender with full range of motion and good stability and symmetric  strength and tone of shoulders, elbows, wrist, hip, knee and ankles bilaterally.  Back Exam:  Inspection: Loss of lordosis Motion: Flexion 25 deg, Extension 15 deg worsening radicular symptoms, Side Bending to 25 deg bilaterally,  Rotation to 25 deg bilaterally  SLR laying: Negative  XSLR laying: Negative  Palpable tenderness: Moderate to severe tenderness to palpation the paraspinal musculature lumbar spine bilaterally.Marland Kitchen FABER: Positive Faber bilaterally. Sensory change: Gross sensation intact to all lumbar and sacral dermatomes.  Reflexes: 2+ at both patellar tendons, 2+ at achilles tendons, Babinski's downgoing.  Strength at foot  4+5 strength but symmetric.     Impression and Recommendations:     This case required medical decision making of moderate complexity.      Note: This dictation was prepared with Dragon dictation along with smaller phrase technology. Any transcriptional errors that result from this process are unintentional.

## 2017-10-08 ENCOUNTER — Telehealth: Payer: Self-pay

## 2017-10-08 NOTE — Telephone Encounter (Signed)
Patient called in as she continues to have pain in her back and legs. She has not been dancing or doing workouts with her personal trainer. Despite resting she has not improved with prednisone and the injection she received last week. Told patient I will speak with Dr. Tamala Julian in order to get her set up with another epidural as he indicated in his last visit note. Patient voices understanding.

## 2017-10-09 ENCOUNTER — Other Ambulatory Visit: Payer: Self-pay

## 2017-10-09 DIAGNOSIS — M5416 Radiculopathy, lumbar region: Secondary | ICD-10-CM

## 2017-10-09 NOTE — Telephone Encounter (Signed)
Order placed

## 2017-10-13 DIAGNOSIS — R69 Illness, unspecified: Secondary | ICD-10-CM | POA: Diagnosis not present

## 2017-10-14 ENCOUNTER — Ambulatory Visit: Payer: Self-pay | Admitting: Family Medicine

## 2017-10-20 DIAGNOSIS — Z124 Encounter for screening for malignant neoplasm of cervix: Secondary | ICD-10-CM | POA: Diagnosis not present

## 2017-10-20 DIAGNOSIS — Z01419 Encounter for gynecological examination (general) (routine) without abnormal findings: Secondary | ICD-10-CM | POA: Diagnosis not present

## 2017-10-20 DIAGNOSIS — R1900 Intra-abdominal and pelvic swelling, mass and lump, unspecified site: Secondary | ICD-10-CM | POA: Diagnosis not present

## 2017-10-21 ENCOUNTER — Ambulatory Visit
Admission: RE | Admit: 2017-10-21 | Discharge: 2017-10-21 | Disposition: A | Discharge status: Home / Self Care | Payer: Medicare HMO | Source: Ambulatory Visit | Attending: Family Medicine | Admitting: Family Medicine

## 2017-10-21 DIAGNOSIS — Z124 Encounter for screening for malignant neoplasm of cervix: Secondary | ICD-10-CM | POA: Diagnosis not present

## 2017-10-21 DIAGNOSIS — M545 Low back pain: Secondary | ICD-10-CM | POA: Diagnosis not present

## 2017-10-21 DIAGNOSIS — M5416 Radiculopathy, lumbar region: Secondary | ICD-10-CM

## 2017-10-21 MED ORDER — IOPAMIDOL (ISOVUE-M 200) INJECTION 41%
1.0000 mL | Freq: Once | INTRAMUSCULAR | Status: AC
  Administered 2017-10-21: 1 mL via EPIDURAL

## 2017-10-21 MED ORDER — METHYLPREDNISOLONE ACETATE 40 MG/ML INJ SUSP (RADIOLOG
120.0000 mg | Freq: Once | INTRAMUSCULAR | Status: AC
  Administered 2017-10-21: 120 mg via EPIDURAL

## 2017-10-21 NOTE — Discharge Instructions (Signed)

## 2017-10-24 DIAGNOSIS — R1900 Intra-abdominal and pelvic swelling, mass and lump, unspecified site: Secondary | ICD-10-CM | POA: Diagnosis not present

## 2017-10-24 DIAGNOSIS — R1907 Generalized intra-abdominal and pelvic swelling, mass and lump: Secondary | ICD-10-CM | POA: Diagnosis not present

## 2017-10-26 DIAGNOSIS — T63461A Toxic effect of venom of wasps, accidental (unintentional), initial encounter: Secondary | ICD-10-CM | POA: Diagnosis not present

## 2017-11-02 NOTE — Progress Notes (Signed)
[name] [place], [place] [address] Phone: [phone] Subjective:     I Kristie King am serving as a Education administrator for Dr. Hulan Saas.  CC: Back painback pain   [id number]  Kristie King is a 67 y.o. female coming in with complaint of back pain. Today is a follow up of her epidural. States that she is feeling much better. Hasn't resumed the exercises yet. Has had a few days of bilateral leg pain. Pain is posterior down the the feet. Was very hard to walk. Has been using CBD oil and walking to relieve pain. States that it felt like something was "holding her legs/wrapped around her legs".  Hx of spinal stenosis, has responded well to epidural in the past  Last one 8/20.     Past Medical History:  Diagnosis Date  . Arthritis    "knees; elbows" (09/17/2012)  . Dyslipidemia 09/17/2012  . HTN (hypertension) 09/17/2012  . Type II diabetes mellitus ([place])    Past Surgical History:  Procedure Laterality Date  . BUNIONECTOMY Right 02/18/13   [facility] Bunionectomy @ [facility]  . BUNIONECTOMY Left 04/01/2013   [facility]    Social History   Socioeconomic History  . Marital status: Married    Spouse name: Not on file  . Number of children: Not on file  . Years of education: Not on file  . Highest education level: Not on file  Occupational History  . Not on file  Social Needs  . Financial resource strain: Not on file  . Food insecurity:    Worry: Not on file    Inability: Not on file  . Transportation needs:    Medical: Not on file    Non-medical: Not on file  Tobacco Use  . Smoking status: Former Smoker    Years: 31.00    Types: Cigarettes    Last attempt to quit: 01/14/2013    Years since quitting: 4.8  . Smokeless tobacco: Never Used  Substance and Sexual Activity  . Alcohol use: Yes    Comment: 09/17/2012 "drink on special occasions only"  . Drug use: No  . Sexual activity: Yes  Lifestyle  . Physical activity:    Days  per week: Not on file    Minutes per session: Not on file  . Stress: Not on file  Relationships  . Social connections:    Talks on phone: Not on file    Gets together: Not on file    Attends religious service: Not on file    Active member of club or organization: Not on file    Attends meetings of clubs or organizations: Not on file    Relationship status: Not on file  Other Topics Concern  . Not on file  Social History Narrative  . Not on file   No Known Allergies Family History  Problem Relation Age of Onset  . Hyperlipidemia Sister   . Diabetes Brother   . Hypertension Brother   . Diabetes Sister   . Hypertension Sister     Current Outpatient Medications (Endocrine & Metabolic):  .  predniSONE (DELTASONE) 50 MG tablet, Take 1 tablet (50 mg total) by mouth daily. .  sitaGLIPtan-metformin (JANUMET) 50-1000 MG per tablet, TAKES Monday, Wednesday, Friday.  Current Outpatient Medications (Cardiovascular):  .  amLODipine (NORVASC) 10 MG tablet, Take 5 mg by mouth. Half tab on Mon, Wed, and Fri. Marland Kitchen  atorvastatin (LIPITOR) 20 MG tablet, Take 20 mg by mouth  at bedtime. .  furosemide (LASIX) 40 MG tablet, Take 20 mg by mouth daily. 20mg  on M,W,F  Current Outpatient Medications (Respiratory):  .  fluticasone (FLONASE) 50 MCG/ACT nasal spray, Place 2 sprays into both nostrils daily. .  fluticasone (VERAMYST) 27.5 MCG/SPRAY nasal spray, Place 2 sprays into both nostrils as needed.   Current Outpatient Medications (Analgesics):  .  aspirin EC 81 MG tablet, Take 81 mg by mouth every Monday, Wednesday, and Friday.  Marland Kitchen  allopurinol (ZYLOPRIM) 300 MG tablet, Take 1 tablet (300 mg total) by mouth daily.   Current Outpatient Medications (Other):  .  gabapentin (NEURONTIN) 100 MG capsule, Take 2 capsules (200 mg total) by mouth at bedtime. .  Vitamin D, Ergocalciferol, (DRISDOL) 50000 units CAPS capsule, Take 1 capsule (50,000 Units total) by mouth every 7 (seven) days.    Past medical  history, social, surgical and family history all reviewed in electronic medical record.  No pertanent information unless stated regarding to the chief complaint.   Review of Systems:  No headache, visual changes, nausea, vomiting, diarrhea, constipation, dizziness, abdominal pain, skin rash, fevers, chills, night sweats, weight loss, swollen lymph nodes, body aches, joint swelling, muscle aches, chest pain, shortness of breath, mood changes.   Objective  Blood pressure 110/60, pulse 77, height 5\' 3"  (1.6 m), weight 151 lb (68.5 kg), SpO2 98 %.  General: No apparent distress alert and oriented x3 mood and affect normal, dressed appropriately.  HEENT: Pupils equal, extraocular movements intact  Respiratory: Patient's speak in full sentences and does not appear short of breath  Cardiovascular: No lower extremity edema, non tender, no erythema  Skin: Warm dry intact with no signs of infection or rash on extremities or on axial skeleton.  Abdomen: Soft nontender  Neuro: Cranial nerves II through XII are intact, neurovascularly intact in all extremities with 2+ DTRs and 2+ pulses.  Lymph: No lymphadenopathy of posterior or anterior cervical chain or axillae bilaterally.  Gait normal with good balance and coordination.  MSK:  Non tender with full range of motion and good stability and symmetric strength and tone of shoulders, elbows, wrist, hip, knee and ankles bilaterally.  Back Exam:  Inspection: Loss of lordosis Motion: Flexion 35 deg, Extension 20 deg, Side Bending to 35 deg bilaterally,  Rotation to 35 deg bilaterally  SLR laying: Negative  XSLR laying: Negative  Palpable tenderness: Tender to palpation the paraspinal musculature lumbar spine right greater than left. FABER: Positive Faber on the right. Sensory change: Gross sensation intact to all lumbar and sacral dermatomes.  Reflexes: 2+ at both patellar tendons, 2+ at achilles tendons, Babinski's downgoing.  Strength at foot    Plantar-flexion: 5/5 Dorsi-flexion: 5/5 Eversion: 5/5 Inversion: 5/5  Leg strength  Quad: 5/5 Hamstring: 5/5 Hip flexor: 5/5 Hip abductors: 4/5 but symmetric      Impression and Recommendations:     This case required medical decision making of moderate complexity. The above documentation has been reviewed and is accurate and complete Kristie Pulley, DO       Note: This dictation was prepared with Dragon dictation along with smaller phrase technology. Any transcriptional errors that result from this process are unintentional.

## 2017-11-04 ENCOUNTER — Ambulatory Visit: Payer: Medicare HMO | Admitting: Family Medicine

## 2017-11-04 ENCOUNTER — Encounter: Payer: Self-pay | Admitting: Family Medicine

## 2017-11-04 DIAGNOSIS — R252 Cramp and spasm: Secondary | ICD-10-CM

## 2017-11-04 MED ORDER — ALLOPURINOL 300 MG PO TABS: 300.0000 mg | ORAL_TABLET | Freq: Every day | ORAL | 6 refills | Status: DC

## 2017-11-04 MED ORDER — VITAMIN D (ERGOCALCIFEROL) 1.25 MG (50000 UNIT) PO CAPS: 50000.0000 [IU] | ORAL_CAPSULE | ORAL | 0 refills | Status: DC

## 2017-11-04 NOTE — Patient Instructions (Addendum)
Good to see you  Ice is your friend OK to lift on machines but 50% of what you were doing.  Or start with pilates or Yoga 3-4 times a week Iron and vitamin C now 3 times a week Vitamin D now once a week for 12 weeks Increase allopurionl to 300mg  daily  Lets see you again in 3 weeks and make sure you are making improvement.

## 2017-11-04 NOTE — Assessment & Plan Note (Signed)
Worsening, hx of spinal stenosis and iron deficiency  Increase iron,  Gabapentin and allopurionl

## 2017-11-24 DIAGNOSIS — D259 Leiomyoma of uterus, unspecified: Secondary | ICD-10-CM | POA: Diagnosis not present

## 2017-11-24 NOTE — Progress Notes (Signed)
[name] [address] [place], [place] [address] Phone: [phone] Subjective:    I Kristie King am serving as a Education administrator for Dr. Hulan Saas.    CC: Back pain follow-up  TDD:[id number]  Kristie King is a 67 y.o. female coming in with complaint of back pain. States that her back has been up and down. Has been having a lot of issues lately. Lower back and leg pain. Has been taking the iron. Last week was her best week. Calf cramps. Toes are painful. Feels like something is crawling near the shin. Tires quickly. Since her last epidural she hasn't felt like herself. Would like to explore other options. Doesn't feel 100%.  Patient has had less and less benefit from the epidurals.  Patient was found to have increase uric acid, low vitamin D and low iron.  Patient is feels like she still does not have any energy.  Last time these were checked with quite some time ago with the labs, greater than 5 months.  Patient's MRI has shown moderate spinal stenosis.  Patient wants to avoid surgical intervention but feels like something more aggressive does need to be done.  Feels like her legs seem to exhaust more quickly.    Past Medical History:  Diagnosis Date  . Arthritis    "knees; elbows" (09/17/2012)  . Dyslipidemia 09/17/2012  . HTN (hypertension) 09/17/2012  . Type II diabetes mellitus ([place])    Past Surgical History:  Procedure Laterality Date  . BUNIONECTOMY Right 02/18/13   [facility] Bunionectomy @ [facility]  . BUNIONECTOMY Left 04/01/2013   Altamese Burgess @[facility]    Social History   Socioeconomic History  . Marital status: Married    Spouse name: Not on file  . Number of children: Not on file  . Years of education: Not on file  . Highest education level: Not on file  Occupational History  . Not on file  Social Needs  . Financial resource strain: Not on file  . Food insecurity:    Worry: Not on file    Inability: Not on file  . Transportation needs:   Medical: Not on file    Non-medical: Not on file  Tobacco Use  . Smoking status: Former Smoker    Years: 31.00    Types: Cigarettes    Last attempt to quit: 01/14/2013    Years since quitting: 4.8  . Smokeless tobacco: Never Used  Substance and Sexual Activity  . Alcohol use: Yes    Comment: 09/17/2012 "drink on special occasions only"  . Drug use: No  . Sexual activity: Yes  Lifestyle  . Physical activity:    Days per week: Not on file    Minutes per session: Not on file  . Stress: Not on file  Relationships  . Social connections:    Talks on phone: Not on file    Gets together: Not on file    Attends religious service: Not on file    Active member of club or organization: Not on file    Attends meetings of clubs or organizations: Not on file    Relationship status: Not on file  Other Topics Concern  . Not on file  Social History Narrative  . Not on file   No Known Allergies Family History  Problem Relation Age of Onset  . Hyperlipidemia Sister   . Diabetes Brother   . Hypertension Brother   . Diabetes Sister   . Hypertension Sister  Current Outpatient Medications (Endocrine & Metabolic):  .  predniSONE (DELTASONE) 50 MG tablet, Take 1 tablet (50 mg total) by mouth daily. .  sitaGLIPtan-metformin (JANUMET) 50-1000 MG per tablet, TAKES Monday, Wednesday, Friday.  Current Outpatient Medications (Cardiovascular):  .  amLODipine (NORVASC) 10 MG tablet, Take 5 mg by mouth. Half tab on Mon, Wed, and Fri. Marland Kitchen  atorvastatin (LIPITOR) 20 MG tablet, Take 20 mg by mouth at bedtime. .  furosemide (LASIX) 40 MG tablet, Take 20 mg by mouth daily. 20mg  on M,W,F  Current Outpatient Medications (Respiratory):  .  fluticasone (FLONASE) 50 MCG/ACT nasal spray, Place 2 sprays into both nostrils daily. .  fluticasone (VERAMYST) 27.5 MCG/SPRAY nasal spray, Place 2 sprays into both nostrils as needed.   Current Outpatient Medications (Analgesics):  .  allopurinol (ZYLOPRIM) 300 MG  tablet, Take 1 tablet (300 mg total) by mouth daily. Marland Kitchen  aspirin EC 81 MG tablet, Take 81 mg by mouth every Monday, Wednesday, and Friday.    Current Outpatient Medications (Other):  .  gabapentin (NEURONTIN) 100 MG capsule, Take 2 capsules (200 mg total) by mouth at bedtime. .  Vitamin D, Ergocalciferol, (DRISDOL) 50000 units CAPS capsule, Take 1 capsule (50,000 Units total) by mouth every 7 (seven) days.    Past medical history, social, surgical and family history all reviewed in electronic medical record.  No pertanent information unless stated regarding to the chief complaint.   Review of Systems:  No headache, visual changes, nausea, vomiting, diarrhea, constipation, dizziness, abdominal pain, skin rash, fevers, chills, night sweats, weight loss, swollen lymph nodes,chest pain, shortness of breath, mood changes.  Positive muscle aches, body aches  Objective  Blood pressure 110/78, pulse 75, height 5\' 3"  (1.6 m), weight 147 lb (66.7 kg), SpO2 98 %.    General: No apparent distress alert and oriented x3 mood and affect normal, dressed appropriately.  HEENT: Pupils equal, extraocular movements intact  Respiratory: Patient's speak in full sentences and does not appear short of breath  Cardiovascular: No lower extremity edema, non tender, no erythema  Skin: Warm dry intact with no signs of infection or rash on extremities or on axial skeleton.  Abdomen: Soft nontender  Neuro: Cranial nerves II through XII are intact, neurovascularly intact in all extremities with 2+ DTRs and 2+ pulses.  Lymph: No lymphadenopathy of posterior or anterior cervical chain or axillae bilaterally.  Gait normal with good balance and coordination.  MSK:  Non tender with full range of motion and good stability and symmetric strength and tone of shoulders, elbows, wrist, hip, knee and ankles bilaterally.  Back Exam:  Inspection: Loss of lordosis Motion: Flexion 45 deg, Extension 25 deg, Side Bending to 35 deg  bilaterally,  Rotation to 30 deg bilaterally  SLR laying: Severe tightness with radicular symptoms on the right leg XSLR laying: Negative  Palpable tenderness: Moderate to severe tenderness to palpation the paraspinal musculature lumbar spine right greater than left. FABER: Significant tightness from patient's previous exams. Sensory change: Gross sensation intact to all lumbar and sacral dermatomes.  Reflexes: 2+ at both patellar tendons, 2+ at achilles tendons, Babinski's downgoing.  Strength at foot  4+ out of 5 but seems to be symmetric    Impression and Recommendations:     This case required medical decision making of moderate complexity. The above documentation has been reviewed and is accurate and complete Lyndal Pulley, DO       Note: This dictation was prepared with Dragon dictation along with smaller phrase  technology. Any transcriptional errors that result from this process are unintentional.

## 2017-11-25 ENCOUNTER — Encounter: Payer: Self-pay | Admitting: Family Medicine

## 2017-11-25 ENCOUNTER — Ambulatory Visit: Payer: Medicare HMO | Admitting: Family Medicine

## 2017-11-25 ENCOUNTER — Other Ambulatory Visit (INDEPENDENT_AMBULATORY_CARE_PROVIDER_SITE_OTHER): Payer: Medicare HMO

## 2017-11-25 VITALS — BP 110/78 | HR 75 | Ht 63.0 in | Wt 147.0 lb

## 2017-11-25 DIAGNOSIS — M48062 Spinal stenosis, lumbar region with neurogenic claudication: Secondary | ICD-10-CM | POA: Diagnosis not present

## 2017-11-25 DIAGNOSIS — M2550 Pain in unspecified joint: Secondary | ICD-10-CM

## 2017-11-25 LAB — CBC WITH DIFFERENTIAL/PLATELET
BASOS ABS: 0.1 10*3/uL (ref 0.0–0.1)
Basophils Relative: 0.9 % (ref 0.0–3.0)
Eosinophils Absolute: 0.2 10*3/uL (ref 0.0–0.7)
Eosinophils Relative: 3 % (ref 0.0–5.0)
HCT: 35.8 % — ABNORMAL LOW (ref 36.0–46.0)
Hemoglobin: 11.8 g/dL — ABNORMAL LOW (ref 12.0–15.0)
LYMPHS ABS: 2.2 10*3/uL (ref 0.7–4.0)
Lymphocytes Relative: 36.2 % (ref 12.0–46.0)
MCHC: 33 g/dL (ref 30.0–36.0)
MCV: 89.8 fl (ref 78.0–100.0)
MONO ABS: 0.6 10*3/uL (ref 0.1–1.0)
Monocytes Relative: 10.2 % (ref 3.0–12.0)
NEUTROS PCT: 49.7 % (ref 43.0–77.0)
Neutro Abs: 3 10*3/uL (ref 1.4–7.7)
PLATELETS: 270 10*3/uL (ref 150.0–400.0)
RBC: 3.99 Mil/uL (ref 3.87–5.11)
RDW: 15.1 % (ref 11.5–15.5)
WBC: 6 10*3/uL (ref 4.0–10.5)

## 2017-11-25 LAB — HEMOGLOBIN A1C: HEMOGLOBIN A1C: 6.9 % — AB (ref 4.6–6.5)

## 2017-11-25 LAB — IBC PANEL
Iron: 70 ug/dL (ref 42–145)
SATURATION RATIOS: 18.2 % — AB (ref 20.0–50.0)
TRANSFERRIN: 274 mg/dL (ref 212.0–360.0)

## 2017-11-25 LAB — FERRITIN: FERRITIN: 42.9 ng/mL (ref 10.0–291.0)

## 2017-11-25 LAB — URIC ACID: URIC ACID, SERUM: 6.7 mg/dL (ref 2.4–7.0)

## 2017-11-25 LAB — SEDIMENTATION RATE: Sed Rate: 85 mm/hr — ABNORMAL HIGH (ref 0–30)

## 2017-11-25 NOTE — Patient Instructions (Signed)
Good to see you  Kristie King is your friend.  We will re-check labs Dr. Maryjean Ka office will call you as well  Make an appointment in 4 weeks to have but we will be talking.   I

## 2017-11-25 NOTE — Assessment & Plan Note (Signed)
Moderate to severe at L4-L5.  Patient has now failed all conservative therapy.  Will be referred for other intervention with the possibility may be a nerve stimulator placement.  Would like to repeat labs to further evaluate patient's uric acid, iron deficiency anemia, as well as vitamin D.  We will rule out other things with some possible cancer markers and inflammatory markers.  Patient will continue all her other medications.  All questions were answered in the entirety today.  Given workout regimen to attempt.  Spent  25 minutes with patient face-to-face and had greater than 50% of counseling including as described above in assessment and plan.

## 2017-11-28 LAB — PTH, INTACT AND CALCIUM
CALCIUM: 10 mg/dL (ref 8.6–10.4)
PTH: 85 pg/mL — AB (ref 14–64)

## 2017-11-28 LAB — VITAMIN D 1,25 DIHYDROXY
Vitamin D 1, 25 (OH)2 Total: 42 pg/mL (ref 18–72)
Vitamin D2 1, 25 (OH)2: 27 pg/mL
Vitamin D3 1, 25 (OH)2: 15 pg/mL

## 2017-11-28 LAB — PROTEIN ELECTROPHORESIS, SERUM, WITH REFLEX
ALPHA 2: 0.7 g/dL (ref 0.5–0.9)
Albumin ELP: 4.2 g/dL (ref 3.8–4.8)
Alpha 1: 0.4 g/dL — ABNORMAL HIGH (ref 0.2–0.3)
Beta 2: 0.4 g/dL (ref 0.2–0.5)
Beta Globulin: 0.4 g/dL (ref 0.4–0.6)
GAMMA GLOBULIN: 0.9 g/dL (ref 0.8–1.7)
TOTAL PROTEIN: 7 g/dL (ref 6.1–8.1)

## 2017-11-28 LAB — TEST AUTHORIZATION

## 2017-11-28 LAB — IFE INTERPRETATION: Immunofix Electr Int: NOT DETECTED

## 2017-11-28 LAB — CANCER ANTIGEN 19-9

## 2017-11-28 LAB — CALCIUM, IONIZED: CALCIUM ION: 5.17 mg/dL (ref 4.8–5.6)

## 2017-12-12 DIAGNOSIS — Z23 Encounter for immunization: Secondary | ICD-10-CM | POA: Diagnosis not present

## 2017-12-22 NOTE — Progress Notes (Signed)
[name] [address], [place] [address] Phone: [phone] Subjective:     CC: Back pain follow-up  HTD:[id number]  Kristie King is a 67 y.o. female coming in with complaint of back pain follow-up.  Past medical history significant for degenerative disc disease with spinal stenosis that was noted on MRI.  Patient has been having epidurals intermittently for about a year.  Has had unfortunately increased her gabapentin, started on allopurinol for gout, minimal changes with Cymbalta.  Activities of daily living is worsening at this time.  Patient has not been able to workout on a regular basis secondary to pain.  Feels like this is very important.  Patient was awaiting the possibility of other types of injections or radiofrequency ablation with referral but has been waiting in another month.   MRI does show spinal stenosis at L4-L5 fairly severe. Past medical history also we did a work-up laboratory wise and found the patient did have an elevated sedimentation rate of 85.  He was also noted.  ANA was negative.  No signs of any abnormal proteins and CA 19 was unremarkable.  Past Medical History:  Diagnosis Date  . Arthritis    "knees; elbows" (09/17/2012)  . Dyslipidemia 09/17/2012  . HTN (hypertension) 09/17/2012  . Type II diabetes mellitus ([facility])    Past Surgical History:  Procedure Laterality Date  . BUNIONECTOMY Right 02/18/13   [facility] Bunionectomy @ [facility]  . BUNIONECTOMY Left 04/01/2013   [facility] @[facility]    Social History   Socioeconomic History  . Marital status: Married    Spouse name: Not on file  . Number of children: Not on file  . Years of education: Not on file  . Highest education level: Not on file  Occupational History  . Not on file  Social Needs  . Financial resource strain: Not on file  . Food insecurity:    Worry: Not on file    Inability: Not on file  . Transportation needs:    Medical: Not on file   Non-medical: Not on file  Tobacco Use  . Smoking status: Former Smoker    Years: 31.00    Types: Cigarettes    Last attempt to quit: 01/14/2013    Years since quitting: 4.9  . Smokeless tobacco: Never Used  Substance and Sexual Activity  . Alcohol use: Yes    Comment: 09/17/2012 "drink on special occasions only"  . Drug use: No  . Sexual activity: Yes  Lifestyle  . Physical activity:    Days per week: Not on file    Minutes per session: Not on file  . Stress: Not on file  Relationships  . Social connections:    Talks on phone: Not on file    Gets together: Not on file    Attends religious service: Not on file    Active member of club or organization: Not on file    Attends meetings of clubs or organizations: Not on file    Relationship status: Not on file  Other Topics Concern  . Not on file  Social History Narrative  . Not on file   No Known Allergies Family History  Problem Relation Age of Onset  . Hyperlipidemia Sister   . Diabetes Brother   . Hypertension Brother   . Diabetes Sister   . Hypertension Sister     Current Outpatient Medications (Endocrine & Metabolic):  .  sitaGLIPtan-metformin (JANUMET) 50-1000 MG per tablet, TAKES Monday, Wednesday,  Friday. .  predniSONE (DELTASONE) 50 MG tablet, Take 1 tablet (50 mg total) by mouth daily.  Current Outpatient Medications (Cardiovascular):  .  amLODipine (NORVASC) 10 MG tablet, Take 5 mg by mouth. Half tab on Mon, Wed, and Fri. Marland Kitchen  atorvastatin (LIPITOR) 20 MG tablet, Take 20 mg by mouth at bedtime. .  furosemide (LASIX) 40 MG tablet, Take 20 mg by mouth daily. 20mg  on M,W,F  Current Outpatient Medications (Respiratory):  .  fluticasone (FLONASE) 50 MCG/ACT nasal spray, Place 2 sprays into both nostrils daily. .  fluticasone (VERAMYST) 27.5 MCG/SPRAY nasal spray, Place 2 sprays into both nostrils as needed.   Current Outpatient Medications (Analgesics):  .  allopurinol (ZYLOPRIM) 300 MG tablet, Take 1 tablet (300  mg total) by mouth daily. Marland Kitchen  aspirin EC 81 MG tablet, Take 81 mg by mouth every Monday, Wednesday, and Friday.    Current Outpatient Medications (Other):  .  gabapentin (NEURONTIN) 100 MG capsule, Take 2 capsules (200 mg total) by mouth at bedtime. .  Vitamin D, Ergocalciferol, (DRISDOL) 50000 units CAPS capsule, Take 1 capsule (50,000 Units total) by mouth every 7 (seven) days.    Past medical history, social, surgical and family history all reviewed in electronic medical record.  No pertanent information unless stated regarding to the chief complaint.   Review of Systems:  No headache, visual changes, nausea, vomiting, diarrhea, constipation, dizziness, abdominal pain, skin rash, fevers, chills, night sweats, weight loss, swollen lymph nodes,  chest pain, shortness of breath, mood changes.  Positive muscle aches, body aches no recent weight loss no noted  Objective  Blood pressure 112/74, pulse 79, height 5\' 3"  (1.6 m), weight 143 lb (64.9 kg), SpO2 98 %.     General: No apparent distress alert and oriented x3 mood and affect normal, dressed appropriately.  HEENT: Pupils equal, extraocular movements intact  Respiratory: Patient's speak in full sentences and does not appear short of breath  Cardiovascular: No lower extremity edema, non tender, no erythema  Skin: Warm dry intact with no signs of infection or rash on extremities or on axial skeleton.  Abdomen: Soft nontender  Neuro: Cranial nerves II through XII are intact, neurovascularly intact in all extremities with 2+ DTRs and 2+ pulses.  Lymph: No lymphadenopathy of posterior or anterior cervical chain or axillae bilaterally.  Gait normal with good balance and coordination.  MSK:  Non tender with full range of motion and good stability and symmetric strength and tone of shoulders, elbows, wrist, hip, knee and ankles bilaterally.  Patient appears to be uncomfortable. Back exam is loss of lordosis.  Decreased range of motion in all  planes.  Patient is more tender to palpation over the L4-L5 area bilaterally right greater than left.  Mild positive straight leg test on the left side.  4 out of 5 strength in lower extremities.     Impression and Recommendations:     This case required medical decision making of moderate complexity. The above documentation has been reviewed and is accurate and complete Lyndal Pulley, DO       Note: This dictation was prepared with Dragon dictation along with smaller phrase technology. Any transcriptional errors that result from this process are unintentional.

## 2017-12-23 ENCOUNTER — Ambulatory Visit (INDEPENDENT_AMBULATORY_CARE_PROVIDER_SITE_OTHER): Payer: Medicare HMO | Admitting: Family Medicine

## 2017-12-23 ENCOUNTER — Encounter: Payer: Self-pay | Admitting: Family Medicine

## 2017-12-23 VITALS — BP 112/74 | HR 79 | Ht 63.0 in | Wt 143.0 lb

## 2017-12-23 DIAGNOSIS — M48062 Spinal stenosis, lumbar region with neurogenic claudication: Secondary | ICD-10-CM | POA: Diagnosis not present

## 2017-12-23 DIAGNOSIS — M5416 Radiculopathy, lumbar region: Secondary | ICD-10-CM

## 2017-12-23 MED ORDER — KETOROLAC TROMETHAMINE 60 MG/2ML IM SOLN
60.0000 mg | Freq: Once | INTRAMUSCULAR | Status: AC
  Administered 2017-12-23: 60 mg via INTRAMUSCULAR

## 2017-12-23 MED ORDER — PREDNISONE 50 MG PO TABS: 50.0000 mg | ORAL_TABLET | Freq: Every day | ORAL | 0 refills | Status: DC

## 2017-12-23 MED ORDER — METHYLPREDNISOLONE ACETATE 80 MG/ML IJ SUSP
80.0000 mg | Freq: Once | INTRAMUSCULAR | Status: AC
  Administered 2017-12-23: 80 mg via INTRAMUSCULAR

## 2017-12-23 NOTE — Patient Instructions (Addendum)
I am sorry you are hurting so bad 2 injections today  Prednisone only if you need it. Try the colchicine 2 times a day for 3 days  We will try to get you in with neurosurgery as well   See em again in 3 weeks

## 2017-12-23 NOTE — Assessment & Plan Note (Signed)
Worsening symptoms overall.  I believe that at this time patient probably needs some surgical intervention will be sent to neurosurgery.  We discussed that patient can keep her appointment with the other interventional anesthesiologist that could be can a different treatment but I feel at this point patient's quality of life is deteriorated enough that I do feel that surgical intervention is likely necessary.  Failed all other conservative therapy.  Spent  25 minutes with patient face-to-face and had greater than 50% of counseling including as described above in assessment and plan.

## 2017-12-25 ENCOUNTER — Other Ambulatory Visit: Payer: Self-pay | Admitting: *Deleted

## 2017-12-25 DIAGNOSIS — M48061 Spinal stenosis, lumbar region without neurogenic claudication: Secondary | ICD-10-CM

## 2018-01-12 DIAGNOSIS — M545 Low back pain: Secondary | ICD-10-CM | POA: Diagnosis not present

## 2018-01-12 NOTE — Progress Notes (Signed)
[name] [address], [place] [address] Phone: [phone] Subjective:     CC: Back pain follow-up  YQM:[id number]  Kristie King is a 67 y.o. female coming in with complaint of back pain. Started PT yesterday. Exercises at home but hasn't been to the gym. Goes back to PT Thursday and plans to go 2x a week.   MRI did show that patient did have a 2 level spinal stenosis at L3-L5.,  Patient initially did respond well to 3 epidurals over the course of 12 months but worsening symptoms.      Past Medical History:  Diagnosis Date  . Arthritis    "knees; elbows" (09/17/2012)  . Dyslipidemia 09/17/2012  . HTN (hypertension) 09/17/2012  . Type II diabetes mellitus ([facility])    Past Surgical History:  Procedure Laterality Date  . BUNIONECTOMY Right 02/18/13   [facility] Bunionectomy @ [place]  . BUNIONECTOMY Left 04/01/2013   [facility]    Social History   Socioeconomic History  . Marital status: Married    Spouse name: Not on file  . Number of children: Not on file  . Years of education: Not on file  . Highest education level: Not on file  Occupational History  . Not on file  Social Needs  . Financial resource strain: Not on file  . Food insecurity:    Worry: Not on file    Inability: Not on file  . Transportation needs:    Medical: Not on file    Non-medical: Not on file  Tobacco Use  . Smoking status: Former Smoker    Years: 31.00    Types: Cigarettes    Last attempt to quit: 01/14/2013    Years since quitting: 5.0  . Smokeless tobacco: Never Used  Substance and Sexual Activity  . Alcohol use: Yes    Comment: 09/17/2012 "drink on special occasions only"  . Drug use: No  . Sexual activity: Yes  Lifestyle  . Physical activity:    Days per week: Not on file    Minutes per session: Not on file  . Stress: Not on file  Relationships  . Social connections:    Talks on phone: Not on file    Gets together: Not on file    Attends  religious service: Not on file    Active member of club or organization: Not on file    Attends meetings of clubs or organizations: Not on file    Relationship status: Not on file  Other Topics Concern  . Not on file  Social History Narrative  . Not on file   No Known Allergies Family History  Problem Relation Age of Onset  . Hyperlipidemia Sister   . Diabetes Brother   . Hypertension Brother   . Diabetes Sister   . Hypertension Sister     Current Outpatient Medications (Endocrine & Metabolic):  .  predniSONE (DELTASONE) 50 MG tablet, Take 1 tablet (50 mg total) by mouth daily. .  sitaGLIPtan-metformin (JANUMET) 50-1000 MG per tablet, TAKES Monday, Wednesday, Friday.  Current Outpatient Medications (Cardiovascular):  .  amLODipine (NORVASC) 10 MG tablet, Take 5 mg by mouth. Half tab on Mon, Wed, and Fri. Marland Kitchen  atorvastatin (LIPITOR) 20 MG tablet, Take 20 mg by mouth at bedtime. .  furosemide (LASIX) 40 MG tablet, Take 20 mg by mouth daily. 20mg  on M,W,F  Current Outpatient Medications (Respiratory):  .  fluticasone (FLONASE) 50 MCG/ACT nasal spray, Place 2 sprays into  both nostrils daily. .  fluticasone (VERAMYST) 27.5 MCG/SPRAY nasal spray, Place 2 sprays into both nostrils as needed.   Current Outpatient Medications (Analgesics):  .  allopurinol (ZYLOPRIM) 300 MG tablet, Take 1 tablet (300 mg total) by mouth daily. Marland Kitchen  aspirin EC 81 MG tablet, Take 81 mg by mouth every Monday, Wednesday, and Friday.    Current Outpatient Medications (Other):  .  gabapentin (NEURONTIN) 100 MG capsule, Take 2 capsules (200 mg total) by mouth at bedtime. .  Vitamin D, Ergocalciferol, (DRISDOL) 50000 units CAPS capsule, Take 1 capsule (50,000 Units total) by mouth every 7 (seven) days.    Past medical history, social, surgical and family history all reviewed in electronic medical record.  No pertanent information unless stated regarding to the chief complaint.   Review of Systems:  No  headache, visual changes, nausea, vomiting, diarrhea, constipation, dizziness, abdominal pain, skin rash, fevers, chills, night sweats, weight loss, swollen lymph nodes, body aches, joint swelling, chest pain, shortness of breath, mood changes.  Positive muscle aches  Objective  Blood pressure 120/80, pulse 76, height 5\' 3"  (1.6 m), weight 148 lb (67.1 kg), SpO2 99 %. S    General: No apparent distress alert and oriented x3 mood and affect normal, dressed appropriately.  Patient does appear somewhat ill looking HEENT: Pupils equal, extraocular movements intact  Respiratory: Patient's speak in full sentences and does not appear short of breath  Cardiovascular: No lower extremity edema, non tender, no erythema  Skin: Warm dry intact with no signs of infection or rash on extremities or on axial skeleton.  Abdomen: Soft tender diffusely which is a new symptom Neuro: Cranial nerves II through XII are intact, neurovascularly intact in all extremities with 2+ DTRs and 2+ pulses.  Lymph: No lymphadenopathy of posterior or anterior cervical chain or axillae bilaterally.  Gait antalgic gait MSK:  Non tender with full range of motion and good stability and symmetric strength and tone of shoulders, elbows, wrist, hip, knee and ankles bilaterally.  Mild arthritic changes Back exam shows loss of lordosis.  Diffuse tenderness to palpation diffusely in the lower back.  Patient has severe tightness.  Difficult to even do Faber test.  Neurovascularly intact distally. 4+ out of 5 strength of the dorsiflexion and plantarflexion   Impression and Recommendations:     This case required medical decision making of moderate complexity. The above documentation has been reviewed and is accurate and complete Lyndal Pulley, DO       Note: This dictation was prepared with Dragon dictation along with smaller phrase technology. Any transcriptional errors that result from this process are unintentional.

## 2018-01-13 ENCOUNTER — Ambulatory Visit: Payer: Medicare HMO | Admitting: Family Medicine

## 2018-01-13 ENCOUNTER — Encounter: Payer: Self-pay | Admitting: Family Medicine

## 2018-01-13 VITALS — BP 120/80 | HR 76 | Ht 63.0 in | Wt 148.0 lb

## 2018-01-13 DIAGNOSIS — G8929 Other chronic pain: Secondary | ICD-10-CM | POA: Diagnosis not present

## 2018-01-13 DIAGNOSIS — M545 Low back pain, unspecified: Secondary | ICD-10-CM

## 2018-01-13 DIAGNOSIS — R700 Elevated erythrocyte sedimentation rate: Secondary | ICD-10-CM

## 2018-01-13 DIAGNOSIS — M48062 Spinal stenosis, lumbar region with neurogenic claudication: Secondary | ICD-10-CM | POA: Diagnosis not present

## 2018-01-13 MED ORDER — KETOROLAC TROMETHAMINE 60 MG/2ML IM SOLN
60.0000 mg | Freq: Once | INTRAMUSCULAR | Status: AC
  Administered 2018-01-13: 60 mg via INTRAMUSCULAR

## 2018-01-13 MED ORDER — METHYLPREDNISOLONE ACETATE 80 MG/ML IJ SUSP
80.0000 mg | Freq: Once | INTRAMUSCULAR | Status: AC
  Administered 2018-01-13: 80 mg via INTRAMUSCULAR

## 2018-01-13 NOTE — Assessment & Plan Note (Signed)
Elevated.  Iron deficiency no.  But I do feel that further work-up noted.

## 2018-01-13 NOTE — Assessment & Plan Note (Signed)
Patient does have spinal stenosis.  To level.  Failed all conservative therapy.  Awaiting referral at this point for neurosurgery and seeing them in 2 weeks.  See if any surgical intervention would be beneficial.  In the interim with patient having an elevated sedimentation rate and new onset of some abdominal pain and recent diagnosis of diabetes with an elevated A1c I would like to get a CT abdomen pelvis to make sure there is no other modality that is contributing.  Patient otherwise will follow-up with me after neurosurgery if she has questions otherwise I encouraged her she will be in great hands with the neurosurgeon.  Spent  25 minutes with patient face-to-face and had greater than 50% of counseling including as described above in assessment and plan.

## 2018-01-13 NOTE — Patient Instructions (Signed)
I am sorry you are hurting 2 injections today  Ice is your friend We will get CT scan of abdomen and pelvis.  Send me a message after meeting with neurosurgery. And see what they say

## 2018-01-14 ENCOUNTER — Other Ambulatory Visit: Payer: Self-pay | Admitting: *Deleted

## 2018-01-14 DIAGNOSIS — I10 Essential (primary) hypertension: Secondary | ICD-10-CM

## 2018-01-15 DIAGNOSIS — M545 Low back pain: Secondary | ICD-10-CM | POA: Diagnosis not present

## 2018-01-19 DIAGNOSIS — M545 Low back pain: Secondary | ICD-10-CM | POA: Diagnosis not present

## 2018-01-23 DIAGNOSIS — M545 Low back pain: Secondary | ICD-10-CM | POA: Diagnosis not present

## 2018-01-23 DIAGNOSIS — Z1231 Encounter for screening mammogram for malignant neoplasm of breast: Secondary | ICD-10-CM | POA: Diagnosis not present

## 2018-01-28 DIAGNOSIS — M5416 Radiculopathy, lumbar region: Secondary | ICD-10-CM | POA: Diagnosis not present

## 2018-01-28 DIAGNOSIS — M5126 Other intervertebral disc displacement, lumbar region: Secondary | ICD-10-CM | POA: Diagnosis not present

## 2018-02-02 ENCOUNTER — Other Ambulatory Visit (INDEPENDENT_AMBULATORY_CARE_PROVIDER_SITE_OTHER): Payer: Medicare HMO

## 2018-02-02 DIAGNOSIS — I10 Essential (primary) hypertension: Secondary | ICD-10-CM | POA: Diagnosis not present

## 2018-02-02 LAB — BASIC METABOLIC PANEL
BUN: 31 mg/dL — AB (ref 6–23)
CO2: 29 mEq/L (ref 19–32)
CREATININE: 0.91 mg/dL (ref 0.40–1.20)
Calcium: 9.9 mg/dL (ref 8.4–10.5)
Chloride: 102 mEq/L (ref 96–112)
GFR: 79.29 mL/min (ref >60.00)
Glucose, Bld: 144 mg/dL — ABNORMAL HIGH (ref 70–99)
POTASSIUM: 3.7 meq/L (ref 3.5–5.1)
Sodium: 139 mEq/L (ref 135–145)

## 2018-02-03 ENCOUNTER — Inpatient Hospital Stay: Admission: RE | Admit: 2018-02-03 | Payer: Self-pay | Source: Ambulatory Visit

## 2018-02-03 DIAGNOSIS — M545 Low back pain: Secondary | ICD-10-CM | POA: Diagnosis not present

## 2018-02-05 DIAGNOSIS — M5126 Other intervertebral disc displacement, lumbar region: Secondary | ICD-10-CM | POA: Diagnosis not present

## 2018-02-05 LAB — HM MAMMOGRAPHY

## 2018-02-10 DIAGNOSIS — M545 Low back pain: Secondary | ICD-10-CM | POA: Diagnosis not present

## 2018-02-16 DIAGNOSIS — E119 Type 2 diabetes mellitus without complications: Secondary | ICD-10-CM | POA: Diagnosis not present

## 2018-02-16 DIAGNOSIS — R69 Illness, unspecified: Secondary | ICD-10-CM | POA: Diagnosis not present

## 2018-02-16 DIAGNOSIS — I7090 Unspecified atherosclerosis: Secondary | ICD-10-CM | POA: Diagnosis not present

## 2018-02-16 DIAGNOSIS — H35033 Hypertensive retinopathy, bilateral: Secondary | ICD-10-CM | POA: Diagnosis not present

## 2018-02-16 DIAGNOSIS — H35362 Drusen (degenerative) of macula, left eye: Secondary | ICD-10-CM | POA: Diagnosis not present

## 2018-02-16 LAB — HM DIABETES EYE EXAM

## 2018-02-17 ENCOUNTER — Ambulatory Visit (INDEPENDENT_AMBULATORY_CARE_PROVIDER_SITE_OTHER)
Admission: RE | Admit: 2018-02-17 | Discharge: 2018-02-17 | Disposition: A | Discharge status: Home / Self Care | Payer: Medicare HMO | Source: Ambulatory Visit | Attending: Family Medicine | Admitting: Family Medicine

## 2018-02-17 DIAGNOSIS — G8929 Other chronic pain: Secondary | ICD-10-CM | POA: Diagnosis not present

## 2018-02-17 DIAGNOSIS — R140 Abdominal distension (gaseous): Secondary | ICD-10-CM | POA: Diagnosis not present

## 2018-02-17 DIAGNOSIS — M545 Low back pain, unspecified: Secondary | ICD-10-CM

## 2018-02-17 MED ORDER — IOPAMIDOL (ISOVUE-300) INJECTION 61%
100.0000 mL | Freq: Once | INTRAVENOUS | Status: AC | PRN
  Administered 2018-02-17: 100 mL via INTRAVENOUS

## 2018-02-19 DIAGNOSIS — D860 Sarcoidosis of lung: Secondary | ICD-10-CM | POA: Diagnosis not present

## 2018-02-19 DIAGNOSIS — E7800 Pure hypercholesterolemia, unspecified: Secondary | ICD-10-CM | POA: Diagnosis not present

## 2018-02-19 DIAGNOSIS — E1165 Type 2 diabetes mellitus with hyperglycemia: Secondary | ICD-10-CM | POA: Diagnosis not present

## 2018-02-19 DIAGNOSIS — J209 Acute bronchitis, unspecified: Secondary | ICD-10-CM | POA: Diagnosis not present

## 2018-02-19 DIAGNOSIS — N182 Chronic kidney disease, stage 2 (mild): Secondary | ICD-10-CM | POA: Diagnosis not present

## 2018-02-19 DIAGNOSIS — Z79899 Other long term (current) drug therapy: Secondary | ICD-10-CM | POA: Diagnosis not present

## 2018-02-19 DIAGNOSIS — M109 Gout, unspecified: Secondary | ICD-10-CM | POA: Diagnosis not present

## 2018-02-19 DIAGNOSIS — Z6826 Body mass index (BMI) 26.0-26.9, adult: Secondary | ICD-10-CM | POA: Diagnosis not present

## 2018-02-19 DIAGNOSIS — I119 Hypertensive heart disease without heart failure: Secondary | ICD-10-CM | POA: Diagnosis not present

## 2018-02-19 DIAGNOSIS — M2550 Pain in unspecified joint: Secondary | ICD-10-CM | POA: Diagnosis not present

## 2018-02-27 ENCOUNTER — Encounter (HOSPITAL_COMMUNITY): Payer: Self-pay

## 2018-02-27 ENCOUNTER — Emergency Department (HOSPITAL_COMMUNITY)
Admission: EM | Admit: 2018-02-27 | Discharge: 2018-02-27 | Disposition: A | Discharge status: Home / Self Care | Payer: Medicare HMO | Attending: Emergency Medicine | Admitting: Emergency Medicine

## 2018-02-27 DIAGNOSIS — Z7984 Long term (current) use of oral hypoglycemic drugs: Secondary | ICD-10-CM | POA: Diagnosis not present

## 2018-02-27 DIAGNOSIS — Z87891 Personal history of nicotine dependence: Secondary | ICD-10-CM | POA: Insufficient documentation

## 2018-02-27 DIAGNOSIS — I10 Essential (primary) hypertension: Secondary | ICD-10-CM | POA: Diagnosis not present

## 2018-02-27 DIAGNOSIS — E119 Type 2 diabetes mellitus without complications: Secondary | ICD-10-CM | POA: Diagnosis not present

## 2018-02-27 DIAGNOSIS — Z79899 Other long term (current) drug therapy: Secondary | ICD-10-CM | POA: Insufficient documentation

## 2018-02-27 DIAGNOSIS — R040 Epistaxis: Secondary | ICD-10-CM | POA: Insufficient documentation

## 2018-02-27 MED ORDER — TRANEXAMIC ACID 1000 MG/10ML IV SOLN
500.0000 mg | Freq: Once | INTRAVENOUS | Status: AC
  Administered 2018-02-27: 500 mg via TOPICAL
  Filled 2018-02-27: qty 10

## 2018-02-27 MED ORDER — OXYMETAZOLINE HCL 0.05 % NA SOLN
1.0000 | Freq: Once | NASAL | Status: AC
  Administered 2018-02-27: 1 via NASAL
  Filled 2018-02-27: qty 15

## 2018-02-27 NOTE — ED Provider Notes (Signed)
[facility] DEPT Provider Note   CSN: [id number] Arrival date & time: 02/27/18  0306     History   Chief Complaint Chief Complaint  Patient presents with  . Epistaxis    HPI Kristie King is a 67 y.o. female.  The history is provided by the patient and medical records. No language interpreter was used.   Kristie King is a 67 y.o. female  with a PMH of HTN, HLD, DM who presents to the Emergency Department complaining of epistaxis which began around 1 AM this morning.  Nosebleed lasted about an hour.  She was able to get it to resolve by applying direct pressure.  She was about to go lay down when nose began bleeding again.  She reports multiple clots.  She then spit and had blood in her spit as well.  She tried to apply pressure, but this did not seem to help, but prompting her to come to the emergency department.  She states that since arriving to the emergency department, her epistaxis has been resolving, but then returning.  She has had 2 nosebleeds while waiting in the emergency department, both of which have resolved, however shortly before I walked into the room, her nose began bleeding again.  She reports that is coming from both nares.  She has recently been dealing with cough, congestion.  She was seen by her PCP who started her on prednisone and Flonase.  She has been blowing her nose frequently and struggling with nasal congestion for the last week or so.   Past Medical History:  Diagnosis Date  . Arthritis    "knees; elbows" (09/17/2012)  . Dyslipidemia 09/17/2012  . HTN (hypertension) 09/17/2012  . Type II diabetes mellitus [facility])     Patient Active Problem List   Diagnosis Date Noted  . Elevated sedimentation rate 01/13/2018  . Nonallopathic lesion of lumbosacral region 08/12/2017  . Nonallopathic lesion of sacral region 08/12/2017  . Nonallopathic lesion of thoracic region 08/12/2017  . Lumbar spinal stenosis 01/21/2017  . AC  (acromioclavicular) arthritis 11/13/2016  . Iron deficiency anemia 10/16/2016  . Vitamin D deficiency 10/16/2016  . Muscle cramp 10/01/2016  . Cervical radiculopathy at C6 09/10/2016  . Osteoarthritis of spine with radiculopathy, cervical region 08/03/2016  . Low back pain 07/12/2016  . Rotator cuff tear 04/12/2014  . Loss of transverse plantar arch of left foot 11/16/2013  . Abnormality of gait 11/16/2013  . Synovitis of knee 09/01/2013  . Status post foot surgery 02/23/2013  . Bunion 02/10/2013  . Pain, foot 02/10/2013  . ARF (acute renal failure) ([place]) 09/17/2012  . General weakness 09/17/2012  . Dyslipidemia 09/17/2012  . HTN (hypertension) 09/17/2012  . Diabetes mellitus without complication [facility])     Past Surgical History:  Procedure Laterality Date  . BUNIONECTOMY Right 02/18/13   [place] Bunionectomy @ [place]  . BUNIONECTOMY Left 04/01/2013   [place] [facility]      OB History   No obstetric history on file.      Home Medications    Prior to Admission medications   Medication Sig Start Date End Date Taking? Authorizing Provider  allopurinol (ZYLOPRIM) 300 MG tablet Take 1 tablet (300 mg total) by mouth daily. 11/04/17  Yes Lyndal Pulley, DO  amLODipine (NORVASC) 10 MG tablet Take 5 mg by mouth. Half tab on Mon, Wed, and Fri. 09/18/12  Yes Thurnell Lose, MD  furosemide (LASIX) 40 MG tablet Take 20 mg by mouth daily.  20mg  on M,W,F 11/17/12  Yes [provider]  sitaGLIPtan-metformin (JANUMET) 50-1000 MG per tablet TAKES Monday, Wednesday, Friday.   Yes [provider]  atorvastatin (LIPITOR) 20 MG tablet Take 20 mg by mouth at bedtime.    [provider]  fluticasone (FLONASE) 50 MCG/ACT nasal spray Place 2 sprays into both nostrils daily. Patient not taking: Reported on 02/27/2018 05/29/17   Lyndal Pulley, DO  gabapentin (NEURONTIN) 100 MG capsule Take 2 capsules (200 mg total) by mouth at bedtime. Patient not taking: Reported on  02/27/2018 11/19/16   Lyndal Pulley, DO  predniSONE (DELTASONE) 50 MG tablet Take 1 tablet (50 mg total) by mouth daily. Patient not taking: Reported on 02/27/2018 12/23/17   Lyndal Pulley, DO  Vitamin D, Ergocalciferol, (DRISDOL) 50000 units CAPS capsule Take 1 capsule (50,000 Units total) by mouth every 7 (seven) days. Patient not taking: Reported on 02/27/2018 11/04/17   Lyndal Pulley, DO    Family History Family History  Problem Relation Age of Onset  . Hyperlipidemia Sister   . Diabetes Brother   . Hypertension Brother   . Diabetes Sister   . Hypertension Sister     Social History Social History   Tobacco Use  . Smoking status: Former Smoker    Years: 31.00    Types: Cigarettes    Last attempt to quit: 01/14/2013    Years since quitting: 5.1  . Smokeless tobacco: Never Used  Substance Use Topics  . Alcohol use: Yes    Comment: 09/17/2012 "drink on special occasions only"  . Drug use: No     Allergies   Patient has no known allergies.   Review of Systems Review of Systems  HENT: Positive for congestion, nosebleeds and sinus pressure. Negative for sore throat and trouble swallowing.   Respiratory: Positive for cough. Negative for shortness of breath and wheezing.   Cardiovascular: Negative for chest pain.  All other systems reviewed and are negative.    Physical Exam Updated Vital Signs BP (!) 146/83   Pulse 86   Temp 98.1 F (36.7 C) (Oral)   Resp 16   SpO2 99%   Physical Exam Vitals signs and nursing note reviewed.  Constitutional:      General: She is not in acute distress.    Appearance: She is well-developed.  HENT:     Head: Normocephalic and atraumatic.     Nose:     Comments: Clot to left nare.  Right nare with small amount of active bleeding.    Mouth/Throat:     Comments: OP clear. No blood. Cardiovascular:     Rate and Rhythm: Normal rate and regular rhythm.     Heart sounds: Normal heart sounds. No murmur.  Pulmonary:      Effort: Pulmonary effort is normal. No respiratory distress.     Breath sounds: Normal breath sounds.     Comments: Lungs clear to auscultation bilaterally. Abdominal:     General: There is no distension.     Palpations: Abdomen is soft.     Tenderness: There is no abdominal tenderness.  Skin:    General: Skin is warm and dry.  Neurological:     Mental Status: She is alert and oriented to person, place, and time.      ED Treatments / Results  Labs (all labs ordered are listed, but only abnormal results are displayed) Labs Reviewed - No data to display  EKG None  Radiology No results found.  Procedures  Procedures (including critical care time)  Medications Ordered in ED Medications  tranexamic acid (CYKLOKAPRON) injection 500 mg (500 mg Topical Given 02/27/18 0724)  oxymetazoline (AFRIN) 0.05 % nasal spray 1 spray (1 spray Each Nare Given 02/27/18 0724)     Initial Impression / Assessment and Plan / ED Course  I have reviewed the triage vital signs and the nursing notes.  Pertinent labs & imaging results that were available during my care of the patient were reviewed by me and considered in my medical decision making (see chart for details).    Kristie King is a 67 y.o. female who presents to ED for epistaxis which began this morning. OP clear. Bilateral anterior nares with signs of bleeding. She has had URI symptoms with frequent nose-blowing which is likely contributing to nosebleed today. Not on anti-coagulation. Afrin given. TXA soaked packing placed in ED. Patient re-evaluated and packing removed. No signs of bleeding. Discussed home care instructions. If nosebleed returns and is not able to resolve with Afrin/pressure, she will return to ER and likely need packing. All questions answered.   Patient discussed with Dr. Wyvonnia Dusky who agrees with treatment plan.    Final Clinical Impressions(s) / ED Diagnoses   Final diagnoses:  Epistaxis    ED Discharge Orders     None       Ward, Ozella Almond, PA-C 02/27/18 [id number]    Ezequiel Essex, MD 02/27/18 1013

## 2018-02-27 NOTE — Discharge Instructions (Signed)
It was my pleasure taking care of you today!   There are several techniques you can use to prevent or stop nosebleeds in the future. Keep your nose moist either with saline spray several times a day or by applying a thin layer of Neosporin, bacitracin or other antibiotic ointment to the inside of your nose once or twice a day.  If the bleeding starts again, gently blow your nose into a tissue to clear the blood and clots, then apply 1-2 sprays to each affected nostril of your Afrin nasal spray (oxymetazoline). Then squeeze your nose shut tightly and DO NOT PEEK for at least 15 minutes. This will resolve most nosebleeds.  If you continue to have trouble after trying these techniques, or anything seems out of the ordinary or concerns you, please return to the Emergency Department.

## 2018-02-27 NOTE — ED Triage Notes (Signed)
Pt complains of a nosebleed that lasted about one hour, she was able to get it to stop, she states that there were clots

## 2018-03-02 DIAGNOSIS — R69 Illness, unspecified: Secondary | ICD-10-CM | POA: Diagnosis not present

## 2018-03-03 DIAGNOSIS — M545 Low back pain: Secondary | ICD-10-CM | POA: Diagnosis not present

## 2018-03-09 ENCOUNTER — Other Ambulatory Visit (HOSPITAL_COMMUNITY): Payer: Self-pay | Admitting: Anesthesiology

## 2018-03-09 ENCOUNTER — Other Ambulatory Visit: Payer: Self-pay | Admitting: Anesthesiology

## 2018-03-09 DIAGNOSIS — M5126 Other intervertebral disc displacement, lumbar region: Secondary | ICD-10-CM

## 2018-03-12 DIAGNOSIS — M545 Low back pain: Secondary | ICD-10-CM | POA: Diagnosis not present

## 2018-03-13 ENCOUNTER — Ambulatory Visit (HOSPITAL_COMMUNITY)
Admission: RE | Admit: 2018-03-13 | Discharge: 2018-03-13 | Disposition: A | Discharge status: Home / Self Care | Payer: Medicare HMO | Source: Ambulatory Visit | Attending: Anesthesiology | Admitting: Anesthesiology

## 2018-03-13 DIAGNOSIS — M5126 Other intervertebral disc displacement, lumbar region: Secondary | ICD-10-CM

## 2018-03-13 DIAGNOSIS — M545 Low back pain: Secondary | ICD-10-CM | POA: Diagnosis not present

## 2018-03-13 DIAGNOSIS — M48062 Spinal stenosis, lumbar region with neurogenic claudication: Secondary | ICD-10-CM | POA: Diagnosis not present

## 2018-03-17 DIAGNOSIS — M545 Low back pain: Secondary | ICD-10-CM | POA: Diagnosis not present

## 2018-03-18 DIAGNOSIS — M48062 Spinal stenosis, lumbar region with neurogenic claudication: Secondary | ICD-10-CM | POA: Diagnosis not present

## 2018-03-18 DIAGNOSIS — I10 Essential (primary) hypertension: Secondary | ICD-10-CM | POA: Diagnosis not present

## 2018-03-19 ENCOUNTER — Telehealth: Payer: Self-pay | Admitting: *Deleted

## 2018-03-19 MED ORDER — INDOMETHACIN 50 MG PO CAPS: 50.0000 mg | ORAL_CAPSULE | Freq: Two times a day (BID) | ORAL | 0 refills | Status: DC

## 2018-03-19 NOTE — Telephone Encounter (Signed)
Pt called stating she was having a gout flare. Pt is currently taking allopurinol but feels that it is no longer working & is requesting Dr. Tamala Julian send a prescription in for indomethacin. Per Dr. Tamala Julian okay to send in indomethacin 50mg  1 cap BID #10.  Pt made aware.

## 2018-03-26 DIAGNOSIS — M5126 Other intervertebral disc displacement, lumbar region: Secondary | ICD-10-CM | POA: Diagnosis not present

## 2018-03-30 DIAGNOSIS — M48062 Spinal stenosis, lumbar region with neurogenic claudication: Secondary | ICD-10-CM | POA: Diagnosis not present

## 2018-03-30 HISTORY — PX: BACK SURGERY: SHX140

## 2018-04-12 NOTE — Progress Notes (Signed)
SOCORRO KANITZ is a 68 y.o. female is here to [place].   Patient Care Team: Briscoe Deutscher, DO as PCP - General (Family Medicine) Lyndal Pulley, DO as Consulting Physician (Family Medicine) Vincente Liberty, MD as Consulting Physician (Pulmonary Disease) Monna Fam, MD as Consulting Physician (Ophthalmology) [facility] as Consulting Physician Zada Finders Joyice Faster, MD as Consulting Physician (Neurosurgery) Clydell Hakim, MD as Consulting Physician (Anesthesiology)   History of Present Illness:   Lonell Grandchild, CMA acting as scribe for Dr. Briscoe Deutscher.   OAC:ZYSAYTK in office to establish care. She was seeing Dr. Katherine Roan. He has cut his hours back and is only working part time. See Assessment and Plan section for Problem Based Charting of issues discussed today.   Health Maintenance Due  Topic Date Due  . Hepatitis C Screening  October 11, 1950  . FOOT EXAM  01/14/1961  . OPHTHALMOLOGY EXAM  01/14/1961  . URINE MICROALBUMIN  01/14/1961  . TETANUS/TDAP  01/14/1970  . MAMMOGRAM  01/14/2001  . COLONOSCOPY  01/14/2001  . DEXA SCAN  01/15/2016  . PNA vac Low Risk Adult (1 of 2 - PCV13) 01/15/2016   No flowsheet data found.  PMHx, SurgHx, SocialHx, Medications, and Allergies were reviewed in the Visit Navigator and updated as appropriate.   Past Medical History:  Diagnosis Date  . AC (acromioclavicular) arthritis 11/13/2016  . Cervical radiculopathy at C6 09/10/2016  . CKD stage 2 due to type 2 diabetes mellitus ([place]) 04/14/2018  . Diabetes mellitus without complication ([place])   . HTN (hypertension) 09/17/2012  . Hyperlipidemia associated with type 2 diabetes mellitus ([place]) 09/17/2012  . Hypertension associated with diabetes ([place]) 04/14/2018  . Iron deficiency anemia 10/16/2016  . Osteoarthritis of spine with radiculopathy, cervical region 08/03/2016  . Vitamin D deficiency 10/16/2016     Past Surgical History:  Procedure Laterality Date  . BACK  SURGERY  03/30/2018  . BUNIONECTOMY Right 02/18/2013  . BUNIONECTOMY Left 04/01/2013     Family History  Problem Relation Age of Onset  . Hyperlipidemia Sister   . Diabetes Brother   . Hypertension Brother   . Diabetes Sister   . Hypertension Sister     Social History   Tobacco Use  . Smoking status: Former Smoker    Years: 31.00    Types: Cigarettes    Last attempt to quit: 01/14/2013    Years since quitting: 5.2  . Smokeless tobacco: Never Used  Substance Use Topics  . Alcohol use: Yes    Comment: 09/17/2012 "drink on special occasions only"  . Drug use: No    Current Medications and Allergies   .  allopurinol (ZYLOPRIM) 300 MG tablet, Take 1 tablet (300 mg total) by mouth daily., Disp: 30 tablet, Rfl: 6 .  amLODipine (NORVASC) 10 MG tablet, Take 5 mg by mouth. Half tab on Mon, Wed, and Fri., Disp: , Rfl:  .  atorvastatin (LIPITOR) 20 MG tablet, Take 20 mg by mouth at bedtime., Disp: , Rfl:  .  fluticasone (FLONASE) 50 MCG/ACT nasal spray, Place 2 sprays into both nostrils daily., Disp: 16 g, Rfl: 6 .  furosemide (LASIX) 40 MG tablet, Take 20 mg by mouth daily. 20mg  on M,W,F, Disp: , Rfl:  .  meloxicam (MOBIC) 15 MG tablet, , Disp: , Rfl:  .  sitaGLIPtan-metformin (JANUMET) 50-1000 MG per tablet, TAKES Monday, Wednesday, Friday., Disp: , Rfl:  .  cyclobenzaprine (FLEXERIL) 10 MG tablet, TAKE 1 TABLET BY MOUTH 3 TIMES EVERY DAY AS NEEDED  FOR MUSCLE SPASMS, Disp: , Rfl:  .  metFORMIN (GLUCOPHAGE) 1000 MG tablet, Mon, wed fri, Disp: , Rfl:  .  oxyCODONE (OXY IR/ROXICODONE) 5 MG immediate release tablet, , Disp: , Rfl:   No Known Allergies   Review of Systems   Pertinent items are noted in the HPI. Otherwise, a complete ROS is negative.  Vitals   Vitals:   04/13/18 1032  BP: 126/82  Pulse: 87  Temp: 98.2 F (36.8 C)  TempSrc: Oral  SpO2: 97%  Weight: 151 lb 3.2 oz (68.6 kg)  Height: 5\' 3"  (1.6 m)     Body mass index is 26.78 kg/m.  Physical Exam    Physical Exam Vitals signs and nursing note reviewed.  Constitutional:      General: She is not in acute distress.    Appearance: She is normal weight.  HENT:     Head: Normocephalic and atraumatic.     Right Ear: Tympanic membrane, ear canal and external ear normal.     Left Ear: Tympanic membrane, ear canal and external ear normal.     Nose: Nose normal.     Mouth/Throat:     Mouth: Mucous membranes are moist.  Eyes:     Pupils: Pupils are equal, round, and reactive to light.  Neck:     Musculoskeletal: Normal range of motion and neck supple.  Cardiovascular:     Rate and Rhythm: Normal rate and regular rhythm.     Heart sounds: Normal heart sounds.  Pulmonary:     Effort: Pulmonary effort is normal.  Abdominal:     Palpations: Abdomen is soft.  Musculoskeletal:     Right lower leg: No edema.     Left lower leg: No edema.  Skin:    General: Skin is warm.     Capillary Refill: Capillary refill takes less than 2 seconds.     Comments: Healing incision lumbar.  Neurological:     General: No focal deficit present.     Gait: Gait abnormal.  Psychiatric:        Behavior: Behavior normal.    Results for orders placed or performed in visit on 04/13/18  CBC with Differential/Platelet  Result Value Ref Range   WBC 8.3 4.0 - 10.5 K/uL   RBC 3.40 (L) 3.87 - 5.11 Mil/uL   Hemoglobin 10.3 (L) 12.0 - 15.0 g/dL   HCT 31.3 (L) 36.0 - 46.0 %   MCV 92.1 78.0 - 100.0 fl   MCHC 32.9 30.0 - 36.0 g/dL   RDW 13.6 11.5 - 15.5 %   Platelets 470.0 (H) 150.0 - 400.0 K/uL   Neutrophils Relative % 57.3 43.0 - 77.0 %   Lymphocytes Relative 27.6 12.0 - 46.0 %   Monocytes Relative 11.5 3.0 - 12.0 %   Eosinophils Relative 2.7 0.0 - 5.0 %   Basophils Relative 0.9 0.0 - 3.0 %   Neutro Abs 4.7 1.4 - 7.7 K/uL   Lymphs Abs 2.3 0.7 - 4.0 K/uL   Monocytes Absolute 1.0 0.1 - 1.0 K/uL   Eosinophils Absolute 0.2 0.0 - 0.7 K/uL   Basophils Absolute 0.1 0.0 - 0.1 K/uL  Comprehensive metabolic panel   Result Value Ref Range   Sodium 138 135 - 145 mEq/L   Potassium 4.1 3.5 - 5.1 mEq/L   Chloride 100 96 - 112 mEq/L   CO2 32 19 - 32 mEq/L   Glucose, Bld 91 70 - 99 mg/dL   BUN 23 6 - 23 mg/dL  Creatinine, Ser 0.76 0.40 - 1.20 mg/dL   Total Bilirubin 0.3 0.2 - 1.2 mg/dL   Alkaline Phosphatase 47 39 - 117 U/L   AST 14 0 - 37 U/L   ALT 4 0 - 35 U/L   Total Protein 6.2 6.0 - 8.3 g/dL   Albumin 3.8 3.5 - 5.2 g/dL   Calcium 9.6 8.4 - 10.5 mg/dL   GFR 91.78 >60.00 mL/min  Lipid panel  Result Value Ref Range   Cholesterol 235 (H) 0 - 200 mg/dL   Triglycerides 62.0 0.0 - 149.0 mg/dL   HDL 108.80 >39.00 mg/dL   VLDL 12.4 0.0 - 40.0 mg/dL   LDL Cholesterol 114 (H) 0 - 99 mg/dL   Total CHOL/HDL Ratio 2    NonHDL 126.24   TSH  Result Value Ref Range   TSH 0.86 0.35 - 4.50 uIU/mL  Hemoglobin A1c  Result Value Ref Range   Hgb A1c MFr Bld 6.6 (H) 4.6 - 6.5 %  Uric acid  Result Value Ref Range   Uric Acid, Serum 4.9 2.4 - 7.0 mg/dL   Assessment and Plan   Renee was seen today for establish care.  Diagnoses and all orders for this visit:  Hypertension associated with diabetes ([employer])  Diabetes mellitus without complication ([place]) -     CBC with Differential/Platelet -     Comprehensive metabolic panel -     Hemoglobin A1c -     Dulaglutide (TRULICITY) 5.49 IY/6.4BR SOPN; 0.75 mg [place] q wk x 2 wks , then increase to 1.5 mg [place] if tolerating -     Dulaglutide (TRULICITY) 8.30 NM/0.7WK SOPN; 0.75 mg [place] q wk x 2 wks , then increase to 1.5 mg  if tolerating  Arthralgia, unspecified joint  Hyperlipidemia associated with type 2 diabetes mellitus (HCC) -     Lipid panel  Sarcoidosis, with skin manifestations  Allergic rhinitis, unspecified seasonality, unspecified trigger  Chronic gout without tophus, unspecified cause, unspecified site -     Uric acid  CKD stage 2 due to type 2 diabetes mellitus (HCC)  Other iron deficiency anemia  Hx of decompressive lumbar laminectomy  Weight  gain -     TSH    . Orders and follow up as documented in [place], reviewed diet, exercise and weight control, cardiovascular risk and specific lipid/LDL goals reviewed, reviewed medications and side effects in detail.  . Reviewed expectations re: course of current medical issues. . Outlined signs and symptoms indicating need for more acute intervention. . Patient verbalized understanding and all questions were answered. . Patient received an After Visit Summary.  Briscoe Deutscher, DO [place], [place] [facility] 04/17/2018

## 2018-04-13 ENCOUNTER — Encounter: Payer: Self-pay | Admitting: Family Medicine

## 2018-04-13 ENCOUNTER — Ambulatory Visit (INDEPENDENT_AMBULATORY_CARE_PROVIDER_SITE_OTHER): Payer: Medicare HMO | Admitting: Family Medicine

## 2018-04-13 VITALS — BP 126/82 | HR 87 | Temp 98.2°F | Ht 63.0 in | Wt 151.2 lb

## 2018-04-13 DIAGNOSIS — I152 Hypertension secondary to endocrine disorders: Secondary | ICD-10-CM

## 2018-04-13 DIAGNOSIS — R635 Abnormal weight gain: Secondary | ICD-10-CM | POA: Diagnosis not present

## 2018-04-13 DIAGNOSIS — E1122 Type 2 diabetes mellitus with diabetic chronic kidney disease: Secondary | ICD-10-CM | POA: Diagnosis not present

## 2018-04-13 DIAGNOSIS — N182 Chronic kidney disease, stage 2 (mild): Secondary | ICD-10-CM

## 2018-04-13 DIAGNOSIS — J309 Allergic rhinitis, unspecified: Secondary | ICD-10-CM

## 2018-04-13 DIAGNOSIS — I10 Essential (primary) hypertension: Secondary | ICD-10-CM | POA: Diagnosis not present

## 2018-04-13 DIAGNOSIS — Z9889 Other specified postprocedural states: Secondary | ICD-10-CM

## 2018-04-13 DIAGNOSIS — E1159 Type 2 diabetes mellitus with other circulatory complications: Secondary | ICD-10-CM | POA: Diagnosis not present

## 2018-04-13 DIAGNOSIS — E119 Type 2 diabetes mellitus without complications: Secondary | ICD-10-CM | POA: Diagnosis not present

## 2018-04-13 DIAGNOSIS — D508 Other iron deficiency anemias: Secondary | ICD-10-CM | POA: Diagnosis not present

## 2018-04-13 DIAGNOSIS — D869 Sarcoidosis, unspecified: Secondary | ICD-10-CM

## 2018-04-13 DIAGNOSIS — M1A9XX0 Chronic gout, unspecified, without tophus (tophi): Secondary | ICD-10-CM | POA: Diagnosis not present

## 2018-04-13 DIAGNOSIS — E785 Hyperlipidemia, unspecified: Secondary | ICD-10-CM | POA: Diagnosis not present

## 2018-04-13 DIAGNOSIS — E1169 Type 2 diabetes mellitus with other specified complication: Secondary | ICD-10-CM

## 2018-04-13 DIAGNOSIS — M2550 Pain in unspecified joint: Secondary | ICD-10-CM

## 2018-04-13 LAB — CBC WITH DIFFERENTIAL/PLATELET
Basophils Absolute: 0.1 10*3/uL (ref 0.0–0.1)
Basophils Relative: 0.9 % (ref 0.0–3.0)
Eosinophils Absolute: 0.2 10*3/uL (ref 0.0–0.7)
Eosinophils Relative: 2.7 % (ref 0.0–5.0)
HCT: 31.3 % — ABNORMAL LOW (ref 36.0–46.0)
Hemoglobin: 10.3 g/dL — ABNORMAL LOW (ref 12.0–15.0)
Lymphocytes Relative: 27.6 % (ref 12.0–46.0)
Lymphs Abs: 2.3 10*3/uL (ref 0.7–4.0)
MCHC: 32.9 g/dL (ref 30.0–36.0)
MCV: 92.1 fl (ref 78.0–100.0)
Monocytes Absolute: 1 10*3/uL (ref 0.1–1.0)
Monocytes Relative: 11.5 % (ref 3.0–12.0)
Neutro Abs: 4.7 10*3/uL (ref 1.4–7.7)
Neutrophils Relative %: 57.3 % (ref 43.0–77.0)
Platelets: 470 10*3/uL — ABNORMAL HIGH (ref 150.0–400.0)
RBC: 3.4 Mil/uL — ABNORMAL LOW (ref 3.87–5.11)
RDW: 13.6 % (ref 11.5–15.5)
WBC: 8.3 10*3/uL (ref 4.0–10.5)

## 2018-04-13 LAB — TSH: TSH: 0.86 u[IU]/mL (ref 0.35–4.50)

## 2018-04-13 LAB — LIPID PANEL
Cholesterol: 235 mg/dL — ABNORMAL HIGH (ref 0–200)
HDL: 108.8 mg/dL (ref >39.00)
LDL Cholesterol: 114 mg/dL — ABNORMAL HIGH (ref 0–99)
NonHDL: 126.24
Total CHOL/HDL Ratio: 2
Triglycerides: 62 mg/dL (ref 0.0–149.0)
VLDL: 12.4 mg/dL (ref 0.0–40.0)

## 2018-04-13 LAB — COMPREHENSIVE METABOLIC PANEL
ALT: 4 U/L (ref 0–35)
AST: 14 U/L (ref 0–37)
Albumin: 3.8 g/dL (ref 3.5–5.2)
Alkaline Phosphatase: 47 U/L (ref 39–117)
BUN: 23 mg/dL (ref 6–23)
CO2: 32 mEq/L (ref 19–32)
Calcium: 9.6 mg/dL (ref 8.4–10.5)
Chloride: 100 mEq/L (ref 96–112)
Creatinine, Ser: 0.76 mg/dL (ref 0.40–1.20)
GFR: 91.78 mL/min (ref >60.00)
Glucose, Bld: 91 mg/dL (ref 70–99)
Potassium: 4.1 mEq/L (ref 3.5–5.1)
Sodium: 138 mEq/L (ref 135–145)
Total Bilirubin: 0.3 mg/dL (ref 0.2–1.2)
Total Protein: 6.2 g/dL (ref 6.0–8.3)

## 2018-04-13 LAB — URIC ACID: Uric Acid, Serum: 4.9 mg/dL (ref 2.4–7.0)

## 2018-04-13 LAB — HEMOGLOBIN A1C: Hgb A1c MFr Bld: 6.6 % — ABNORMAL HIGH (ref 4.6–6.5)

## 2018-04-13 MED ORDER — DULAGLUTIDE 0.75 MG/0.5ML ~~LOC~~ SOAJ: SUBCUTANEOUS | 0 refills | Status: DC

## 2018-04-13 MED ORDER — DULAGLUTIDE 0.75 MG/0.5ML ~~LOC~~ SOAJ: SUBCUTANEOUS | 3 refills | Status: DC

## 2018-04-13 NOTE — Patient Instructions (Addendum)
For your blood sugars:  Hold Janumet Take the Meformin daily Start the Trulicity once a week every Monday.   Follow up with [place] in 3 months   Stop Amlodipine.

## 2018-04-14 ENCOUNTER — Encounter: Payer: Self-pay | Admitting: Family Medicine

## 2018-04-14 DIAGNOSIS — I10 Essential (primary) hypertension: Principal | ICD-10-CM

## 2018-04-14 DIAGNOSIS — E1122 Type 2 diabetes mellitus with diabetic chronic kidney disease: Secondary | ICD-10-CM

## 2018-04-14 DIAGNOSIS — D869 Sarcoidosis, unspecified: Secondary | ICD-10-CM | POA: Insufficient documentation

## 2018-04-14 DIAGNOSIS — D860 Sarcoidosis of lung: Secondary | ICD-10-CM | POA: Insufficient documentation

## 2018-04-14 DIAGNOSIS — E1159 Type 2 diabetes mellitus with other circulatory complications: Secondary | ICD-10-CM | POA: Insufficient documentation

## 2018-04-14 DIAGNOSIS — M109 Gout, unspecified: Secondary | ICD-10-CM | POA: Insufficient documentation

## 2018-04-14 DIAGNOSIS — N182 Chronic kidney disease, stage 2 (mild): Secondary | ICD-10-CM

## 2018-04-14 DIAGNOSIS — I152 Hypertension secondary to endocrine disorders: Secondary | ICD-10-CM | POA: Insufficient documentation

## 2018-04-14 DIAGNOSIS — J309 Allergic rhinitis, unspecified: Secondary | ICD-10-CM | POA: Insufficient documentation

## 2018-04-14 HISTORY — DX: Type 2 diabetes mellitus with diabetic chronic kidney disease: N18.2

## 2018-04-14 HISTORY — DX: Hypertension secondary to endocrine disorders: I15.2

## 2018-04-14 HISTORY — DX: Type 2 diabetes mellitus with other circulatory complications: E11.59

## 2018-04-14 HISTORY — DX: Type 2 diabetes mellitus with diabetic chronic kidney disease: E11.22

## 2018-04-17 ENCOUNTER — Other Ambulatory Visit: Payer: Self-pay | Admitting: Family Medicine

## 2018-04-17 ENCOUNTER — Encounter: Payer: Self-pay | Admitting: Family Medicine

## 2018-04-17 DIAGNOSIS — Z9889 Other specified postprocedural states: Secondary | ICD-10-CM | POA: Insufficient documentation

## 2018-04-17 MED ORDER — ATORVASTATIN CALCIUM 20 MG PO TABS: 20.0000 mg | ORAL_TABLET | Freq: Every day | ORAL | 2 refills | Status: DC

## 2018-04-17 MED ORDER — FUROSEMIDE 40 MG PO TABS: 20.0000 mg | ORAL_TABLET | Freq: Every day | ORAL | 2 refills | Status: DC

## 2018-04-17 NOTE — Assessment & Plan Note (Signed)
Lab Results  Component Value Date   LABURIC 4.9 04/13/2018   Patient is taking allopurinol for history of gout.  She has no focal joint pain today.  Will monitor.  She may not need allopurinol in the future.

## 2018-04-17 NOTE — Telephone Encounter (Signed)
Requested medication (s) are due for refill today: amount dispensed not specified  Requested medication (s) are on the active medication list: yes yo both -both are historical meds  Last refill:  Atorvastatin: 05/29/17      Furosemide: 02/10/13  Future visit scheduled: yes  Notes to clinic:  Pt is established with Dr Juleen China   Requested Prescriptions  Pending Prescriptions Disp Refills   atorvastatin (LIPITOR) 20 MG tablet      Sig: Take 1 tablet (20 mg total) by mouth at bedtime.     Cardiovascular:  Antilipid - Statins Failed - 04/17/2018 11:50 AM      Failed - Total Cholesterol in normal range and within 360 days    Cholesterol  Date Value Ref Range Status  04/13/2018 235 (H) 0 - 200 mg/dL Final    Comment:    ATP III Classification       Desirable:  < 200 mg/dL               Borderline High:  200 - 239 mg/dL          High:  > = 240 mg/dL         Failed - LDL in normal range and within 360 days    LDL Cholesterol  Date Value Ref Range Status  04/13/2018 114 (H) 0 - 99 mg/dL Final         Passed - HDL in normal range and within 360 days    HDL  Date Value Ref Range Status  04/13/2018 108.80 >39.00 mg/dL Final         Passed - Triglycerides in normal range and within 360 days    Triglycerides  Date Value Ref Range Status  04/13/2018 62.0 0.0 - 149.0 mg/dL Final    Comment:    Normal:  <150 mg/dLBorderline High:  150 - 199 mg/dL         Passed - Patient is not pregnant      Passed - Valid encounter within last 12 months    Recent Outpatient Visits          4 days ago Hypertension associated with diabetes ([place])   [place], [place], DO   3 months ago Chronic low back pain, unspecified back pain laterality, unspecified whether sciatica present   [place], [place], DO   3 months ago Lumbar radiculopathy   [place], [place], DO   4 months ago [place], Olevia Bowens, DO   5 months ago Muscle cramp   [place], [place], DO      Future Appointments            In 2 months Briscoe Deutscher, DO [facility]          furosemide (LASIX) 40 MG tablet 30 tablet     Sig: Take 0.5 tablets (20 mg total) by mouth daily. 20mg  on M,W,F     Cardiovascular:  Diuretics - Loop Passed - 04/17/2018 11:50 AM      Passed - K in normal range and within 360 days    Potassium  Date Value Ref Range Status  04/13/2018 4.1 3.5 - 5.1 mEq/L Final         Passed - Ca in normal range and within 360 days    Calcium  Date Value Ref Range Status  04/13/2018  9.6 8.4 - 10.5 mg/dL Final         Passed - Na in normal range and within 360 days    Sodium  Date Value Ref Range Status  04/13/2018 138 135 - 145 mEq/L Final         Passed - Cr in normal range and within 360 days    Creat  Date Value Ref Range Status  08/25/2013 1.16 (H) 0.50 - 1.10 mg/dL Final   Creatinine, Ser  Date Value Ref Range Status  04/13/2018 0.76 0.40 - 1.20 mg/dL Final         Passed - Last BP in normal range    BP Readings from Last 1 Encounters:  04/13/18 126/82         Passed - Valid encounter within last 6 months    Recent Outpatient Visits          4 days ago Hypertension associated with diabetes [facility])   [place], [place], DO   3 months ago Chronic low back pain, unspecified back pain laterality, unspecified whether sciatica present   [place], [place], DO   3 months ago Lumbar radiculopathy   [place], Olevia Bowens, DO   4 months ago [place], Olevia Bowens, DO   5 months ago Muscle cramp   [place], [place], DO      Future Appointments            In 2 months Briscoe Deutscher,  [place], [facility]

## 2018-04-17 NOTE — Assessment & Plan Note (Signed)
Review: taking medications as instructed, no medication side effects noted, no TIAs, no chest pain on exertion, no dyspnea on exertion, no swelling of ankles. Smoker: No.   BP Readings from Last 3 Encounters:  04/13/18 126/82  02/27/18 (!) 146/83  01/13/18 120/80   Lab Results  Component Value Date   CREATININE 0.76 04/13/2018   CREATININE 0.91 02/02/2018   CREATININE 0.77 07/01/2017     Patient has a strange regimen of taking her medication on Monday Wednesday and Friday.  It is Monday morning and she has not taken medication today.  Her blood pressure is controlled at 126/82.  I wonder if she is having some low blood pressures on days that she is taking the medication.  Okay to stop medication for now and monitor blood pressure.  If SBP elevates consistently above 130, she will let me know.  She is diabetic, so microalbumin will be monitored during this time as well.

## 2018-04-17 NOTE — Assessment & Plan Note (Signed)
Basic Metabolic Panel: Recent Labs  Lab 04/13/18 1128  NA 138  K 4.1  CL 100  CO2 32  GLUCOSE 91  BUN 23  CREATININE 0.76  CALCIUM 9.6   Liver Function Tests: Recent Labs  Lab 04/13/18 1128  AST 14  ALT 4  ALKPHOS 47  BILITOT 0.3  PROT 6.2  ALBUMIN 3.8   CBC: Recent Labs  Lab 04/13/18 1128  WBC 8.3  NEUTROABS 4.7  HGB 10.3*  HCT 31.3*  MCV 92.1  PLT 470.0*

## 2018-04-17 NOTE — Assessment & Plan Note (Signed)
Recent. Doing well. Ready to get better so that she can rehab and get back to exercise. Followed by Neurosurgery and SM.

## 2018-04-17 NOTE — Assessment & Plan Note (Signed)
CBC Latest Ref Rng & Units 04/13/2018 11/25/2017 07/01/2017  WBC 4.0 - 10.5 K/uL 8.3 6.0 5.5  Hemoglobin 12.0 - 15.0 g/dL 10.3(L) 11.8(L) 11.0(L)  Hematocrit 36.0 - 46.0 % 31.3(L) 35.8(L) 33.4(L)  Platelets 150.0 - 400.0 K/uL 470.0(H) 270.0 212.0   Lab Results  Component Value Date   IRON 70 11/25/2017   FERRITIN 42.9 11/25/2017   No results found for: VITAMINB12  Will obtain further labs, but will recommend iron supplementation in the meantime.

## 2018-04-17 NOTE — Telephone Encounter (Signed)
Copied from [place] [id number]. Topic: Quick Communication - Rx Refill/Question >> Apr 17, 2018 11:36 AM Alfredia Ferguson R wrote: Medication: furosemide (LASIX) 40 MG tablet , atorvastatin (LIPITOR) 20 MG tablet Has the patient contacted their pharmacy? Yes  Preferred Pharmacy (with phone number or street name): [id number] [address], [place] [phone] (Phone) [phone] (Fax)    Agent: Please be advised that RX refills may take up to 3 business days. We ask that you follow-up with your pharmacy.

## 2018-04-17 NOTE — Assessment & Plan Note (Signed)
Patient developed sarcoidosis manifestations when she was very young.  For many years, it was uncontrolled.  She was on prednisone for quite some time.  She has significant weight gain developed secondary comorbid conditions.  She decided to work on weight loss and through healthy food choices and exercise, was able to lose 85 to 90 pounds.  She has been active, dancing, and working with a Physiological scientist up until last year.  She has been frustrated with her inability to move due to back pain but has been able to maintain her weight.  Last visit with sarcoid specialist was at [facility] and several years ago.

## 2018-04-17 NOTE — Assessment & Plan Note (Signed)
Is the patient taking medications without problems? Yes. Does the patient complain of muscle aches?  No. Trying to exercise on a regular basis? Not at the moment - recent surgery. Compliant with diet? Yes.  Lab Results  Component Value Date   CHOL 235 (H) 04/13/2018   HDL 108.80 04/13/2018   LDLCALC 114 (H) 04/13/2018   TRIG 62.0 04/13/2018   CHOLHDL 2 04/13/2018   Lab Results  Component Value Date   ALT 4 04/13/2018   AST 14 04/13/2018   ALKPHOS 47 04/13/2018   BILITOT 0.3 04/13/2018

## 2018-04-17 NOTE — Telephone Encounter (Signed)
See note

## 2018-04-17 NOTE — Assessment & Plan Note (Signed)
History: Medication compliance: compliant all of the time, diabetic diet compliance: compliant most of the time, home glucose monitoring: is not performed, further diabetic ROS: no polyuria or polydipsia, no chest pain, dyspnea or TIA's, no numbness, tingling or pain in extremities. Assessment:        Diabetes Mellitus: well controlled.  Plan: 1.  Patient is counseled on appropriate foot care. 2.  BP goal < 130/80. 3.  LDL goal of < 100, HDL > 40 and TG < 150.  4.  Eye Exam yearly and Dental Exam every 6 months. 5.  Dietary recommendations: < 100 g carbohydrates daily. 6.  Physical Activity recommendations: as tolerated. 7.  Pneumovax at diagnosis and once 65+. Wait five years between first dose and dose after 65.  8.  Influenza annually.

## 2018-05-07 ENCOUNTER — Ambulatory Visit: Payer: Self-pay

## 2018-05-07 NOTE — Telephone Encounter (Signed)
Patient called and says that for years she's had bowel problems, not being able to go regularly, so she started drinking this type of tea that is purchased in a [place] store that is having her to have a BM every day. She says she didn't think anything about drinking it every night until she was around some friends who brought to her attention that something may be wrong if she has to drink tea for a BM. She says so she's calling to ask if it's something that she needs to do other than drink the tea or is it safe to keep drinking the tea. She says she was on Miralax after a procedure, but she stopped it and started with the tea. She says if she doesn't drink the tea, she will not have a BM. I advised I will send these concerns to Dr. Juleen China and someone will call with her recommendation, patient verbalized understanding.  Reason for Disposition . Health Information question, no triage required and triager able to answer question  Protocols used: INFORMATION ONLY CALL-A-AH

## 2018-05-07 NOTE — Telephone Encounter (Signed)
Please advise 

## 2018-05-07 NOTE — Telephone Encounter (Signed)
See note

## 2018-05-08 NOTE — Telephone Encounter (Signed)
I need the name of the tea and/or ingredient list.

## 2018-05-08 NOTE — Telephone Encounter (Signed)
Called patient l/m to call office  

## 2018-05-10 ENCOUNTER — Other Ambulatory Visit: Payer: Self-pay | Admitting: Family Medicine

## 2018-05-11 ENCOUNTER — Telehealth: Payer: Self-pay | Admitting: Family Medicine

## 2018-05-11 NOTE — Telephone Encounter (Signed)
Okay to use. No concerns.

## 2018-05-11 NOTE — Telephone Encounter (Signed)
Spoke to pt told her Dr. Juleen China said okay to use tea, no concerns. Pt verbalized understanding.

## 2018-05-11 NOTE — Telephone Encounter (Signed)
Left detailed message on personal voicemail Dr. Juleen China needs to know the name of the tea or ingredient list that you are using.

## 2018-05-11 NOTE — Telephone Encounter (Signed)
Copied from [place] [id number]. Topic: Quick Communication - See Telephone Encounter >> May 11, 2018 12:25 PM Burchel, Abbi R wrote: CRM for notification. See Telephone encounter for: 05/11/18.  Pt returned call to Butch Penny re: tea she was drinking to bowel regularity.    Pt states It is called Extra Strength [place] [employer] and that the box simply lists: Cassia Angustifolia (Senna) and All natural oriental herbs as ingredients.  And contains no preservatives, or chemical additives.

## 2018-05-11 NOTE — Telephone Encounter (Signed)
Added to open message and sent to Dr. Juleen China

## 2018-05-11 NOTE — Telephone Encounter (Signed)
See note

## 2018-05-11 NOTE — Telephone Encounter (Signed)
Pt returned call to Butch Penny re: tea she was drinking to bowel regularity.    Pt states It is called Extra Strength [place] [employer] and that the box simply lists: Cassia Angustifolia (Senna) and All natural oriental herbs as ingredients.  And contains no preservatives, or chemical additives.

## 2018-05-29 ENCOUNTER — Telehealth: Payer: Self-pay | Admitting: Family Medicine

## 2018-05-29 DIAGNOSIS — E119 Type 2 diabetes mellitus without complications: Secondary | ICD-10-CM

## 2018-05-29 MED ORDER — DULAGLUTIDE 1.5 MG/0.5ML ~~LOC~~ SOAJ: 0.5000 mL | SUBCUTANEOUS | 2 refills | Status: DC

## 2018-05-29 NOTE — Telephone Encounter (Signed)
Okay to send in 1.5 dose. Offer WEBEX to check in since just started the medication.

## 2018-05-29 NOTE — Telephone Encounter (Signed)
See note  Copied from [place] [id number]. Topic: General - Other >> May 29, 2018 10:47 AM Celene Kras A wrote: Reason for CRM: Pt states Dr. Juleen China mentioned increasing her Dulaglutide (TRULICITY) 0.45 WU/9.8JX SOPN [[id number]] after her first box. But the prescription was still sent in as the lower dosage. Pt states pharmacy told her to get in contact with office and have Dr. Juleen China submit a request for 1.5 ML instead of pt injecting herself 2x. Please advise.

## 2018-05-29 NOTE — Telephone Encounter (Signed)
Called pt and advised. Scheduled Vevay for pt to f/u on dose change 06/10/2018.

## 2018-05-29 NOTE — Telephone Encounter (Signed)
Copied from [place] [id number]. Topic: General - Other >> May 29, 2018 10:47 AM Celene Kras A wrote: Reason for CRM: Pt states Dr. Juleen China mentioned increasing her Dulaglutide (TRULICITY) 7.98 XQ/1.1HE SOPN [[id number]] after her first box. But the prescription was still sent in as the lower dosage. Pt states pharmacy told her to get in contact with office and have Dr. Juleen China submit a request for 1.5 ML instead of pt injecting herself 2x. Please advise.

## 2018-06-10 ENCOUNTER — Ambulatory Visit: Payer: Medicare HMO | Admitting: Family Medicine

## 2018-07-13 NOTE — Progress Notes (Signed)
Virtual Visit via Video   Due to the COVID-19 pandemic, this visit was completed with telemedicine (audio/video) technology to reduce patient and provider exposure as well as to preserve personal protective equipment.   I connected with Kristie King by a video enabled telemedicine application and verified that I am speaking with the correct person using two identifiers. Location patient: Home Location provider: [facility], Office Persons participating in the virtual visit: Kristie King, Kristie Deutscher, DO Lonell Grandchild, CMA acting as scribe for Dr. Briscoe King.   I discussed the limitations of evaluation and management by telemedicine and the availability of in person appointments. The patient expressed understanding and agreed to proceed.  Care Team   Patient Care Team: Kristie Deutscher, DO as PCP - General (Family Medicine) Lyndal Pulley, DO as Consulting Physician (Family Medicine) Vincente Liberty, MD as Consulting Physician (Pulmonary Disease) Monna Fam, MD as Consulting Physician (Ophthalmology) [facility] as Consulting Physician Zada Finders, Joyice Faster, MD as Consulting Physician (Neurosurgery) Clydell Hakim, MD as Consulting Physician (Anesthesiology)  Subjective:   HPI:   Hypertension associated with diabetes [facility]) Review: taking medications as instructed, no medication side effects noted, no TIAs, no chest pain on exertion, no dyspnea on exertion, no swelling of ankles. Smoker: No.   BP Readings from Last 3 Encounters:  04/13/18 126/82  02/27/18 (!) 146/83  01/13/18 120/80   Lab Results  Component Value Date   CREATININE 0.76 04/13/2018   CREATININE 0.91 02/02/2018   CREATININE 0.77 07/01/2017     Diabetes mellitus without complication (HCC) Medication compliance: compliant all of the time, diabetic diet compliance: compliant most of the time, home glucose monitoring: is not performed, further diabetic ROS: no polyuria or polydipsia,  no chest pain, dyspnea or TIA's, no numbness, tingling or pain in extremities.  Hyperlipidemia associated with type 2 diabetes mellitus ([place]) Lab Results  Component Value Date   CHOL 235 (H) 04/13/2018   HDL 108.80 04/13/2018   LDLCALC 114 (H) 04/13/2018   TRIG 62.0 04/13/2018   CHOLHDL 2 04/13/2018   Lab Results  Component Value Date   ALT 4 04/13/2018   AST 14 04/13/2018   ALKPHOS 47 04/13/2018   BILITOT 0.3 04/13/2018     Sarcoidosis, with skin manifestations Asks about a new medication - fasenra.  CKD stage 2 due to type 2 diabetes mellitus ([place]) Lab Results  Component Value Date   CREATININE 0.76 04/13/2018   Other iron deficiency anemia CBC Latest Ref Rng & Units 04/13/2018 11/25/2017 07/01/2017  WBC 4.0 - 10.5 K/uL 8.3 6.0 5.5  Hemoglobin 12.0 - 15.0 g/dL 10.3(L) 11.8(L) 11.0(L)  Hematocrit 36.0 - 46.0 % 31.3(L) 35.8(L) 33.4(L)  Platelets 150.0 - 400.0 K/uL 470.0(H) 270.0 212.0   Review of Systems  Constitutional: Negative for chills, fever, malaise/fatigue and weight loss.  Respiratory: Negative for cough, shortness of breath and wheezing.   Cardiovascular: Negative for chest pain, palpitations and leg swelling.  Gastrointestinal: Negative for abdominal pain, constipation, diarrhea, nausea and vomiting.  Genitourinary: Negative for dysuria and urgency.  Musculoskeletal: Negative for joint pain and myalgias.  Skin: Negative for rash.  Neurological: Negative for dizziness and headaches.  Psychiatric/Behavioral: Negative for depression, substance abuse and suicidal ideas. The patient is not nervous/anxious.     Patient Active Problem List   Diagnosis Date Noted  . Hx of decompressive lumbar laminectomy 04/17/2018  . CKD stage 2 due to type 2 diabetes mellitus ([place]) 04/14/2018  . Hypertension associated with diabetes ([place]) 04/14/2018  .  Sarcoidosis, with skin manifestations 04/14/2018  . Allergic rhinitis 04/14/2018  . Gout, on Allopurinol 04/14/2018  .  Nonallopathic lesion of lumbosacral region 08/12/2017  . Nonallopathic lesion of sacral region 08/12/2017  . Nonallopathic lesion of thoracic region 08/12/2017  . Lumbar spinal stenosis 01/21/2017  . AC (acromioclavicular) arthritis 11/13/2016  . Iron deficiency anemia 10/16/2016  . Vitamin D deficiency 10/16/2016  . Cervical radiculopathy at C6 09/10/2016  . Osteoarthritis of spine with radiculopathy, cervical region 08/03/2016  . Rotator cuff tear 04/12/2014  . Loss of transverse plantar arch of left foot 11/16/2013  . Abnormality of gait 11/16/2013  . Synovitis of knee 09/01/2013  . Hyperlipidemia associated with type 2 diabetes mellitus ([place]) 09/17/2012  . Type 2 diabetes mellitus with other specified complication [facility])     Social History   Tobacco Use  . Smoking status: Former Smoker    Years: 31.00    Types: Cigarettes    Last attempt to quit: 01/14/2013    Years since quitting: 5.4  . Smokeless tobacco: Never Used  Substance Use Topics  . Alcohol use: Yes    Comment: 09/17/2012 "drink on special occasions only"    Current Outpatient Medications:  .  atorvastatin (LIPITOR) 20 MG tablet, Take 1 tablet (20 mg total) by mouth at bedtime., Disp: 30 tablet, Rfl: 2 .  Dulaglutide (TRULICITY) 1.5 HU/7.6LY SOPN, Inject 0.5 mLs into the skin once a week., Disp: 4 pen, Rfl: 2 .  furosemide (LASIX) 40 MG tablet, Take 0.5 tablets (20 mg total) by mouth daily. 86m on M,W,F, Disp: 30 tablet, Rfl: 2 .  meloxicam (MOBIC) 15 MG tablet, , Disp: , Rfl:  .  metFORMIN (GLUCOPHAGE) 1000 MG tablet, Mon, wed fri, Disp: , Rfl:  .  traZODone (DESYREL) 50 MG tablet, Take 0.5-1 tablets (25-50 mg total) by mouth at bedtime as needed for sleep., Disp: 30 tablet, Rfl: 3  No Known Allergies  Objective:   VITALS: Per patient if applicable, see vitals. GENERAL: Alert, appears well and in no acute distress. HEENT: Atraumatic, conjunctiva clear, no obvious abnormalities on inspection of external nose  and ears. NECK: Normal movements of the head and neck. CARDIOPULMONARY: No increased WOB. Speaking in clear sentences. I:E ratio WNL.  MS: Moves all visible extremities without noticeable abnormality. PSYCH: Pleasant and cooperative, well-groomed. Speech normal rate and rhythm. Affect is appropriate. Insight and judgement are appropriate. Attention is focused, linear, and appropriate.  NEURO: CN grossly intact. Oriented as arrived to appointment on time with no prompting. Moves both UE equally.  SKIN: No obvious lesions, wounds, erythema, or cyanosis noted on face or hands.  Assessment and Plan:   JMyeishawas seen today for follow-up, diabetes, chronic kidney disease and hyperlipidemia.  Diagnoses and all orders for this visit:  Hypertension associated with diabetes ([facility] [place]  Type 2 diabetes mellitus with other specified complication, without long-term current use of insulin ([place]  Hyperlipidemia associated with type 2 diabetes mellitus ([place]  CKD stage 2 due to type 2 diabetes mellitus ([place] -     Comp Met (CMET); Future  Anemia, unspecified type -     Iron, TIBC and Ferritin Panel; Future -     CBC with Differential/Platelet; Future -     Vitamin B12; Future  Primary insomnia -     traZODone (DESYREL) 50 MG tablet; Take 0.5-1 tablets (25-50 mg total) by mouth at bedtime as needed for sleep.    .Marland KitchenCOVID-19 Education: The signs and symptoms of COVID-19 were discussed with  the patient and how to seek care for testing if needed. The importance of social distancing was discussed today. . Reviewed expectations re: course of current medical issues. . Discussed self-management of symptoms. . Outlined signs and symptoms indicating need for more acute intervention. . Patient verbalized understanding and all questions were answered. Marland Kitchen Health Maintenance issues including appropriate healthy diet, exercise, and smoking avoidance were discussed with patient. . See orders for this visit as  documented in the electronic medical record.  Kristie Deutscher, DO  Records requested if needed. Time spent: 25 minutes, of which >50% was spent in obtaining information about her symptoms, reviewing her previous labs, evaluations, and treatments, counseling her about her condition (please see the discussed topics above), and developing a plan to further investigate it; she had a number of questions which I addressed.

## 2018-07-14 ENCOUNTER — Other Ambulatory Visit: Payer: Self-pay

## 2018-07-14 ENCOUNTER — Telehealth (INDEPENDENT_AMBULATORY_CARE_PROVIDER_SITE_OTHER): Payer: Medicare HMO | Admitting: Family Medicine

## 2018-07-14 ENCOUNTER — Encounter: Payer: Self-pay | Admitting: Family Medicine

## 2018-07-14 VITALS — Ht 63.0 in | Wt 140.0 lb

## 2018-07-14 DIAGNOSIS — E785 Hyperlipidemia, unspecified: Secondary | ICD-10-CM | POA: Diagnosis not present

## 2018-07-14 DIAGNOSIS — N182 Chronic kidney disease, stage 2 (mild): Secondary | ICD-10-CM

## 2018-07-14 DIAGNOSIS — I10 Essential (primary) hypertension: Secondary | ICD-10-CM

## 2018-07-14 DIAGNOSIS — E1122 Type 2 diabetes mellitus with diabetic chronic kidney disease: Secondary | ICD-10-CM | POA: Diagnosis not present

## 2018-07-14 DIAGNOSIS — D649 Anemia, unspecified: Secondary | ICD-10-CM | POA: Diagnosis not present

## 2018-07-14 DIAGNOSIS — E1159 Type 2 diabetes mellitus with other circulatory complications: Secondary | ICD-10-CM | POA: Diagnosis not present

## 2018-07-14 DIAGNOSIS — R69 Illness, unspecified: Secondary | ICD-10-CM | POA: Diagnosis not present

## 2018-07-14 DIAGNOSIS — F5101 Primary insomnia: Secondary | ICD-10-CM

## 2018-07-14 DIAGNOSIS — E1169 Type 2 diabetes mellitus with other specified complication: Secondary | ICD-10-CM | POA: Diagnosis not present

## 2018-07-14 DIAGNOSIS — I152 Hypertension secondary to endocrine disorders: Secondary | ICD-10-CM

## 2018-07-14 MED ORDER — TRAZODONE HCL 50 MG PO TABS: 25.0000 mg | ORAL_TABLET | Freq: Every evening | ORAL | 3 refills | Status: DC | PRN

## 2018-07-15 ENCOUNTER — Other Ambulatory Visit (INDEPENDENT_AMBULATORY_CARE_PROVIDER_SITE_OTHER): Payer: Medicare HMO

## 2018-07-15 DIAGNOSIS — N182 Chronic kidney disease, stage 2 (mild): Secondary | ICD-10-CM | POA: Diagnosis not present

## 2018-07-15 DIAGNOSIS — M2550 Pain in unspecified joint: Secondary | ICD-10-CM

## 2018-07-15 DIAGNOSIS — E1122 Type 2 diabetes mellitus with diabetic chronic kidney disease: Secondary | ICD-10-CM | POA: Diagnosis not present

## 2018-07-15 DIAGNOSIS — D508 Other iron deficiency anemias: Secondary | ICD-10-CM | POA: Diagnosis not present

## 2018-07-15 DIAGNOSIS — M48062 Spinal stenosis, lumbar region with neurogenic claudication: Secondary | ICD-10-CM | POA: Diagnosis not present

## 2018-07-15 DIAGNOSIS — D649 Anemia, unspecified: Secondary | ICD-10-CM

## 2018-07-15 LAB — CBC WITH DIFFERENTIAL/PLATELET
Basophils Absolute: 0 10*3/uL (ref 0.0–0.1)
Basophils Relative: 0.6 % (ref 0.0–3.0)
Eosinophils Absolute: 0.5 10*3/uL (ref 0.0–0.7)
Eosinophils Relative: 7.6 % — ABNORMAL HIGH (ref 0.0–5.0)
HCT: 31.6 % — ABNORMAL LOW (ref 36.0–46.0)
Hemoglobin: 10.7 g/dL — ABNORMAL LOW (ref 12.0–15.0)
Lymphocytes Relative: 42.3 % (ref 12.0–46.0)
Lymphs Abs: 2.6 10*3/uL (ref 0.7–4.0)
MCHC: 33.8 g/dL (ref 30.0–36.0)
MCV: 87.6 fl (ref 78.0–100.0)
Monocytes Absolute: 0.6 10*3/uL (ref 0.1–1.0)
Monocytes Relative: 10.3 % (ref 3.0–12.0)
Neutro Abs: 2.4 10*3/uL (ref 1.4–7.7)
Neutrophils Relative %: 39.2 % — ABNORMAL LOW (ref 43.0–77.0)
Platelets: 242 10*3/uL (ref 150.0–400.0)
RBC: 3.6 Mil/uL — ABNORMAL LOW (ref 3.87–5.11)
RDW: 14.4 % (ref 11.5–15.5)
WBC: 6.1 10*3/uL (ref 4.0–10.5)

## 2018-07-15 LAB — COMPREHENSIVE METABOLIC PANEL
ALT: 5 U/L (ref 0–35)
AST: 18 U/L (ref 0–37)
Albumin: 4.2 g/dL (ref 3.5–5.2)
Alkaline Phosphatase: 46 U/L (ref 39–117)
BUN: 23 mg/dL (ref 6–23)
CO2: 30 mEq/L (ref 19–32)
Calcium: 9.4 mg/dL (ref 8.4–10.5)
Chloride: 102 mEq/L (ref 96–112)
Creatinine, Ser: 1.01 mg/dL (ref 0.40–1.20)
GFR: 66.05 mL/min (ref >60.00)
Glucose, Bld: 83 mg/dL (ref 70–99)
Potassium: 3.4 mEq/L — ABNORMAL LOW (ref 3.5–5.1)
Sodium: 140 mEq/L (ref 135–145)
Total Bilirubin: 0.5 mg/dL (ref 0.2–1.2)
Total Protein: 6.6 g/dL (ref 6.0–8.3)

## 2018-07-15 LAB — HEMOGLOBIN A1C: Hgb A1c MFr Bld: 6.7 % — ABNORMAL HIGH (ref 4.6–6.5)

## 2018-07-15 LAB — VITAMIN B12: Vitamin B-12: 348 pg/mL (ref 211–911)

## 2018-07-17 ENCOUNTER — Encounter: Payer: Self-pay | Admitting: Family Medicine

## 2018-07-17 LAB — IRON,TIBC AND FERRITIN PANEL
%SAT: 17 % (calc) (ref 16–45)
Ferritin: 87 ng/mL (ref 16–288)
Iron: 48 ug/dL (ref 45–160)
TIBC: 279 mcg/dL (calc) (ref 250–450)

## 2018-07-17 LAB — CANCER ANTIGEN 19-9: CA 19-9: 6 U/mL (ref <34)

## 2018-07-20 ENCOUNTER — Encounter: Payer: Self-pay | Admitting: Family Medicine

## 2018-07-22 ENCOUNTER — Telehealth: Payer: Self-pay | Admitting: Family Medicine

## 2018-07-22 NOTE — Telephone Encounter (Signed)
Okay to send photo. I'll address labs with that message.

## 2018-07-22 NOTE — Telephone Encounter (Signed)
Dr. Juleen China, should pt continue on iron supplement?  Also, OK for her to send photo or red area on thigh since she just had visit 07/14/18?

## 2018-07-22 NOTE — Telephone Encounter (Signed)
Copied from [place] [id number]. Topic: General - Other >> Jul 22, 2018  1:28 PM Nils Flack, Marland Kitchen wrote: Reason for CRM: pt would like call to further discuss her labs. She saw on my chart they were normal, and is wanting to know if she still needs to take the iron pills.  Also, pt has red place on her thigh and wants to know if she needs to make and appt or if she can send a photo to Dr.  Please call [phone]

## 2018-07-23 NOTE — Telephone Encounter (Signed)
See note

## 2018-07-23 NOTE — Telephone Encounter (Signed)
Patient called back to ask what mg of Iron she should be taking and if there will be an Rx sent to the pharmacy. She is also asking for a call back today since she is becoming a little concerned about the spot on her leg. Please call patient at Ph# [phone]

## 2018-07-24 ENCOUNTER — Encounter: Payer: Self-pay | Admitting: Physician Assistant

## 2018-07-24 ENCOUNTER — Encounter: Payer: Self-pay | Admitting: Family Medicine

## 2018-07-24 ENCOUNTER — Ambulatory Visit (INDEPENDENT_AMBULATORY_CARE_PROVIDER_SITE_OTHER): Payer: Medicare HMO | Admitting: Physician Assistant

## 2018-07-24 DIAGNOSIS — L03116 Cellulitis of left lower limb: Secondary | ICD-10-CM

## 2018-07-24 MED ORDER — DOXYCYCLINE HYCLATE 100 MG PO TABS: 100.0000 mg | ORAL_TABLET | Freq: Two times a day (BID) | ORAL | 0 refills | Status: DC

## 2018-07-24 NOTE — Telephone Encounter (Signed)
Called pt and advised OK to take Ferrous Sulfate 325/Fe 65. If this causes constipation can take Metamucil or Miralax if no relief with Metamucil.   She has a single red bump on her leg, seems like there is some inflammation. Has no changed in size or color. There is no increased warmth or drainage, fever.   I scheduled pt with [name] for acute virtual visit today.

## 2018-07-24 NOTE — Telephone Encounter (Signed)
Left message to return call to our office.  

## 2018-07-24 NOTE — Progress Notes (Signed)
Virtual Visit via Video   I connected with Kristie King on 07/24/18 at  1:00 PM EDT by a video enabled telemedicine application and verified that I am speaking with the correct person using two identifiers. Location patient: Home Location provider: [facility], Office Persons participating in the virtual visit: [place] A [place], [name] PA-C  I discussed the limitations of evaluation and management by telemedicine and the availability of in person appointments. The patient expressed understanding and agreed to proceed.  Subjective:   HPI:   Patient reports a single, red bump on her L outer upper thigh x a few days. She was in her attic a few days ago, and doesn't know if maybe something bit her. She noticed the area that night. Has not opened/drained. Denies: warmth, fever, chills, trauma to area. Used Triamcinolone with some relief. Used alcohol and neosporin without noticeable difference.  ROS: See pertinent positives and negatives per HPI.  Patient Active Problem List   Diagnosis Date Noted  . Hx of decompressive lumbar laminectomy 04/17/2018  . CKD stage 2 due to type 2 diabetes mellitus ([place]) 04/14/2018  . Hypertension associated with diabetes ([place]) 04/14/2018  . Sarcoidosis, with skin manifestations 04/14/2018  . Allergic rhinitis 04/14/2018  . Gout, on Allopurinol 04/14/2018  . Nonallopathic lesion of lumbosacral region 08/12/2017  . Nonallopathic lesion of sacral region 08/12/2017  . Nonallopathic lesion of thoracic region 08/12/2017  . Lumbar spinal stenosis 01/21/2017  . AC (acromioclavicular) arthritis 11/13/2016  . Iron deficiency anemia 10/16/2016  . Vitamin D deficiency 10/16/2016  . Cervical radiculopathy at C6 09/10/2016  . Osteoarthritis of spine with radiculopathy, cervical region 08/03/2016  . Rotator cuff tear 04/12/2014  . Loss of transverse plantar arch of left foot 11/16/2013  . Abnormality of gait 11/16/2013  . Synovitis of knee 09/01/2013  .  Hyperlipidemia associated with type 2 diabetes mellitus ([place]) 09/17/2012  . Type 2 diabetes mellitus with other specified complication [facility])     Social History   Tobacco Use  . Smoking status: Former Smoker    Years: 31.00    Types: Cigarettes    Last attempt to quit: 01/14/2013    Years since quitting: 5.5  . Smokeless tobacco: Never Used  Substance Use Topics  . Alcohol use: Yes    Comment: 09/17/2012 "drink on special occasions only"    Current Outpatient Medications:  .  atorvastatin (LIPITOR) 20 MG tablet, Take 1 tablet (20 mg total) by mouth at bedtime., Disp: 30 tablet, Rfl: 2 .  doxycycline (VIBRA-TABS) 100 MG tablet, Take 1 tablet (100 mg total) by mouth 2 (two) times daily., Disp: 20 tablet, Rfl: 0 .  Dulaglutide (TRULICITY) 1.5 VZ/8.5YI SOPN, Inject 0.5 mLs into the skin once a week., Disp: 4 pen, Rfl: 2 .  furosemide (LASIX) 40 MG tablet, Take 0.5 tablets (20 mg total) by mouth daily. 20mg  on M,W,F, Disp: 30 tablet, Rfl: 2 .  meloxicam (MOBIC) 15 MG tablet, , Disp: , Rfl:  .  metFORMIN (GLUCOPHAGE) 1000 MG tablet, Mon, wed fri, Disp: , Rfl:  .  traZODone (DESYREL) 50 MG tablet, Take 0.5-1 tablets (25-50 mg total) by mouth at bedtime as needed for sleep., Disp: 30 tablet, Rfl: 3  No Known Allergies  Objective:   VITALS: Per patient if applicable, see vitals. GENERAL: Alert, appears well and in no acute distress. HEENT: Atraumatic, conjunctiva clear, no obvious abnormalities on inspection of external nose and ears. NECK: Normal movements of the head and neck. CARDIOPULMONARY: No  increased WOB. Speaking in clear sentences. I:E ratio WNL.  MS: Moves all visible extremities without noticeable abnormality. PSYCH: Pleasant and cooperative, well-groomed. Speech normal rate and rhythm. Affect is appropriate. Insight and judgement are appropriate. Attention is focused, linear, and appropriate.  NEURO: CN grossly intact. Oriented as arrived to appointment on time with no  prompting. Moves both UE equally.  SKIN: Raised erythematous area to R outer upper thigh with small white appearing pinpoint center (patient denies tenderness with self-palpation); no discharge evident  Assessment and Plan:   Diagnoses and all orders for this visit:  Cellulitis of left lower extremity Appears to be a small area of cellulitis. Will treat with doxycycline given diabetic status. Worsening precautions reviewed.   Other orders -     doxycycline (VIBRA-TABS) 100 MG tablet; Take 1 tablet (100 mg total) by mouth 2 (two) times daily.  . Reviewed expectations re: course of current medical issues. . Discussed self-management of symptoms. . Outlined signs and symptoms indicating need for more acute intervention. . Patient verbalized understanding and all questions were answered. Marland Kitchen Health Maintenance issues including appropriate healthy diet, exercise, and smoking avoidance were discussed with patient. . See orders for this visit as documented in the electronic medical record.  I discussed the assessment and treatment plan with the patient. The patient was provided an opportunity to ask questions and all were answered. The patient agreed with the plan and demonstrated an understanding of the instructions.   The patient was advised to call back or seek an in-person evaluation if the symptoms worsen or if the condition fails to improve as anticipated.   CMA or LPN served as scribe during this visit. History, Physical, and Plan performed by medical provider. The above documentation has been reviewed and is accurate and complete.   [place], [place] 07/24/2018

## 2018-07-28 ENCOUNTER — Other Ambulatory Visit: Payer: Self-pay | Admitting: Family Medicine

## 2018-07-28 DIAGNOSIS — E119 Type 2 diabetes mellitus without complications: Secondary | ICD-10-CM

## 2018-07-28 NOTE — Telephone Encounter (Signed)
Copied from [place] [id number]. Topic: Quick Communication - Rx Refill/Question >> Jul 28, 2018  2:47 PM Leward Quan A wrote: Medication: metFORMIN (GLUCOPHAGE) 1000 MG tablet   Completely out of medication  Has the patient contacted their pharmacy? Yes.   (Agent: If no, request that the patient contact the pharmacy for the refill.) (Agent: If yes, when and what did the pharmacy advise?)  Preferred Pharmacy (with phone number [facility] street name): [id number] [address], [place] [phone] (Phone) [phone] (Fax)    Agent: Please be advised that RX refills may take up to 3 business days. We ask that you follow-up with your pharmacy.

## 2018-07-28 NOTE — Telephone Encounter (Signed)
See request °

## 2018-07-29 MED ORDER — METFORMIN HCL 1000 MG PO TABS: 1000.0000 mg | ORAL_TABLET | Freq: Every day | ORAL | 1 refills | Status: DC

## 2018-08-06 ENCOUNTER — Other Ambulatory Visit: Payer: Self-pay | Admitting: Family Medicine

## 2018-08-06 DIAGNOSIS — F5101 Primary insomnia: Secondary | ICD-10-CM

## 2018-08-12 ENCOUNTER — Other Ambulatory Visit: Payer: Self-pay | Admitting: *Deleted

## 2018-08-12 DIAGNOSIS — R6889 Other general symptoms and signs: Secondary | ICD-10-CM | POA: Diagnosis not present

## 2018-08-12 DIAGNOSIS — Z20822 Contact with and (suspected) exposure to covid-19: Secondary | ICD-10-CM

## 2018-08-12 NOTE — Progress Notes (Signed)
[id number] 

## 2018-08-14 ENCOUNTER — Telehealth: Payer: Self-pay

## 2018-08-14 LAB — NOVEL CORONAVIRUS, NAA: SARS-CoV-2, NAA: NOT DETECTED

## 2018-08-14 NOTE — Telephone Encounter (Signed)
Pt. Called back and given negative COVID 19 results. Verbalizes understanding. 

## 2018-08-28 ENCOUNTER — Telehealth: Payer: Self-pay

## 2018-08-28 ENCOUNTER — Other Ambulatory Visit: Payer: Self-pay

## 2018-08-28 DIAGNOSIS — E1169 Type 2 diabetes mellitus with other specified complication: Secondary | ICD-10-CM

## 2018-08-28 MED ORDER — LIRAGLUTIDE 18 MG/3ML ~~LOC~~ SOPN: 1.8000 mg | PEN_INJECTOR | Freq: Every day | SUBCUTANEOUS | 1 refills | Status: DC

## 2018-08-28 NOTE — Telephone Encounter (Signed)
Copied from [place] [id number]. Topic: General - Other >> Aug 28, 2018 11:32 AM Leward Quan A wrote: Reason for CRM: Patient called to say that the Dulaglutide (TRULICITY) 1.5 TX/7.7SF SOPN is too expensive now that she is in the donut hole with the insurance. But the insurance did inform her that they do pay for Victoza if Dr Juleen China wanted to prescribe that. Please advise patient can be reached at Ph# [phone]

## 2018-08-28 NOTE — Progress Notes (Signed)
Rx sent in for Victoza, per Dr. Juleen China.

## 2018-08-28 NOTE — Telephone Encounter (Signed)
Ok to change

## 2018-08-28 NOTE — Telephone Encounter (Signed)
Rx changed and sent in to pharmacy.  Patient aware.

## 2018-08-28 NOTE — Telephone Encounter (Signed)
Okay to change to Victoza 1.8 mg [place] daily.

## 2018-09-15 ENCOUNTER — Encounter: Payer: Self-pay | Admitting: Family Medicine

## 2018-09-15 DIAGNOSIS — R609 Edema, unspecified: Secondary | ICD-10-CM

## 2018-09-17 ENCOUNTER — Other Ambulatory Visit: Payer: Self-pay

## 2018-09-17 ENCOUNTER — Other Ambulatory Visit (INDEPENDENT_AMBULATORY_CARE_PROVIDER_SITE_OTHER): Payer: Medicare HMO

## 2018-09-17 DIAGNOSIS — R609 Edema, unspecified: Secondary | ICD-10-CM | POA: Diagnosis not present

## 2018-09-17 LAB — CBC WITH DIFFERENTIAL/PLATELET
Basophils Absolute: 0.1 10*3/uL (ref 0.0–0.1)
Basophils Relative: 1 % (ref 0.0–3.0)
Eosinophils Absolute: 0.5 10*3/uL (ref 0.0–0.7)
Eosinophils Relative: 8.2 % — ABNORMAL HIGH (ref 0.0–5.0)
HCT: 32.7 % — ABNORMAL LOW (ref 36.0–46.0)
Hemoglobin: 10.7 g/dL — ABNORMAL LOW (ref 12.0–15.0)
Lymphocytes Relative: 36.6 % (ref 12.0–46.0)
Lymphs Abs: 2.3 10*3/uL (ref 0.7–4.0)
MCHC: 32.7 g/dL (ref 30.0–36.0)
MCV: 88.8 fl (ref 78.0–100.0)
Monocytes Absolute: 0.6 10*3/uL (ref 0.1–1.0)
Monocytes Relative: 9.6 % (ref 3.0–12.0)
Neutro Abs: 2.8 10*3/uL (ref 1.4–7.7)
Neutrophils Relative %: 44.6 % (ref 43.0–77.0)
Platelets: 276 10*3/uL (ref 150.0–400.0)
RBC: 3.69 Mil/uL — ABNORMAL LOW (ref 3.87–5.11)
RDW: 14.7 % (ref 11.5–15.5)
WBC: 6.3 10*3/uL (ref 4.0–10.5)

## 2018-09-17 LAB — COMPREHENSIVE METABOLIC PANEL
ALT: 5 U/L (ref 0–35)
AST: 19 U/L (ref 0–37)
Albumin: 4.5 g/dL (ref 3.5–5.2)
Alkaline Phosphatase: 42 U/L (ref 39–117)
BUN: 35 mg/dL — ABNORMAL HIGH (ref 6–23)
CO2: 27 mEq/L (ref 19–32)
Calcium: 9.9 mg/dL (ref 8.4–10.5)
Chloride: 101 mEq/L (ref 96–112)
Creatinine, Ser: 1.31 mg/dL — ABNORMAL HIGH (ref 0.40–1.20)
GFR: 48.9 mL/min — ABNORMAL LOW (ref >60.00)
Glucose, Bld: 83 mg/dL (ref 70–99)
Potassium: 3.7 mEq/L (ref 3.5–5.1)
Sodium: 138 mEq/L (ref 135–145)
Total Bilirubin: 0.8 mg/dL (ref 0.2–1.2)
Total Protein: 6.8 g/dL (ref 6.0–8.3)

## 2018-09-17 LAB — BRAIN NATRIURETIC PEPTIDE: Pro B Natriuretic peptide (BNP): 12 pg/mL (ref 0.0–100.0)

## 2018-09-17 LAB — MAGNESIUM: Magnesium: 1.6 mg/dL (ref 1.5–2.5)

## 2018-09-17 NOTE — Telephone Encounter (Signed)
Called patient apps made and orders placed. No questions at this time.

## 2018-09-18 ENCOUNTER — Other Ambulatory Visit: Payer: Medicare HMO

## 2018-09-18 DIAGNOSIS — M48062 Spinal stenosis, lumbar region with neurogenic claudication: Secondary | ICD-10-CM | POA: Diagnosis not present

## 2018-09-21 ENCOUNTER — Ambulatory Visit (INDEPENDENT_AMBULATORY_CARE_PROVIDER_SITE_OTHER): Payer: Medicare HMO | Admitting: Family Medicine

## 2018-09-21 ENCOUNTER — Encounter: Payer: Self-pay | Admitting: Family Medicine

## 2018-09-21 VITALS — Ht 63.0 in | Wt 140.0 lb

## 2018-09-21 DIAGNOSIS — N182 Chronic kidney disease, stage 2 (mild): Secondary | ICD-10-CM

## 2018-09-21 DIAGNOSIS — E1122 Type 2 diabetes mellitus with diabetic chronic kidney disease: Secondary | ICD-10-CM | POA: Diagnosis not present

## 2018-09-21 DIAGNOSIS — E785 Hyperlipidemia, unspecified: Secondary | ICD-10-CM | POA: Diagnosis not present

## 2018-09-21 DIAGNOSIS — M545 Low back pain, unspecified: Secondary | ICD-10-CM

## 2018-09-21 DIAGNOSIS — I10 Essential (primary) hypertension: Secondary | ICD-10-CM

## 2018-09-21 DIAGNOSIS — E1169 Type 2 diabetes mellitus with other specified complication: Secondary | ICD-10-CM

## 2018-09-21 DIAGNOSIS — I152 Hypertension secondary to endocrine disorders: Secondary | ICD-10-CM

## 2018-09-21 DIAGNOSIS — E1159 Type 2 diabetes mellitus with other circulatory complications: Secondary | ICD-10-CM

## 2018-09-21 NOTE — Progress Notes (Signed)
Virtual Visit via Video   Due to the COVID-19 pandemic, this visit was completed with telemedicine (audio/video) technology to reduce patient and provider exposure as well as to preserve personal protective equipment.   I connected with Kristie King by a video enabled telemedicine application and verified that I am speaking with the correct person using two identifiers. Location patient: Home Location provider: [facility], Office Persons participating in the virtual visit: Kristie King, Briscoe Deutscher, DO   I discussed the limitations of evaluation and management by telemedicine and the availability of in person appointments. The patient expressed understanding and agreed to proceed.  Care Team   Patient Care Team: Briscoe Deutscher, DO as PCP - General (Family Medicine) Lyndal Pulley, DO as Consulting Physician (Family Medicine) Vincente Liberty, MD as Consulting Physician (Pulmonary Disease) Monna Fam, MD as Consulting Physician (Ophthalmology) [facility] as Consulting Physician Zada Finders, Joyice Faster, MD as Consulting Physician (Neurosurgery) Clydell Hakim, MD as Consulting Physician (Anesthesiology)  Subjective:   HPI: Patient sent message with concern for new and acute lower extremity edema. Labs below obtained, showing that patient mild dehydrated with AKI. Instructed to drink plenty of fluids. Feeling much better. Edema resolved.   Results for orders placed or performed in visit on 09/17/18  Brain natriuretic peptide  Result Value Ref Range   Pro B Natriuretic peptide (BNP) 12.0 0.0 - 100.0 pg/mL  Magnesium  Result Value Ref Range   Magnesium 1.6 1.5 - 2.5 mg/dL  Comprehensive metabolic panel  Result Value Ref Range   Sodium 138 135 - 145 mEq/L   Potassium 3.7 3.5 - 5.1 mEq/L   Chloride 101 96 - 112 mEq/L   CO2 27 19 - 32 mEq/L   Glucose, Bld 83 70 - 99 mg/dL   BUN 35 (H) 6 - 23 mg/dL   Creatinine, Ser 1.31 (H) 0.40 - 1.20 mg/dL   Total  Bilirubin 0.8 0.2 - 1.2 mg/dL   Alkaline Phosphatase 42 39 - 117 U/L   AST 19 0 - 37 U/L   ALT 5 0 - 35 U/L   Total Protein 6.8 6.0 - 8.3 g/dL   Albumin 4.5 3.5 - 5.2 g/dL   Calcium 9.9 8.4 - 10.5 mg/dL   GFR 48.90 (L) >60.00 mL/min  CBC with Differential/Platelet  Result Value Ref Range   WBC 6.3 4.0 - 10.5 K/uL   RBC 3.69 (L) 3.87 - 5.11 Mil/uL   Hemoglobin 10.7 (L) 12.0 - 15.0 g/dL   HCT 32.7 (L) 36.0 - 46.0 %   MCV 88.8 78.0 - 100.0 fl   MCHC 32.7 30.0 - 36.0 g/dL   RDW 14.7 11.5 - 15.5 %   Platelets 276.0 150.0 - 400.0 K/uL   Neutrophils Relative % 44.6 43.0 - 77.0 %   Lymphocytes Relative 36.6 12.0 - 46.0 %   Monocytes Relative 9.6 3.0 - 12.0 %   Eosinophils Relative 8.2 (H) 0.0 - 5.0 %   Basophils Relative 1.0 0.0 - 3.0 %   Neutro Abs 2.8 1.4 - 7.7 K/uL   Lymphs Abs 2.3 0.7 - 4.0 K/uL   Monocytes Absolute 0.6 0.1 - 1.0 K/uL   Eosinophils Absolute 0.5 0.0 - 0.7 K/uL   Basophils Absolute 0.1 0.0 - 0.1 K/uL   Back pain. Suspected strain. Left low back, without radiation. Positional. Not improving. No treatment. Hx of back surgeries.  Review of Systems  Constitutional: Negative for chills, fever, malaise/fatigue and weight loss.  Respiratory: Negative for cough, shortness  of breath and wheezing.   Cardiovascular: Negative for chest pain, palpitations and leg swelling.  Gastrointestinal: Negative for abdominal pain, constipation, diarrhea, nausea and vomiting.  Genitourinary: Negative for dysuria and urgency.  Musculoskeletal: Positive for back pain and myalgias. Negative for joint pain.  Skin: Negative for rash.  Neurological: Negative for dizziness and headaches.  Psychiatric/Behavioral: Negative for depression, substance abuse and suicidal ideas. The patient is not nervous/anxious.     Patient Active Problem List   Diagnosis Date Noted  . Hx of decompressive lumbar laminectomy 04/17/2018  . CKD stage 2 due to type 2 diabetes mellitus ([place]) 04/14/2018  . Hypertension  associated with diabetes ([place]) 04/14/2018  . Sarcoidosis, with skin manifestations 04/14/2018  . Allergic rhinitis 04/14/2018  . Gout, on Allopurinol 04/14/2018  . Nonallopathic lesion of lumbosacral region 08/12/2017  . Nonallopathic lesion of sacral region 08/12/2017  . Nonallopathic lesion of thoracic region 08/12/2017  . Lumbar spinal stenosis 01/21/2017  . AC (acromioclavicular) arthritis 11/13/2016  . Iron deficiency anemia 10/16/2016  . Vitamin D deficiency 10/16/2016  . Cervical radiculopathy at C6 09/10/2016  . Osteoarthritis of spine with radiculopathy, cervical region 08/03/2016  . Rotator cuff tear 04/12/2014  . Loss of transverse plantar arch of left foot 11/16/2013  . Abnormality of gait 11/16/2013  . Synovitis of knee 09/01/2013  . Hyperlipidemia associated with type 2 diabetes mellitus ([place]) 09/17/2012  . Type 2 diabetes mellitus with other specified complication [facility])     Social History   Tobacco Use  . Smoking status: Former Smoker    Years: 31.00    Types: Cigarettes    Quit date: 01/14/2013    Years since quitting: 5.7  . Smokeless tobacco: Never Used  Substance Use Topics  . Alcohol use: Yes    Comment: 09/17/2012 "drink on special occasions only"    Current Outpatient Medications:  .  atorvastatin (LIPITOR) 20 MG tablet, Take 1 tablet (20 mg total) by mouth at bedtime., Disp: 30 tablet, Rfl: 2 .  Dulaglutide (TRULICITY) 1.5 BM/8.4XL SOPN, Inject 0.5 mLs into the skin once a week., Disp: 4 pen, Rfl: 2 .  furosemide (LASIX) 40 MG tablet, Take 0.5 tablets (20 mg total) by mouth daily. 20mg  on M,W,F, Disp: 30 tablet, Rfl: 2 .  meloxicam (MOBIC) 15 MG tablet, , Disp: , Rfl:  .  metFORMIN (GLUCOPHAGE) 1000 MG tablet, Take 1 tablet (1,000 mg total) by mouth daily with breakfast. Mon, wed fri, Disp: 90 tablet, Rfl: 1  No Known Allergies  Objective:   VITALS: Per patient if applicable, see vitals. GENERAL: Alert, appears well and in no acute distress. HEENT:  Atraumatic, conjunctiva clear, no obvious abnormalities on inspection of external nose and ears. NECK: Normal movements of the head and neck. CARDIOPULMONARY: No increased WOB. Speaking in clear sentences. I:E ratio WNL.  MS: Moves all visible extremities without noticeable abnormality. PSYCH: Pleasant and cooperative, well-groomed. Speech normal rate and rhythm. Affect is appropriate. Insight and judgement are appropriate. Attention is focused, linear, and appropriate.  NEURO: CN grossly intact. Oriented as arrived to appointment on time with no prompting. Moves both UE equally.  SKIN: No obvious lesions, wounds, erythema, or cyanosis noted on face or hands.  Depression screen [employer] 2/9 09/21/2018 09/21/2018  Decreased Interest 0 0  Down, Depressed, Hopeless 0 0  PHQ - 2 Score 0 0  Altered sleeping 0 -  Tired, decreased energy 0 -  Change in appetite 0 -  Feeling bad or failure about yourself  0 -  Trouble concentrating 0 -  Moving slowly or fidgety/restless 0 -  Suicidal thoughts 0 -  PHQ-9 Score 0 -  Difficult doing work/chores Not difficult at all -    Assessment and Plan:   Kristie King was seen today for lab discussion.  Diagnoses and all orders for this visit:  Acute left-sided low back pain, unspecified whether sciatica present -     Ambulatory referral to Physical Therapy  Hypertension associated with diabetes ([place])  CKD stage 2 due to type 2 diabetes mellitus ([place])  Hyperlipidemia associated with type 2 diabetes mellitus ([place])  Type 2 diabetes mellitus with other specified complication, without long-term current use of insulin ([place])   . COVID-19 Education: The signs and symptoms of COVID-19 were discussed with the patient and how to seek care for testing if needed. The importance of social distancing was discussed today. . Reviewed expectations re: course of current medical issues. . Discussed self-management of symptoms. . Outlined signs and symptoms indicating need for more  acute intervention. . Patient verbalized understanding and all questions were answered. Marland Kitchen Health Maintenance issues including appropriate healthy diet, exercise, and smoking avoidance were discussed with patient. . See orders for this visit as documented in the electronic medical record.  Briscoe Deutscher, DO  Records requested if needed. Time spent: 25 minutes, of which >50% was spent in obtaining information about her symptoms, reviewing her previous labs, evaluations, and treatments, counseling her about her condition (please see the discussed topics above), and developing a plan to further investigate it; she had a number of questions which I addressed.

## 2018-09-27 ENCOUNTER — Encounter: Payer: Self-pay | Admitting: Family Medicine

## 2018-09-30 ENCOUNTER — Other Ambulatory Visit: Payer: Self-pay

## 2018-09-30 ENCOUNTER — Ambulatory Visit: Payer: Medicare HMO | Admitting: Physical Therapy

## 2018-09-30 ENCOUNTER — Encounter: Payer: Self-pay | Admitting: Physical Therapy

## 2018-09-30 DIAGNOSIS — M545 Low back pain, unspecified: Secondary | ICD-10-CM

## 2018-09-30 NOTE — Patient Instructions (Signed)
Access Code: [email or web address]  URL: [email or web address]  Date: 09/30/2018  Prepared by: Lyndee Hensen   Exercises Supine Single Knee to Chest - 3 reps - 30 hold - 2x daily Supine Piriformis Stretch - 3 reps - 30 hold - 2x daily Child's Pose Stretch - 3 reps - 30 hold - 2x daily Standing Sidebending with Chair Support - 3 reps - 30 hold - 2x daily

## 2018-10-01 ENCOUNTER — Encounter: Payer: Self-pay | Admitting: Physician Assistant

## 2018-10-01 ENCOUNTER — Ambulatory Visit (INDEPENDENT_AMBULATORY_CARE_PROVIDER_SITE_OTHER): Payer: Medicare HMO | Admitting: Physician Assistant

## 2018-10-01 DIAGNOSIS — R591 Generalized enlarged lymph nodes: Secondary | ICD-10-CM | POA: Diagnosis not present

## 2018-10-01 NOTE — Progress Notes (Signed)
Virtual Visit via Video   I connected with Kristie King on 10/01/18 at 10:40 AM EDT by a video enabled telemedicine application and verified that I am speaking with the correct person using two identifiers. Location patient: Home Location provider: [facility], Office Persons participating in the virtual visit: Staceyann A Al, [employer].  I discussed the limitations of evaluation and management by telemedicine and the availability of in person appointments. The patient expressed understanding and agreed to proceed.  I acted as a Education administrator for [employer], PA-C [employer], LPN  Subjective:   HPI:   Lymph node Pt was at the dentist on Monday and was told swollen lymph node on left side jaw. No pain or tenderness. Pt has had sore throat off and on at night recently. Denies fever, night sweats, unintentional weight loss, other lymph nodes.  ROS: See pertinent positives and negatives per HPI.  Patient Active Problem List   Diagnosis Date Noted  . Hx of decompressive lumbar laminectomy 04/17/2018  . CKD stage 2 due to type 2 diabetes mellitus ([place]) 04/14/2018  . Hypertension associated with diabetes ([place]) 04/14/2018  . Sarcoidosis, with skin manifestations 04/14/2018  . Allergic rhinitis 04/14/2018  . Gout, on Allopurinol 04/14/2018  . Nonallopathic lesion of lumbosacral region 08/12/2017  . Nonallopathic lesion of sacral region 08/12/2017  . Nonallopathic lesion of thoracic region 08/12/2017  . Lumbar spinal stenosis 01/21/2017  . AC (acromioclavicular) arthritis 11/13/2016  . Iron deficiency anemia 10/16/2016  . Vitamin D deficiency 10/16/2016  . Cervical radiculopathy at C6 09/10/2016  . Osteoarthritis of spine with radiculopathy, cervical region 08/03/2016  . Rotator cuff tear 04/12/2014  . Loss of transverse plantar arch of left foot 11/16/2013  . Abnormality of gait 11/16/2013  . Synovitis of knee 09/01/2013  . Hyperlipidemia associated with type 2 diabetes  mellitus ([place]) 09/17/2012  . Type 2 diabetes mellitus with other specified complication [facility])     Social History   Tobacco Use  . Smoking status: Former Smoker    Years: 31.00    Types: Cigarettes    Quit date: 01/14/2013    Years since quitting: 5.7  . Smokeless tobacco: Never Used  Substance Use Topics  . Alcohol use: Yes    Comment: 09/17/2012 "drink on special occasions only"    Current Outpatient Medications:  .  atorvastatin (LIPITOR) 20 MG tablet, Take 1 tablet (20 mg total) by mouth at bedtime., Disp: 30 tablet, Rfl: 2 .  Dulaglutide (TRULICITY) 1.5 JO/8.7OM SOPN, Inject 0.5 mLs into the skin once a week., Disp: 4 pen, Rfl: 2 .  furosemide (LASIX) 40 MG tablet, Take 0.5 tablets (20 mg total) by mouth daily. 20mg  on M,W,F, Disp: 30 tablet, Rfl: 2 .  meloxicam (MOBIC) 15 MG tablet, Take 15 mg by mouth. Taking on M-W-F, Disp: , Rfl:  .  metFORMIN (GLUCOPHAGE) 1000 MG tablet, Take 1 tablet (1,000 mg total) by mouth daily with breakfast. Mon, wed fri, Disp: 90 tablet, Rfl: 1  No Known Allergies  Objective:   VITALS: Per patient if applicable, see vitals. GENERAL: Alert, appears well and in no acute distress. HEENT: Atraumatic, conjunctiva clear, no obvious abnormalities on inspection of external nose and ears. NECK: Normal movements of the head and neck. CARDIOPULMONARY: No increased WOB. Speaking in clear sentences. I:E ratio WNL.  MS: Moves all visible extremities without noticeable abnormality. PSYCH: Pleasant and cooperative, well-groomed. Speech normal rate and rhythm. Affect is appropriate. Insight and judgement are appropriate. Attention is  focused, linear, and appropriate.  NEURO: CN grossly intact. Oriented as arrived to appointment on time with no prompting. Moves both UE equally.  SKIN: No obvious lesions, wounds, erythema, or cyanosis noted on face or hands.  Assessment and Plan:   Elsia was seen today for adenopathy.  Diagnoses and all orders for this visit:   Lymphadenopathy  No red flags. Reviewed pathophysiology of lymph nodes. Discussed that if still present in two weeks or other symptoms develop, to return to office for [place] visit for evaluation, possible labs/imaging.  . Reviewed expectations re: course of current medical issues. . Discussed self-management of symptoms. . Outlined signs and symptoms indicating need for more acute intervention. . Patient verbalized understanding and all questions were answered. Marland Kitchen Health Maintenance issues including appropriate healthy diet, exercise, and smoking avoidance were discussed with patient. . See orders for this visit as documented in the electronic medical record.  I discussed the assessment and treatment plan with the patient. The patient was provided an opportunity to ask questions and all were answered. The patient agreed with the plan and demonstrated an understanding of the instructions.   The patient was advised to call back or seek an in-person evaluation if the symptoms worsen or if the condition fails to improve as anticipated.   CMA or LPN served as scribe during this visit. History, Physical, and Plan performed by medical provider. The above documentation has been reviewed and is accurate and complete.   [name], [place] 10/01/2018

## 2018-10-05 ENCOUNTER — Telehealth: Payer: Self-pay

## 2018-10-05 ENCOUNTER — Encounter: Payer: Self-pay | Admitting: Physical Therapy

## 2018-10-05 NOTE — Telephone Encounter (Signed)
Ok

## 2018-10-05 NOTE — Telephone Encounter (Signed)
Error

## 2018-10-05 NOTE — Telephone Encounter (Signed)
Ignore previous message. Samples are in fridge with pts name.

## 2018-10-05 NOTE — Therapy (Addendum)
[facility] [address] [place], [place], [address] Phone: [phone]   Fax:  [phone]  Physical Therapy Evaluation  Patient Details  Name: Kristie King MRN: [id number] Date of Birth: 11/13/1950 Referring Provider (PT): Briscoe Deutscher   Encounter Date: 09/30/2018  PT End of Session - 10/05/18 1311    Visit Number  1    Number of Visits  4    Date for PT Re-Evaluation  10/28/18    Authorization Type  [employer]    PT Start Time  1005    PT Stop Time  1045    PT Time Calculation (min)  40 min    Activity Tolerance  Patient tolerated treatment well    Behavior During Therapy  [facility] for tasks assessed/performed       Past Medical History:  Diagnosis Date  . AC (acromioclavicular) arthritis 11/13/2016  . Cervical radiculopathy at C6 09/10/2016  . CKD stage 2 due to type 2 diabetes mellitus ([place]) 04/14/2018  . Diabetes mellitus without complication ([place])   . HTN (hypertension) 09/17/2012  . Hyperlipidemia associated with type 2 diabetes mellitus ([place]) 09/17/2012  . Hypertension associated with diabetes ([place]) 04/14/2018  . Iron deficiency anemia 10/16/2016  . Osteoarthritis of spine with radiculopathy, cervical region 08/03/2016  . Vitamin D deficiency 10/16/2016    Past Surgical History:  Procedure Laterality Date  . BACK SURGERY  03/30/2018  . BUNIONECTOMY Right 02/18/2013  . BUNIONECTOMY Left 04/01/2013    There were no vitals filed for this visit.       [facility] PT Assessment - 10/05/18 0001      Assessment   Medical Diagnosis  Back Pain    Referring Provider (PT)  Briscoe Deutscher    Prior Therapy  no      Precautions   Precautions  Back    Precaution Comments  laminectomy Jan 2020      Balance Screen   Has the patient fallen in the past 6 months  No      Prior Function   Level of Independence  Independent      Cognition   Overall Cognitive Status  Within Functional Limits for tasks assessed    Attention   Focused      ROM / Strength   AROM / PROM / Strength  AROM;Strength      AROM   Overall AROM Comments  Lumbar: mod deficit for flexion, mild for ext , mild for SB, with pain bil;  Hips: [facility]      Strength   Overall Strength Comments  Lumbar: 3/5, Hips: 4-/5       Palpation   Palpation comment  tightness in bil lumbar region, Soreness at paraspinal andQL region      Special Tests   Other special tests  Neg SLR,                 Objective measurements completed on examination: See above findings.      [facility] Adult PT Treatment/Exercise - 10/05/18 0001      Exercises   Exercises  Lumbar      Lumbar Exercises: Stretches   Single Knee to Chest Stretch  3 reps;30 seconds;Right;Left    Piriformis Stretch  3 reps;30 seconds;Right;Left    Piriformis Stretch Limitations  supine fig 4    Other Lumbar Stretch Exercise  Childs pose L/R Center 30 sec x2 each;     Other Lumbar Stretch Exercise  Standing QL stretch 30 sec x2  bil;              PT Education - 10/05/18 1311    Education Details  PT POC, HEP, Exam findings    Person(s) Educated  Patient    Methods  Explanation;Demonstration;Tactile cues;Verbal cues;Handout    Comprehension  Verbalized understanding;Returned demonstration;Tactile cues required;Need further instruction;Verbal cues required          PT Long Term Goals - 10/05/18 1312      PT LONG TERM GOAL #1   Title  Pt to be independent with final HEP for back    Time  4    Period  Weeks    Status  New    Target Date  10/28/18      PT LONG TERM GOAL #2   Title  Pt to report decreased pain in back to 0-2/10 with activity    Time  4    Period  Weeks    Status  New    Target Date  10/28/18      PT LONG TERM GOAL #3   Title  Pt to demo lumbar ROM to be [facility] without pain, to improve ability for ADLS and IADLS.    Time  4    Period  Weeks    Status  New    Target Date  10/28/18             Plan - 10/05/18 1315    Clinical Impression  Statement  Pt presents with primary complaint of increased pain in low back, that has improved somewhat since onset. She did have back surgery earlier this year, with good outcome. Pt with increased muscle tightness in bil lumbar region, and QL. She has mild ROM loss, and tightenss with ROM. She has decreased ability for bending, squat, IADLS, and exercise. Pt to benefit from skilled PT to improve defiicts and pain. Pt to benefit from education on lumbar HEP, has lack of effective HEP at this time.    Personal Factors and Comorbidities  Past/Current Experience    Examination-Activity Limitations  Locomotion Level;Bend;Sit;Squat;Lift    Examination-Participation Restrictions  Community Activity;Driving    Stability/Clinical Decision Making  Stable/Uncomplicated    Clinical Decision Making  Low    Rehab Potential  Good    PT Frequency  1x / week    PT Duration  4 weeks    PT Treatment/Interventions  ADLs/Self Care Home Management;Electrical Stimulation;Cryotherapy;Iontophoresis 85m/ml Dexamethasone;Moist Heat;Traction;Ultrasound;Neuromuscular re-education;Therapeutic exercise;Therapeutic activities;Functional mobility training;Stair training;Gait training;Patient/family education;Manual techniques;Dry needling;Taping;Spinal Manipulations;Joint Manipulations    Consulted and Agree with Plan of Care  Patient       Patient will benefit from skilled therapeutic intervention in order to improve the following deficits and impairments:  Decreased range of motion, Increased muscle spasms, Decreased activity tolerance, Pain, Improper body mechanics, Impaired flexibility, Decreased strength, Decreased mobility  Visit Diagnosis: 1. Acute bilateral low back pain without sciatica        Problem List Patient Active Problem List   Diagnosis Date Noted  . Hx of decompressive lumbar laminectomy 04/17/2018  . CKD stage 2 due to type 2 diabetes mellitus ([place] 04/14/2018  . Hypertension associated with  diabetes ([place] 04/14/2018  . Sarcoidosis, with skin manifestations 04/14/2018  . Allergic rhinitis 04/14/2018  . Gout, on Allopurinol 04/14/2018  . Nonallopathic lesion of lumbosacral region 08/12/2017  . Nonallopathic lesion of sacral region 08/12/2017  . Nonallopathic lesion of thoracic region 08/12/2017  . Lumbar spinal stenosis 01/21/2017  . AC (acromioclavicular) arthritis 11/13/2016  .  Iron deficiency anemia 10/16/2016  . Vitamin D deficiency 10/16/2016  . Cervical radiculopathy at C6 09/10/2016  . Osteoarthritis of spine with radiculopathy, cervical region 08/03/2016  . Rotator cuff tear 04/12/2014  . Loss of transverse plantar arch of left foot 11/16/2013  . Abnormality of gait 11/16/2013  . Synovitis of knee 09/01/2013  . Hyperlipidemia associated with type 2 diabetes mellitus ([place]) 09/17/2012  . Type 2 diabetes mellitus with other specified complication ([place])     Lyndee Hensen, PT, DPT 1:29 PM  10/05/18    [facility] [place] [place], [place], [address] Phone: [phone]   Fax:  [phone]  Name: Kristie King MRN: [id number] Date of Birth: 07/15/1950     PHYSICAL THERAPY DISCHARGE SUMMARY  Visits from Start of Care: 1   Plan: Patient agrees to discharge.  Patient goals were met. Patient is being discharged due to meeting the stated rehab goals.  ?????     Pt cancelled other appts, she called to say that she is doing well, and does not need PT anymore.   Lyndee Hensen, PT, DPT 12:11 PM  11/16/18

## 2018-10-05 NOTE — Telephone Encounter (Signed)
Pt called in reference to wanting to know if we had any samples of trulicity. Please advise.

## 2018-10-07 ENCOUNTER — Encounter: Payer: Medicare HMO | Admitting: Physical Therapy

## 2018-10-16 ENCOUNTER — Other Ambulatory Visit: Payer: Self-pay | Admitting: Family Medicine

## 2018-11-04 ENCOUNTER — Telehealth: Payer: Self-pay | Admitting: Physical Therapy

## 2018-11-04 NOTE — Telephone Encounter (Signed)
See note  Copied from [place] [id number]. Topic: General - Other >> Nov 04, 2018  3:25 PM Rainey Pines A wrote: Patient called to inform Lauren that she is feeling much better and will callback if she needs to schedule

## 2018-11-11 ENCOUNTER — Telehealth: Payer: Self-pay | Admitting: Physical Therapy

## 2018-11-11 ENCOUNTER — Other Ambulatory Visit: Payer: Self-pay | Admitting: Family Medicine

## 2018-11-11 DIAGNOSIS — R69 Illness, unspecified: Secondary | ICD-10-CM | POA: Diagnosis not present

## 2018-11-11 NOTE — Telephone Encounter (Signed)
Called l/m she is due for pneumonia

## 2018-11-11 NOTE — Telephone Encounter (Signed)
Copied from [place] [id number]. Topic: General - Inquiry >> Nov 11, 2018  3:29 PM Berneta Levins wrote: Reason for CRM:  Pt states pharmacist told her she may be due for pneumonia shot.  Pt wants to know if that is correct.

## 2018-11-16 ENCOUNTER — Other Ambulatory Visit: Payer: Self-pay | Admitting: Family Medicine

## 2018-11-23 ENCOUNTER — Other Ambulatory Visit: Payer: Self-pay | Admitting: Family Medicine

## 2018-12-15 ENCOUNTER — Other Ambulatory Visit: Payer: Self-pay | Admitting: Family Medicine

## 2019-01-21 ENCOUNTER — Other Ambulatory Visit: Payer: Self-pay | Admitting: Cardiology

## 2019-01-21 DIAGNOSIS — Z20822 Contact with and (suspected) exposure to covid-19: Secondary | ICD-10-CM

## 2019-01-25 LAB — NOVEL CORONAVIRUS, NAA: SARS-CoV-2, NAA: NOT DETECTED

## 2019-02-05 DIAGNOSIS — Z1231 Encounter for screening mammogram for malignant neoplasm of breast: Secondary | ICD-10-CM | POA: Diagnosis not present

## 2019-02-05 LAB — HM MAMMOGRAPHY

## 2019-02-09 DIAGNOSIS — U071 COVID-19: Secondary | ICD-10-CM | POA: Diagnosis not present

## 2019-02-12 DIAGNOSIS — Z20828 Contact with and (suspected) exposure to other viral communicable diseases: Secondary | ICD-10-CM | POA: Diagnosis not present

## 2019-02-16 ENCOUNTER — Other Ambulatory Visit: Payer: Self-pay | Admitting: Family Medicine

## 2019-02-16 NOTE — Telephone Encounter (Signed)
Lvm for patient to call back and r/s appt

## 2019-02-16 NOTE — Telephone Encounter (Signed)
Pt also would like new rx  on meloxicam and atorvastatin 20 mg. [facility] [address]

## 2019-02-16 NOTE — Telephone Encounter (Signed)
Erin, please contact patient to reschedule TOC appt in [place] (provider will be out of office) be sure to change the visit type to TOC. Provider will decide if they will approve refill request until visit. Please put scheduled appointment date in the telephone message.  Clinical team, please be advised, he had/has a TOC appt with Dr. Rogers Blocker.

## 2019-02-17 ENCOUNTER — Other Ambulatory Visit: Payer: Self-pay | Admitting: Family Medicine

## 2019-02-17 MED ORDER — ATORVASTATIN CALCIUM 20 MG PO TABS: 20.0000 mg | ORAL_TABLET | Freq: Every day | ORAL | 0 refills | Status: DC

## 2019-02-17 NOTE — Telephone Encounter (Signed)
Sent in medication and will fill until I see him next year.  Orma Flaming, MD [place]

## 2019-02-17 NOTE — Telephone Encounter (Signed)
I sent in cholesterol med. His renal function was actually bumped up last lab draw so I do not want him taking the mobic.  Orma Flaming, MD [place]

## 2019-02-17 NOTE — Telephone Encounter (Signed)
Spoke to patient and advised of notes per Dr. Rogers Blocker.  She verbalized understanding.  She did state that she has been having some lower back pain recently and was asking what she could take in the interim until she comes in to see Dr. Rogers Blocker for her 04/16/19 appt.  Advised plain Tylenol OTC esp due to elevated kidney functions.  Patient verbalized understanding.

## 2019-02-17 NOTE — Telephone Encounter (Signed)
Pt also called in for refill for meloxicam and atorvastatin in the note below/ please advise

## 2019-02-17 NOTE — Telephone Encounter (Signed)
See note

## 2019-02-17 NOTE — Addendum Note (Signed)
Addended by: Orma Flaming on: 02/17/2019 03:33 PM   Modules accepted: Orders

## 2019-02-18 DIAGNOSIS — H35033 Hypertensive retinopathy, bilateral: Secondary | ICD-10-CM | POA: Diagnosis not present

## 2019-02-18 DIAGNOSIS — H35363 Drusen (degenerative) of macula, bilateral: Secondary | ICD-10-CM | POA: Diagnosis not present

## 2019-02-18 DIAGNOSIS — H35013 Changes in retinal vascular appearance, bilateral: Secondary | ICD-10-CM | POA: Diagnosis not present

## 2019-02-18 DIAGNOSIS — E119 Type 2 diabetes mellitus without complications: Secondary | ICD-10-CM | POA: Diagnosis not present

## 2019-02-18 LAB — HM DIABETES EYE EXAM

## 2019-03-19 DIAGNOSIS — R69 Illness, unspecified: Secondary | ICD-10-CM | POA: Diagnosis not present

## 2019-03-19 DIAGNOSIS — L608 Other nail disorders: Secondary | ICD-10-CM | POA: Diagnosis not present

## 2019-03-19 DIAGNOSIS — L602 Onychogryphosis: Secondary | ICD-10-CM | POA: Diagnosis not present

## 2019-03-31 ENCOUNTER — Ambulatory Visit: Payer: Medicare HMO | Admitting: Family Medicine

## 2019-04-07 DIAGNOSIS — R69 Illness, unspecified: Secondary | ICD-10-CM | POA: Diagnosis not present

## 2019-04-16 ENCOUNTER — Ambulatory Visit (INDEPENDENT_AMBULATORY_CARE_PROVIDER_SITE_OTHER): Payer: Medicare HMO

## 2019-04-16 ENCOUNTER — Ambulatory Visit (INDEPENDENT_AMBULATORY_CARE_PROVIDER_SITE_OTHER): Payer: Medicare HMO | Admitting: Family Medicine

## 2019-04-16 ENCOUNTER — Encounter: Payer: Self-pay | Admitting: Family Medicine

## 2019-04-16 ENCOUNTER — Other Ambulatory Visit: Payer: Self-pay

## 2019-04-16 ENCOUNTER — Other Ambulatory Visit: Payer: Self-pay | Admitting: Family Medicine

## 2019-04-16 VITALS — BP 170/80 | HR 62 | Ht 63.0 in | Wt 147.6 lb

## 2019-04-16 VITALS — BP 170/80 | HR 62 | Ht 63.0 in | Wt 147.7 lb

## 2019-04-16 DIAGNOSIS — Z0000 Encounter for general adult medical examination without abnormal findings: Secondary | ICD-10-CM | POA: Diagnosis not present

## 2019-04-16 DIAGNOSIS — Z1159 Encounter for screening for other viral diseases: Secondary | ICD-10-CM | POA: Diagnosis not present

## 2019-04-16 DIAGNOSIS — I152 Hypertension secondary to endocrine disorders: Secondary | ICD-10-CM

## 2019-04-16 DIAGNOSIS — I10 Essential (primary) hypertension: Secondary | ICD-10-CM | POA: Diagnosis not present

## 2019-04-16 DIAGNOSIS — M2550 Pain in unspecified joint: Secondary | ICD-10-CM

## 2019-04-16 DIAGNOSIS — M545 Low back pain: Secondary | ICD-10-CM

## 2019-04-16 DIAGNOSIS — E559 Vitamin D deficiency, unspecified: Secondary | ICD-10-CM | POA: Diagnosis not present

## 2019-04-16 DIAGNOSIS — E1169 Type 2 diabetes mellitus with other specified complication: Secondary | ICD-10-CM

## 2019-04-16 DIAGNOSIS — N959 Unspecified menopausal and perimenopausal disorder: Secondary | ICD-10-CM | POA: Diagnosis not present

## 2019-04-16 DIAGNOSIS — E1159 Type 2 diabetes mellitus with other circulatory complications: Secondary | ICD-10-CM | POA: Diagnosis not present

## 2019-04-16 DIAGNOSIS — Z780 Asymptomatic menopausal state: Secondary | ICD-10-CM

## 2019-04-16 DIAGNOSIS — E785 Hyperlipidemia, unspecified: Secondary | ICD-10-CM | POA: Diagnosis not present

## 2019-04-16 DIAGNOSIS — G8929 Other chronic pain: Secondary | ICD-10-CM

## 2019-04-16 LAB — COMPREHENSIVE METABOLIC PANEL
ALT: 6 U/L (ref 0–35)
AST: 17 U/L (ref 0–37)
Albumin: 4.2 g/dL (ref 3.5–5.2)
Alkaline Phosphatase: 52 U/L (ref 39–117)
BUN: 13 mg/dL (ref 6–23)
CO2: 30 mEq/L (ref 19–32)
Calcium: 9.8 mg/dL (ref 8.4–10.5)
Chloride: 106 mEq/L (ref 96–112)
Creatinine, Ser: 0.81 mg/dL (ref 0.40–1.20)
GFR: 85.02 mL/min (ref >60.00)
Glucose, Bld: 81 mg/dL (ref 70–99)
Potassium: 3.9 mEq/L (ref 3.5–5.1)
Sodium: 142 mEq/L (ref 135–145)
Total Bilirubin: 0.7 mg/dL (ref 0.2–1.2)
Total Protein: 6.9 g/dL (ref 6.0–8.3)

## 2019-04-16 LAB — CBC WITH DIFFERENTIAL/PLATELET
Basophils Absolute: 0 10*3/uL (ref 0.0–0.1)
Basophils Relative: 0.6 % (ref 0.0–3.0)
Eosinophils Absolute: 0.2 10*3/uL (ref 0.0–0.7)
Eosinophils Relative: 3.3 % (ref 0.0–5.0)
HCT: 32.8 % — ABNORMAL LOW (ref 36.0–46.0)
Hemoglobin: 10.6 g/dL — ABNORMAL LOW (ref 12.0–15.0)
Lymphocytes Relative: 32.1 % (ref 12.0–46.0)
Lymphs Abs: 2.1 10*3/uL (ref 0.7–4.0)
MCHC: 32.4 g/dL (ref 30.0–36.0)
MCV: 89 fl (ref 78.0–100.0)
Monocytes Absolute: 0.7 10*3/uL (ref 0.1–1.0)
Monocytes Relative: 10.5 % (ref 3.0–12.0)
Neutro Abs: 3.5 10*3/uL (ref 1.4–7.7)
Neutrophils Relative %: 53.5 % (ref 43.0–77.0)
Platelets: 262 10*3/uL (ref 150.0–400.0)
RBC: 3.69 Mil/uL — ABNORMAL LOW (ref 3.87–5.11)
RDW: 13.8 % (ref 11.5–15.5)
WBC: 6.5 10*3/uL (ref 4.0–10.5)

## 2019-04-16 LAB — MICROALBUMIN / CREATININE URINE RATIO
Creatinine,U: 46.5 mg/dL
Microalb Creat Ratio: 1.5 mg/g (ref 0.0–30.0)
Microalb, Ur: <0.7 mg/dL (ref 0.0–1.9)

## 2019-04-16 LAB — LIPID PANEL
Cholesterol: 237 mg/dL — ABNORMAL HIGH (ref 0–200)
HDL: 149.8 mg/dL (ref >39.00)
LDL Cholesterol: 80 mg/dL (ref 0–99)
NonHDL: 87.63
Total CHOL/HDL Ratio: 2
Triglycerides: 40 mg/dL (ref 0.0–149.0)
VLDL: 8 mg/dL (ref 0.0–40.0)

## 2019-04-16 LAB — TSH: TSH: 0.99 u[IU]/mL (ref 0.35–4.50)

## 2019-04-16 LAB — VITAMIN D 25 HYDROXY (VIT D DEFICIENCY, FRACTURES): VITD: 15.55 ng/mL — ABNORMAL LOW (ref 30.00–100.00)

## 2019-04-16 LAB — HEMOGLOBIN A1C: Hgb A1c MFr Bld: 6.4 % (ref 4.6–6.5)

## 2019-04-16 MED ORDER — FUROSEMIDE 40 MG PO TABS: 40.0000 mg | ORAL_TABLET | Freq: Every day | ORAL | 2 refills | Status: DC

## 2019-04-16 NOTE — Progress Notes (Signed)
Subjective:   Kristie King is a 69 y.o. female who presents for an Initial Medicare Annual Wellness Visit.  Review of Systems     Cardiac Risk Factors include: advanced age (>34men, >49 women);hypertension;diabetes mellitus    Objective:    Today's Vitals   04/16/19 1137  BP: (!) 170/80  Pulse: 62  Weight: 147 lb 11.3 oz (67 kg)  Height: 5\' 3"  (1.6 m)   Body mass index is 26.17 kg/m.  Advanced Directives 04/16/2019 09/30/2018 09/17/2012  Does Patient Have a Medical Advance Directive? Yes No Patient does not have advance directive;Patient would like information  Type of Advance Directive [place] - -  Does patient want to make changes to medical advance directive? No - Patient declined - -  Copy of [place] in Chart? No - copy requested - -  Would patient like information on creating a medical advance directive? - No - Patient declined Advance directive packet given    Current Medications (verified) Outpatient Encounter Medications as of 04/16/2019  Medication Sig   atorvastatin (LIPITOR) 20 MG tablet Take 1 tablet (20 mg total) by mouth daily at 6 PM.   furosemide (LASIX) 40 MG tablet Take 1 tablet (40 mg total) by mouth daily.   metFORMIN (GLUCOPHAGE) 1000 MG tablet Take 1 tablet (1,000 mg total) by mouth daily with breakfast. Mon, wed fri   TRULICITY 1.5 0000000 SOPN INJECT 0.5 MLS INTO THE SKIN ONCE A WEEK.   No facility-administered encounter medications on file as of 04/16/2019.    Allergies (verified) Patient has no known allergies.   History: Past Medical History:  Diagnosis Date   AC (acromioclavicular) arthritis 11/13/2016   Cervical radiculopathy at C6 09/10/2016   CKD stage 2 due to type 2 diabetes mellitus ([place]) 04/14/2018   Diabetes mellitus without complication (HCC)    History of lumbar laminectomy 2019   HTN (hypertension) 09/17/2012   Hyperlipidemia associated with type 2 diabetes mellitus ([place])  09/17/2012   Hypertension associated with diabetes ([place]) 04/14/2018   Iron deficiency anemia 10/16/2016   Osteoarthritis of spine with radiculopathy, cervical region 08/03/2016   Vitamin D deficiency 10/16/2016   Past Surgical History:  Procedure Laterality Date   BACK SURGERY  03/30/2018   BUNIONECTOMY Right 02/18/2013   BUNIONECTOMY Left 04/01/2013   Family History  Problem Relation Age of Onset   Cancer Mother    Stroke Father    Hyperlipidemia Sister    Diabetes Brother    Hypertension Brother    Diabetes Sister    Hypertension Sister    Stroke Maternal Grandfather    Social History   Socioeconomic History   Marital status: Married    Spouse name: Not on file   Number of children: 0   Years of education: Not on file   Highest education level: Not on file  Occupational History   Occupation: Retired    Fish farm manager: [employer]  Tobacco Use   Smoking status: Former Smoker    Years: 31.00    Types: Cigarettes    Quit date: 01/14/2013    Years since quitting: 6.2   Smokeless tobacco: Never Used  Substance and Sexual Activity   Alcohol use: Yes    Comment: 09/17/2012 "drink on special occasions only"   Drug use: No   Sexual activity: Yes    Partners: Male  Other Topics Concern   Not on file  Social History Narrative   Increased stress with impending death of brother  whom lives in [place] Strain:    Difficulty of Paying Living Expenses: Not on file  Food Insecurity:    Worried About Charity fundraiser in the Last Year: Not on file   [employer] of Food in the Last Year: Not on file  Transportation Needs:    Lack of Transportation (Medical): Not on file   Lack of Transportation (Non-Medical): Not on file  Physical Activity:    Days of Exercise per Week: Not on file   Minutes of Exercise per Session: Not on file  Stress:    Feeling of Stress : Not on file  Social Connections:     Frequency of Communication with Friends and Family: Not on file   Frequency of Social Gatherings with Friends and Family: Not on file   Attends Religious Services: Not on file   Active Member of Clubs or Organizations: Not on file   Attends Archivist Meetings: Not on file   Marital Status: Not on file    Tobacco Counseling Counseling given: Not Answered   Clinical Intake:  Pre-visit preparation completed: Yes  Diabetes: Yes(labs drawn today) CBG done?: No Did pt. bring in CBG monitor from home?: No  How often do you need to have someone help you when you read instructions, pamphlets, or other written materials from your doctor or pharmacy?: 1 - Never  Interpreter Needed?: No  Information entered by :: Denman George LPN   Activities of Daily Living In your present state of health, do you have any difficulty performing the following activities: 04/16/2019  Hearing? N  Vision? N  Difficulty concentrating or making decisions? N  Walking or climbing stairs? N  Dressing or bathing? N  Doing errands, shopping? N  Preparing Food and eating ? N  Using the Toilet? N  In the past six months, have you accidently leaked urine? N  Do you have problems with loss of bowel control? N  Managing your Medications? N  Managing your Finances? N  Housekeeping or managing your Housekeeping? N  Some recent data might be hidden     Immunizations and Health Maintenance Immunization History  Administered Date(s) Administered   Fluad Quad(high Dose 65+) 11/11/2018   Influenza, High Dose Seasonal PF 12/12/2017   Influenza,inj,Quad PF,6+ Mos 03/09/2015   Influenza-Unspecified 02/07/2017   Health Maintenance Due  Topic Date Due   FOOT EXAM  01/14/1961   URINE MICROALBUMIN  01/14/1961   TETANUS/TDAP  01/14/1970   COLONOSCOPY  01/14/2001   DEXA SCAN  01/15/2016   PNA vac Low Risk Adult (1 of 2 - PCV13) 01/15/2016   HEMOGLOBIN A1C  01/15/2019    Patient Care  Team: Orma Flaming, MD as PCP - General (Family Medicine) Lyndal Pulley, DO as Consulting Physician (Family Medicine) Vincente Liberty, MD as Consulting Physician (Pulmonary Disease) Monna Fam, MD as Consulting Physician (Ophthalmology) [facility] as Consulting Physician [place], Joyice Faster, MD as Consulting Physician (Neurosurgery) Clydell Hakim, MD as Consulting Physician (Anesthesiology) Lyndee Hensen, PT as Physical Therapist (Physical Therapy)  Indicate any recent Medical Services you may have received from other than [facility] providers in the past year (date may be approximate).     Assessment:   This is a routine wellness examination for [place].  Hearing/Vision screen No exam data present  Dietary issues and exercise activities discussed: Current Exercise Habits: Home exercise routine, Type of exercise: treadmill, Time (Minutes): 30, Frequency (Times/Week): 4,  Weekly Exercise (Minutes/Week): 120, Intensity: Mild  Goals   None    Depression Screen PHQ 2/9 Scores 04/16/2019 09/21/2018 09/21/2018  PHQ - 2 Score 0 0 0  PHQ- 9 Score - 0 -    Fall Risk Fall Risk  04/16/2019  Falls in the past year? 0  Number falls in past yr: 0  Injury with Fall? 0  Follow up Falls evaluation completed;Education provided;Falls prevention discussed    Is the patient's home free of loose throw rugs in walkways, pet beds, electrical cords, etc?   yes      Grab bars in the bathroom? yes      Handrails on the stairs?   yes      Adequate lighting?   yes  Timed Get Up and Go Performed completed and within normal timeframe; no gait abnormalities noted   Cognitive Function: Cognitive Testing  Alert? Yes         Normal Appearance? Yes  Oriented to person? Yes           Place? Yes  Time? Yes  Recall of three objects? Yes  Can perform simple calculations? Yes  Displays appropriate judgment? Yes  Can read the correct time from a watch face? Yes   Screening  Tests Health Maintenance  Topic Date Due   FOOT EXAM  01/14/1961   URINE MICROALBUMIN  01/14/1961   TETANUS/TDAP  01/14/1970   COLONOSCOPY  01/14/2001   DEXA SCAN  01/15/2016   PNA vac Low Risk Adult (1 of 2 - PCV13) 01/15/2016   HEMOGLOBIN A1C  01/15/2019   OPHTHALMOLOGY EXAM  02/18/2020   MAMMOGRAM  02/04/2021   INFLUENZA VACCINE  Completed   Hepatitis C Screening  Completed    Qualifies for Shingles Vaccine? Discussed and patient will check with pharmacy for coverage.  Patient education handout provided   Cancer Screenings: Lung: Low Dose CT Chest recommended if Age 37-80 years, 30 pack-year currently smoking OR have quit w/in 15years. Patient does not qualify. Breast: Up to date on Mammogram? Yes   Up to date of Bone Density/Dexa? Yes; ordered today  Colorectal: colonoscopy 12/02/16  Additional Screenings:  Hepatitis C Screening: completed 04/16/19    Plan:  I have personally reviewed and addressed the Medicare Annual Wellness questionnaire and have noted the following in the patients chart:  A. Medical and social history B. Use of alcohol, tobacco or illicit drugs  C. Current medications and supplements D. Functional ability and status E.  Nutritional status F.  Physical activity G. Advance directives H. List of other physicians I.  Hospitalizations, surgeries, and ER visits in previous 12 months J.  [place] such as hearing and vision if needed, cognitive and depression L. Referrals, records requested, and appointments- none   In addition, I have reviewed and discussed with patient certain preventive protocols, quality metrics, and best practice recommendations. A written personalized care plan for preventive services as well as general preventive health recommendations were provided to patient.   Signed,  Denman George, LPN  Nurse Health Advisor   Nurse Notes: no additional

## 2019-04-16 NOTE — Patient Instructions (Addendum)
[name] , Thank you for taking time to come for your Medicare Wellness Visit. I appreciate your ongoing commitment to your health goals. Please review the following plan we discussed and let me know if I can assist you in the future.   Screening recommendations/referrals: Colorectal Screening: up to date; last colonoscopy 12/02/16 Mammogram: up to date; last 02/05/19 Bone Density: ordered today   Vision and Dental Exams: Recommended annual ophthalmology exams for early detection of glaucoma and other disorders of the eye Recommended annual dental exams for proper oral hygiene  Diabetic Exams: Diabetic Eye Exam: recommended yearly; up to date  Diabetic Foot Exam: recommended yearly  Vaccinations: Influenza vaccine: completed 11/11/18 Pneumococcal vaccine: recommended  Tdap vaccine: recommended; Please call your insurance company to determine your out of pocket expense. You may also receive this vaccine at your local pharmacy or Health Dept. Shingles vaccine: Please call your insurance company to determine your out of pocket expense for the Shingrix vaccine. You may receive this vaccine at your local pharmacy. (see attached handout)  Advanced directives: I have provided a copy for you to complete at home and have notarized. Once this is complete please bring a copy in to our office so we can scan it into your chart.  Goals: Recommend to drink at least 6-8 8oz glasses of water per day and consume a balanced diet rich in fresh fruits and vegetables.   Next appointment: Please schedule your Annual Wellness Visit with your Nurse Health Advisor in one year.  Preventive Care 5 Years and Older, Female Preventive care refers to lifestyle choices and visits with your health care provider that can promote health and wellness. What does preventive care include?  A yearly physical exam. This is also called an annual well check.  Dental exams once or twice a year.  Routine eye exams. Ask your  health care provider how often you should have your eyes checked.  Personal lifestyle choices, including:  Daily care of your teeth and gums.  Regular physical activity.  Eating a healthy diet.  Avoiding tobacco and drug use.  Limiting alcohol use.  Practicing safe sex.  Taking low-dose aspirin every day if recommended by your health care provider.  Taking vitamin and mineral supplements as recommended by your health care provider. What happens during an annual well check? The services and screenings done by your health care provider during your annual well check will depend on your age, overall health, lifestyle risk factors, and family history of disease. Counseling  Your health care provider may ask you questions about your:  Alcohol use.  Tobacco use.  Drug use.  Emotional well-being.  Home and relationship well-being.  Sexual activity.  Eating habits.  History of falls.  Memory and ability to understand (cognition).  Work and work Statistician.  Reproductive health. Screening  You may have the following tests or measurements:  Height, weight, and BMI.  Blood pressure.  Lipid and cholesterol levels. These may be checked every 5 years, or more frequently if you are over 16 years old.  Skin check.  Lung cancer screening. You may have this screening every year starting at age 41 if you have a 30-pack-year history of smoking and currently smoke or have quit within the past 15 years.  Fecal occult blood test (FOBT) of the stool. You may have this test every year starting at age 22.  Flexible sigmoidoscopy or colonoscopy. You may have a sigmoidoscopy every 5 years or a colonoscopy every 10 years starting  at age 92.  Hepatitis C blood test.  Hepatitis B blood test.  Sexually transmitted disease (STD) testing.  Diabetes screening. This is done by checking your blood sugar (glucose) after you have not eaten for a while (fasting). You may have this done  every 1-3 years.  Bone density scan. This is done to screen for osteoporosis. You may have this done starting at age 31.  Mammogram. This may be done every 1-2 years. Talk to your health care provider about how often you should have regular mammograms. Talk with your health care provider about your test results, treatment options, and if necessary, the need for more tests. Vaccines  Your health care provider may recommend certain vaccines, such as:  Influenza vaccine. This is recommended every year.  Tetanus, diphtheria, and acellular pertussis (Tdap, Td) vaccine. You may need a Td booster every 10 years.  Zoster vaccine. You may need this after age 46.  Pneumococcal 13-valent conjugate (PCV13) vaccine. One dose is recommended after age 4.  Pneumococcal polysaccharide (PPSV23) vaccine. One dose is recommended after age 32. Talk to your health care provider about which screenings and vaccines you need and how often you need them. This information is not intended to replace advice given to you by your health care provider. Make sure you discuss any questions you have with your health care provider. Document Released: 03/17/2015 Document Revised: 11/08/2015 Document Reviewed: 12/20/2014 Elsevier Interactive Patient Education  2017 [place] Prevention in the Home Falls can cause injuries. They can happen to people of all ages. There are many things you can do to make your home safe and to help prevent falls. What can I do on the outside of my home?  Regularly fix the edges of walkways and driveways and fix any cracks.  Remove anything that might make you trip as you walk through a door, such as a raised step or threshold.  Trim any bushes or trees on the path to your home.  Use bright outdoor lighting.  Clear any walking paths of anything that might make someone trip, such as rocks or tools.  Regularly check to see if handrails are loose or broken. Make sure that both  sides of any steps have handrails.  Any raised decks and porches should have guardrails on the edges.  Have any leaves, snow, or ice cleared regularly.  Use sand or salt on walking paths during winter.  Clean up any spills in your garage right away. This includes oil or grease spills. What can I do in the bathroom?  Use night lights.  Install grab bars by the toilet and in the tub and shower. Do not use towel bars as grab bars.  Use non-skid mats or decals in the tub or shower.  If you need to sit down in the shower, use a plastic, non-slip stool.  Keep the floor dry. Clean up any water that spills on the floor as soon as it happens.  Remove soap buildup in the tub or shower regularly.  Attach bath mats securely with double-sided non-slip rug tape.  Do not have throw rugs and other things on the floor that can make you trip. What can I do in the bedroom?  Use night lights.  Make sure that you have a light by your bed that is easy to reach.  Do not use any sheets or blankets that are too big for your bed. They should not hang down onto the floor.  Have a firm  chair that has side arms. You can use this for support while you get dressed.  Do not have throw rugs and other things on the floor that can make you trip. What can I do in the kitchen?  Clean up any spills right away.  Avoid walking on wet floors.  Keep items that you use a lot in easy-to-reach places.  If you need to reach something above you, use a strong step stool that has a grab bar.  Keep electrical cords out of the way.  Do not use floor polish or wax that makes floors slippery. If you must use wax, use non-skid floor wax.  Do not have throw rugs and other things on the floor that can make you trip. What can I do with my stairs?  Do not leave any items on the stairs.  Make sure that there are handrails on both sides of the stairs and use them. Fix handrails that are broken or loose. Make sure that  handrails are as long as the stairways.  Check any carpeting to make sure that it is firmly attached to the stairs. Fix any carpet that is loose or worn.  Avoid having throw rugs at the top or bottom of the stairs. If you do have throw rugs, attach them to the floor with carpet tape.  Make sure that you have a light switch at the top of the stairs and the bottom of the stairs. If you do not have them, ask someone to add them for you. What else can I do to help prevent falls?  Wear shoes that:  Do not have high heels.  Have rubber bottoms.  Are comfortable and fit you well.  Are closed at the toe. Do not wear sandals.  If you use a stepladder:  Make sure that it is fully opened. Do not climb a closed stepladder.  Make sure that both sides of the stepladder are locked into place.  Ask someone to hold it for you, if possible.  Clearly mark and make sure that you can see:  Any grab bars or handrails.  First and last steps.  Where the edge of each step is.  Use tools that help you move around (mobility aids) if they are needed. These include:  Canes.  Walkers.  Scooters.  Crutches.  Turn on the lights when you go into a dark area. Replace any light bulbs as soon as they burn out.  Set up your furniture so you have a clear path. Avoid moving your furniture around.  If any of your floors are uneven, fix them.  If there are any pets around you, be aware of where they are.  Review your medicines with your doctor. Some medicines can make you feel dizzy. This can increase your chance of falling. Ask your doctor what other things that you can do to help prevent falls. This information is not intended to replace advice given to you by your health care provider. Make sure you discuss any questions you have with your health care provider. Document Released: 12/15/2008 Document Revised: 07/27/2015 Document Reviewed: 03/25/2014 Elsevier Interactive Patient Education  2017  [employer].

## 2019-04-16 NOTE — Progress Notes (Signed)
I have reviewed the documentation from the recent AWV done by Courtney Slade; I agree with the documentation and will follow up on any recommendations or abnormal findings as suggested. Wilfrid Hyser, MD [facility]    

## 2019-04-16 NOTE — Patient Instructions (Addendum)
-  trial of voltaren gel. Will get back labs/xray and go from there. I would send you to sports med if all normal and see what they say too.   -I want you to come back in one month for blood pressure check. Could get arm cuff and keep log for me. Cut out salt and keep exercising.   -will see you every 6 months for diabetes unless diabetes gets worse.   So nice to meet you.  Dr. Rogers Blocker

## 2019-04-16 NOTE — Progress Notes (Signed)
Patient: Kristie King MRN: [id number] DOB: August 24, 1950 PCP: No primary care provider on file.     Subjective:  Chief Complaint  Patient presents with  . Transitions Of Care  . Back Pain    lower  . Hypertension  . Diabetes    HPI: The patient is a 69 y.o. female who presents today for transition of care. She has past medical history of diabetes, HTN, hyperlipidemia.   She is on no medication for blood pressure and states she doesn't recall ever being on anything for this. Today it's high because she feels really stressed and hasn't slept. She drove in from [place] as her brother is dying and she went to be with him.   Diabetes: Patient is here for follow up of type 2 diabetes. First diagnosed mid 79s.  Currently on the following medications metformin and trulicity. Takes medications as prescribed. Last A1C was 6.7. Currently exercising and following diabetic diet.  Denies any hypoglycemic events. Denies any vision changes, nausea, vomiting, abdominal pain, ulcers/paraesthesia in feet, polyuria, polydipsia or polyphagia. Denies any chest pain, shortness of breath. Only takes metformin 3 days/week.   Hyperlipidemia: currently on atorvastatin with no issues. She had family history of stroke in her father. She does have diabetes. She does not smoke. She does exercise and does zumba and used to go to the gym, but has been at home with covid doing boot camp.   Low back pain: she has history of lumbar laminectomy in 01/2018. She was fine after this procedure. She states recently, past 6 months, she started to have lower back pain across entire lower back. Pain varies in nature. Can be 8/10 and is intermittent in nature. Described as soreness. Getting up from sitting makes it worse and some turning movements with working out also make it worse. Squatting makes it worse. She can't take ibuprofen. Tylenol helps some.  Warm baths and CBD oil has helped. She denies any recent trauma.  No radiation  down the back of her leg. No numbness or tingling down the legs. No weakness.   Review of Systems  Constitutional: Negative for chills, fatigue and fever.  HENT: Positive for nosebleeds. Negative for sneezing and sore throat.   Respiratory: Negative for cough and shortness of breath.   Cardiovascular: Negative for chest pain and palpitations.  Gastrointestinal: Negative for abdominal pain, nausea and vomiting.  Endocrine: Positive for cold intolerance.  Musculoskeletal: Positive for back pain.  Skin: Negative for pallor, rash and wound.  Neurological: Negative for dizziness and headaches.    Allergies Patient has No Known Allergies.  Past Medical History Patient  has a past medical history of AC (acromioclavicular) arthritis (11/13/2016), Cervical radiculopathy at C6 (09/10/2016), CKD stage 2 due to type 2 diabetes mellitus ([place]) (04/14/2018), Diabetes mellitus without complication ([place]), History of lumbar laminectomy (2019), HTN (hypertension) (09/17/2012), Hyperlipidemia associated with type 2 diabetes mellitus ([place]) (09/17/2012), Hypertension associated with diabetes ([place]) (04/14/2018), Iron deficiency anemia (10/16/2016), Osteoarthritis of spine with radiculopathy, cervical region (08/03/2016), and Vitamin D deficiency (10/16/2016).  Surgical History Patient  has a past surgical history that includes Bunionectomy (Right, 02/18/2013); Bunionectomy (Left, 04/01/2013); and Back surgery (03/30/2018).  Family History [name]'s family history includes Cancer in her mother; Diabetes in her brother and sister; Hyperlipidemia in her sister; Hypertension in her brother and sister; Stroke in her father and maternal grandfather.  Social History Patient  reports that she quit smoking about 6 years ago. Her smoking use included cigarettes. She quit after 31.00 years  of use. She has never used smokeless tobacco. She reports current alcohol use. She reports that she does not use drugs.     Objective: Vitals:   04/16/19 1032 04/16/19 1119  BP: (!) 162/92 (!) 170/80  Pulse: 62   SpO2: (!) 85%   Weight: 147 lb 9.6 oz (67 kg)   Height: 5\' 3"  (1.6 m)     Body mass index is 26.15 kg/m.  Physical Exam Vitals reviewed.  Constitutional:      Appearance: Normal appearance. She is well-developed and normal weight.  HENT:     Head: Normocephalic and atraumatic.     Right Ear: Tympanic membrane, ear canal and external ear normal.     Left Ear: Tympanic membrane, ear canal and external ear normal.     Mouth/Throat:     Mouth: Mucous membranes are moist.  Eyes:     Extraocular Movements: Extraocular movements intact.     Conjunctiva/sclera: Conjunctivae normal.     Pupils: Pupils are equal, round, and reactive to light.  Neck:     Thyroid: No thyromegaly.  Cardiovascular:     Rate and Rhythm: Normal rate and regular rhythm.     Heart sounds: Murmur present.  Pulmonary:     Effort: Pulmonary effort is normal.     Breath sounds: Normal breath sounds.  Abdominal:     General: Abdomen is flat. Bowel sounds are normal. There is no distension.     Palpations: Abdomen is soft.     Tenderness: There is no abdominal tenderness.  Musculoskeletal:     Cervical back: Normal range of motion and neck supple.     Comments: Negative straight leg test bilaterally. TTP over her L4-L5 area across entire back. Strength intact in lower legs bilaterally. DTR normal bilaterally. Gait normal.   Lymphadenopathy:     Cervical: No cervical adenopathy.  Skin:    General: Skin is warm and dry.     Capillary Refill: Capillary refill takes less than 2 seconds.     Findings: No rash.  Neurological:     General: No focal deficit present.     Mental Status: She is alert and oriented to person, place, and time.     Cranial Nerves: No cranial nerve deficit.     Sensory: No sensory deficit.     Coordination: Coordination normal.     Deep Tendon Reflexes: Reflexes normal.  Psychiatric:         Mood and Affect: Mood normal.        Behavior: Behavior normal.      Clinical Support from 04/16/2019 in [place]  PHQ-2 Total Score  0     Lumbar xray: DDD. No acute findings. Uterine fibroid.     Assessment/plan: 1. Hypertension associated with diabetes ([place]) Extremely elevated and out of normal for her looking back in her chart. Will have her keep a log and f/u in one month with me. Precautions given.   2. Type 2 diabetes mellitus with other specified complication, without long-term current use of insulin ([place]) Routine lab work today. Has CKD stage 2 with elevated creatinine in the past. Will see how she is doing then decide about metformin-she only takes the 3 days/week . On statin, was going to give pneumonia shot but getting covid vaccine so will wait.  - CBC with Differential/Platelet - Comprehensive metabolic panel - Hemoglobin A1c - Microalbumin / creatinine urine ratio - TSH  3. Hyperlipidemia associated with type 2 diabetes mellitus ([place])  Continue statin.  - Lipid panel  4. Vitamin D deficiency  - VITAMIN D 25 Hydroxy (Vit-D Deficiency, Fractures)  5. Encounter for hepatitis C screening test for low risk patient  - Hepatitis C antibody  6. Menopausal and postmenopausal disorder  - DG Bone Density; Future  7. Chronic bilateral low back pain without sciatica Check xray/labs. Not interested in PT as she has done this in the past. Will see what work up shows then likely refer to sport medicine to see if they can offer her anything.  - DG Lumbar Spine Complete; Future  8. Arthralgia, unspecified joint Tells me she has had rheumatoid arthritis in the past and saw a rheumatologist and this went into remission. Will check RF today.   - Rheumatoid factor  Asked that she look for colonoscopy records and tdap records.   Total time of encounter: 1045 -1128 55 minutes total time of encounter, including 40 minutes spent in face-to-face patient  care. This time includes coordination of care and counseling regarding reviewing chart, multiple medical problems and complaints today and work up of chronic and acute problems. Remainder of non-face-to-face time involved reviewing chart documents/testing relevant to the patient encounter and documentation in the medical record.  This visit occurred during the SARS-CoV-2 public health emergency.  Safety protocols were in place, including screening questions prior to the visit, additional usage of staff PPE, and extensive cleaning of exam room while observing appropriate contact time as indicated for disinfecting solutions.     Return in about 1 month (around 05/14/2019) for blood pressure recheck .   Orma Flaming, MD [place]   04/16/2019

## 2019-04-19 ENCOUNTER — Other Ambulatory Visit: Payer: Self-pay | Admitting: Family Medicine

## 2019-04-19 LAB — RHEUMATOID FACTOR: Rheumatoid fact SerPl-aCnc: <14 IU/mL (ref <14)

## 2019-04-19 LAB — HEPATITIS C ANTIBODY
Hepatitis C Ab: NONREACTIVE
SIGNAL TO CUT-OFF: 0.01 (ref <1.00)

## 2019-04-19 MED ORDER — FERROUS GLUCONATE 324 (38 FE) MG PO TABS: 324.0000 mg | ORAL_TABLET | Freq: Two times a day (BID) | ORAL | 3 refills | Status: DC

## 2019-04-19 MED ORDER — VITAMIN D (ERGOCALCIFEROL) 1.25 MG (50000 UNIT) PO CAPS: ORAL_CAPSULE | ORAL | 0 refills | Status: DC

## 2019-04-28 ENCOUNTER — Telehealth: Payer: Self-pay | Admitting: Family Medicine

## 2019-04-28 NOTE — Telephone Encounter (Signed)
Noted.  Orma Flaming, MD [place]
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
## 2019-05-11 ENCOUNTER — Telehealth: Payer: Self-pay

## 2019-05-11 NOTE — Telephone Encounter (Signed)
Patient experiencing sore neck and back. Patient would like to know could you recommend something for her to take or use before there schedule appt 05/14/19 to reduce soreness.

## 2019-05-11 NOTE — Telephone Encounter (Signed)
Spoke with Pt to triage for neck pain symptoms. Pt says she cannot turn her neck. She says that she is sore all over from the back of her neck around to the bottom of her chin. I recommended OTC ibuprofen and ice packs/and or gel pack until her appointment on Friday. Pt voiced understanding.

## 2019-05-14 ENCOUNTER — Encounter: Payer: Self-pay | Admitting: Family Medicine

## 2019-05-14 ENCOUNTER — Ambulatory Visit (INDEPENDENT_AMBULATORY_CARE_PROVIDER_SITE_OTHER): Payer: Medicare HMO | Admitting: Family Medicine

## 2019-05-14 ENCOUNTER — Other Ambulatory Visit: Payer: Self-pay

## 2019-05-14 VITALS — BP 168/80 | HR 72 | Temp 98.6°F | Ht 63.0 in | Wt 141.6 lb

## 2019-05-14 DIAGNOSIS — I152 Hypertension secondary to endocrine disorders: Secondary | ICD-10-CM

## 2019-05-14 DIAGNOSIS — M542 Cervicalgia: Secondary | ICD-10-CM | POA: Diagnosis not present

## 2019-05-14 DIAGNOSIS — I10 Essential (primary) hypertension: Secondary | ICD-10-CM | POA: Diagnosis not present

## 2019-05-14 DIAGNOSIS — E1159 Type 2 diabetes mellitus with other circulatory complications: Secondary | ICD-10-CM

## 2019-05-14 MED ORDER — CYCLOBENZAPRINE HCL 5 MG PO TABS: 5.0000 mg | ORAL_TABLET | Freq: Three times a day (TID) | ORAL | 1 refills | Status: DC | PRN

## 2019-05-14 MED ORDER — LISINOPRIL 20 MG PO TABS: 20.0000 mg | ORAL_TABLET | Freq: Every day | ORAL | 0 refills | Status: DC
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
## 2019-05-14 NOTE — Patient Instructions (Addendum)
For blood pressure- Starting you on lisinopril 20mg /day. You can start with 1/2 tab x 1 week then go up to a full tab. Continue with your blood pressure log for me and bring to next appointment. Call if any issues. Any swelling of lips, go to ER. Let me know if dry cough.   For neck muscle strain: continue voltaren gel, tylenol and ice pack. Sending in muscle relaxer to take as needed. May make drowsy.    Cervical Strain and Sprain Rehab Ask your health care provider which exercises are safe for you. Do exercises exactly as told by your health care provider and adjust them as directed. It is normal to feel mild stretching, pulling, tightness, or discomfort as you do these exercises. Stop right away if you feel sudden pain or your pain gets worse. Do not begin these exercises until told by your health care provider. Stretching and range-of-motion exercises Cervical side bending  1. Using good posture, sit on a stable chair or stand up. 2. Without moving your shoulders, slowly tilt your left / right ear to your shoulder until you feel a stretch in the opposite side neck muscles. You should be looking straight ahead. 3. Hold for __________ seconds. 4. Repeat with the other side of your neck. Repeat __________ times. Complete this exercise __________ times a day. Cervical rotation  1. Using good posture, sit on a stable chair or stand up. 2. Slowly turn your head to the side as if you are looking over your left / right shoulder. ? Keep your eyes level with the ground. ? Stop when you feel a stretch along the side and the back of your neck. 3. Hold for __________ seconds. 4. Repeat this by turning to your other side. Repeat __________ times. Complete this exercise __________ times a day. Thoracic extension and pectoral stretch 1. Roll a towel or a small blanket so it is about 4 inches (10 cm) in diameter. 2. Lie down on your back on a firm surface. 3. Put the towel lengthwise, under your spine  in the middle of your back. It should not be under your shoulder blades. The towel should line up with your spine from your middle back to your lower back. 4. Put your hands behind your head and let your elbows fall out to your sides. 5. Hold for __________ seconds. Repeat __________ times. Complete this exercise __________ times a day. Strengthening exercises Isometric upper cervical flexion 1. Lie on your back with a thin pillow behind your head and a small rolled-up towel under your neck. 2. Gently tuck your chin toward your chest and nod your head down to look toward your feet. Do not lift your head off the pillow. 3. Hold for __________ seconds. 4. Release the tension slowly. Relax your neck muscles completely before you repeat this exercise. Repeat __________ times. Complete this exercise __________ times a day. Isometric cervical extension  1. Stand about 6 inches (15 cm) away from a wall, with your back facing the wall. 2. Place a soft object, about 6-8 inches (15-20 cm) in diameter, between the back of your head and the wall. A soft object could be a small pillow, a ball, or a folded towel. 3. Gently tilt your head back and press into the soft object. Keep your jaw and forehead relaxed. 4. Hold for __________ seconds. 5. Release the tension slowly. Relax your neck muscles completely before you repeat this exercise. Repeat __________ times. Complete this exercise __________ times a day.  Posture and body mechanics Body mechanics refers to the movements and positions of your body while you do your daily activities. Posture is part of body mechanics. Good posture and healthy body mechanics can help to relieve stress in your body's tissues and joints. Good posture means that your spine is in its natural S-curve position (your spine is neutral), your shoulders are pulled back slightly, and your head is not tipped forward. The following are general guidelines for applying improved posture and  body mechanics to your everyday activities. Sitting  1. When sitting, keep your spine neutral and keep your feet flat on the floor. Use a footrest, if necessary, and keep your thighs parallel to the floor. Avoid rounding your shoulders, and avoid tilting your head forward. 2. When working at a desk or a computer, keep your desk at a height where your hands are slightly lower than your elbows. Slide your chair under your desk so you are close enough to maintain good posture. 3. When working at a computer, place your monitor at a height where you are looking straight ahead and you do not have to tilt your head forward or downward to look at the screen. Standing   When standing, keep your spine neutral and keep your feet about hip-width apart. Keep a slight bend in your knees. Your ears, shoulders, and hips should line up.  When you do a task in which you stand in one place for a long time, place one foot up on a stable object that is 2-4 inches (5-10 cm) high, such as a footstool. This helps keep your spine neutral. Resting When lying down and resting, avoid positions that are most painful for you. Try to support your neck in a neutral position. You can use a contour pillow or a small rolled-up towel. Your pillow should support your neck but not push on it. This information is not intended to replace advice given to you by your health care provider. Make sure you discuss any questions you have with your health care provider. Document Revised: 06/10/2018 Document Reviewed: 11/19/2017 Elsevier Patient Education  [place].   Homemade gel ice packs These homemade gel ice packs are more comfortable than a bag of frozen peas, because they mold better to your body without the lumps and bumps. They can be made for under $3.  What you need: . 1 quart or 1 gallon plastic freezer bags (depending on how large you want the cold pack)  . 2 cups water  . 1 cup rubbing  alcohol Instructions: 1. Fill the plastic freezer bag with 1 cup of rubbing alcohol and 2 cups of water. 2. Try to get as much air out of the freezer bag before sealing it shut. 3. Place the bag and its contents inside a second freezer bag to contain any leakage. 4. Leave the bag in the freezer for at least an hour. 5. When it's ready, place a towel between the gel pack and bare skin to avoid burning the skin. An alternative filler is simply to use dish soap, which has a gel-like consistency and will also freeze/retain the cold.

## 2019-05-18 ENCOUNTER — Telehealth: Payer: Self-pay | Admitting: Family Medicine

## 2019-05-18 NOTE — Telephone Encounter (Signed)
Called patient recommended increase water, start stool softer and increase fiber in diet. Will call if any issues.   She also had several questions about ongoing back pain. History of surgery. Recommended she get in touch with neurosurgery and make follow up appointment.

## 2019-05-18 NOTE — Telephone Encounter (Signed)
Pt called asking if CMA could give her a call. Pt is having trouble with bowel movements and wanted to be reminded of the recommendation Dr. Rogers Blocker gave her on something she could take to help with her bowels. Please advise.

## 2019-05-21 ENCOUNTER — Ambulatory Visit
Admission: RE | Admit: 2019-05-21 | Discharge: 2019-05-21 | Disposition: A | Discharge status: Home / Self Care | Payer: Medicare HMO | Source: Ambulatory Visit | Attending: Family Medicine | Admitting: Family Medicine

## 2019-05-21 ENCOUNTER — Other Ambulatory Visit: Payer: Self-pay

## 2019-05-21 ENCOUNTER — Telehealth: Payer: Self-pay | Admitting: Family Medicine

## 2019-05-21 DIAGNOSIS — Z780 Asymptomatic menopausal state: Secondary | ICD-10-CM | POA: Diagnosis not present

## 2019-05-21 NOTE — Telephone Encounter (Signed)
FYI

## 2019-05-21 NOTE — Telephone Encounter (Signed)
Patient is wanting to let Dr.Wolfe know her last colonoscopy was done on 12/02/16

## 2019-05-24 NOTE — Telephone Encounter (Signed)
Added to HM.  Orma Flaming, MD [place]

## 2019-05-27 ENCOUNTER — Telehealth: Payer: Self-pay | Admitting: Family Medicine

## 2019-05-27 NOTE — Telephone Encounter (Signed)
Patient states she was referred to Dr. Marvel Plan at [facility] for fibroids.  States notes need to be faxed over in regard.

## 2019-05-27 NOTE — Telephone Encounter (Signed)
I don't have a referral in for her.  I am forwarding this to [facility].  As soon as it is placed, I will get it sent.

## 2019-05-27 NOTE — Telephone Encounter (Signed)
Please let her know that I did not do a referral for her regarding this. Im not sure what she is talking about? Can we clarify this?  Thanks,  Dr. Rogers Blocker

## 2019-05-27 NOTE — Telephone Encounter (Signed)
LVM for patient to call the office back. 

## 2019-05-28 NOTE — Telephone Encounter (Signed)
Patient is returning Melitta's call, and states she is going to be free today and asked for a returned call when she is available.

## 2019-05-28 NOTE — Telephone Encounter (Signed)
Also patient never responded to her mychart message regarding her lab results. Had melitta call her back ot address this. I discussed in that note that fibroid measuring 6.2cm was seen on xray and that COULD be contributing to her anemia and that she should f/u with her gyn. No referral was placed.  Orma Flaming, MD [place]

## 2019-05-28 NOTE — Telephone Encounter (Signed)
Spoke with patient to clarify referral for OBGYN? Pt says that she has called to follow up with them.

## 2019-06-03 ENCOUNTER — Telehealth: Payer: Self-pay

## 2019-06-03 NOTE — Telephone Encounter (Signed)
Pending approval for Cyclobenzaprine 5 mg tablet Key: [id number] Rx #: [id number] PA Case ID: [id number]   I will update once a decision has been made.

## 2019-06-07 NOTE — Telephone Encounter (Addendum)
Received an approval for Cyclobenzaprine 5 mg tablet. Medication has been approved through 09-01-2019. Copy of approval letter has been faxed to the patient's pharmacy. Patient has been notified via [employer]. Confirmation fax has been received.

## 2019-06-17 ENCOUNTER — Telehealth: Payer: Self-pay | Admitting: Family Medicine

## 2019-06-17 DIAGNOSIS — D259 Leiomyoma of uterus, unspecified: Secondary | ICD-10-CM | POA: Diagnosis not present

## 2019-06-17 NOTE — Telephone Encounter (Signed)
Pt began taking Flexeril and Lisinopril 2 days ago. Pt states she has developed some dizziness and wanted to know if taking the 2 medications together could cause this. Please advise.

## 2019-06-17 NOTE — Telephone Encounter (Signed)
I spoke with the patient to give message below. Pt says that she normally takes the lisinopril before going to bed, so she does not feel the effects of it. She also states that her B/P has been up and down, but mostly down. She did not have her log with her. She agrees to stopping the Flexeril.

## 2019-06-17 NOTE — Telephone Encounter (Signed)
Yes.. the flexeril can make her dizzy and drowsy. I would stop this and see how she feels on the lisinopril only. What is her blood pressure running?  Dr. Rogers Blocker

## 2019-06-17 NOTE — Telephone Encounter (Signed)
error 

## 2019-07-01 ENCOUNTER — Telehealth: Payer: Self-pay | Admitting: Family Medicine

## 2019-07-01 DIAGNOSIS — K5909 Other constipation: Secondary | ICD-10-CM

## 2019-07-01 NOTE — Telephone Encounter (Signed)
Patient is calling in and wanting to know if Dr.Wolfe will refer her to a GI doctor due to recurring problem going to the bathroom, states she was told to take over the counter stool softener but it still is not helping, tried offering an appointment but patient states she has already spoken to [facility] about this issue in the past.

## 2019-07-01 NOTE — Telephone Encounter (Signed)
Lvm for pt to call the office back. 

## 2019-07-01 NOTE — Telephone Encounter (Signed)
Please advise 

## 2019-07-01 NOTE — Telephone Encounter (Signed)
Let her know that we have talked about so much that I don't have anything documented regarding constipation. I have no problem doing a GI referral, but has she tried miralax daily to help as well? I also recommend citrucel.  Dr. Rogers Blocker

## 2019-07-02 ENCOUNTER — Encounter: Payer: Self-pay | Admitting: Physician Assistant

## 2019-07-02 NOTE — Telephone Encounter (Signed)
I spoke with Pt about message below. Pt has tried Miralax, She says that [facility] recommended Colace. She also says if she doesn't take tea at night she won't have stools. Pt is using Extra strength [place] dieters drink (no caffeine) herbal tea. She says that she has had this conversation with Dr. Rogers Blocker before, and that she has never heard of colace. She mentions that it sounds like citrucel is something she has already used. Pt was told that the referral for GI will be placed.

## 2019-07-02 NOTE — Telephone Encounter (Signed)
Patient returning missed call. 

## 2019-07-02 NOTE — Telephone Encounter (Signed)
She has never had this conversation with me. Appears to be a log note from CMA-joellen, regarding constipation. Regardless, I will put in GI referral.  Orma Flaming, MD [place]

## 2019-07-02 NOTE — Addendum Note (Signed)
Addended by: Orma Flaming on: 07/02/2019 11:40 AM   Modules accepted: Orders

## 2019-07-09 ENCOUNTER — Other Ambulatory Visit: Payer: Self-pay | Admitting: Family Medicine

## 2019-07-15 ENCOUNTER — Other Ambulatory Visit: Payer: Self-pay | Admitting: Family Medicine

## 2019-07-15 ENCOUNTER — Ambulatory Visit: Payer: Medicare HMO | Admitting: Physician Assistant

## 2019-07-15 ENCOUNTER — Encounter: Payer: Self-pay | Admitting: Physician Assistant

## 2019-07-15 VITALS — BP 104/64 | HR 74 | Temp 97.6°F | Ht 63.0 in | Wt 142.0 lb

## 2019-07-15 DIAGNOSIS — K5900 Constipation, unspecified: Secondary | ICD-10-CM | POA: Diagnosis not present

## 2019-07-15 DIAGNOSIS — R194 Change in bowel habit: Secondary | ICD-10-CM

## 2019-07-15 MED ORDER — NA SULFATE-K SULFATE-MG SULF 17.5-3.13-1.6 GM/177ML PO SOLN
1.0000 | Freq: Once | ORAL | 0 refills | Status: AC
Start: 2019-07-15 — End: 2019-07-15

## 2019-07-15 NOTE — Patient Instructions (Addendum)
If you are age 69 or older, your body mass index should be between 23-30. Your Body mass index is 25.15 kg/m. If this is out of the aforementioned range listed, please consider follow up with your Primary Care Provider.  If you are age 73 or younger, your body mass index should be between 19-25. Your Body mass index is 25.15 kg/m. If this is out of the aformentioned range listed, please consider follow up with your Primary Care Provider.   You have been scheduled for a colonoscopy. Please follow written instructions given to you at your visit today.  Please pick up your prep supplies at the pharmacy within the next 1-3 days. If you use inhalers (even only as needed), please bring them with you on the day of your procedure.  Due to recent changes in healthcare laws, you may see the results of your imaging and laboratory studies on MyChart before your provider has had a chance to review them.  We understand that in some cases there may be results that are confusing or concerning to you. Not all laboratory results come back in the same time frame and the provider may be waiting for multiple results in order to interpret others.  Please give [place] 48 hours in order for your provider to thoroughly review all the results before contacting the office for clarification of your results.   Follow up in clinic per recommendations from Dr. Rush Landmark after procedure.

## 2019-07-15 NOTE — Progress Notes (Signed)
Chief Complaint: Constipation  HPI:    Kristie King is a 69 year old female with a past medical history as listed below including CKD stage II, hypertension, osteoarthritis and others, who was referred to me by Orma Flaming, MD for a complaint of constipation.      12/02/2016 it looks like patient was seen by Dr. Michail Sermon for an office visit.  We do not have records.    Today, the patient tells me that about 3 years ago she started having to take laxatives in order to have a bowel movement.  Currently drinks a senna tea every night and if she does not then she will not have a bowel movement.  If she does use this tea every night she has a daily bowel movement and has no discomfort, if she does not use it she can go 3 or more days before she will use it again in order to have a bowel movement.  Tells me that she did not really think much about this, but was recently thinking this may not be normal.  Also tried stool softeners and MiraLAX in the past which did not have quite the same result.  Reports her last colonoscopy was almost 10 years ago?    Denies fever, chills, weight loss, blood in her stools or abdominal pain.  Past Medical History:  Diagnosis Date  . AC (acromioclavicular) arthritis 11/13/2016  . Cervical radiculopathy at C6 09/10/2016  . CKD stage 2 due to type 2 diabetes mellitus ([place]) 04/14/2018  . Diabetes mellitus without complication ([place])   . History of lumbar laminectomy 2019  . HTN (hypertension) 09/17/2012  . Hyperlipidemia associated with type 2 diabetes mellitus ([place]) 09/17/2012  . Hypertension associated with diabetes ([place]) 04/14/2018  . Iron deficiency anemia 10/16/2016  . Osteoarthritis of spine with radiculopathy, cervical region 08/03/2016  . Vitamin D deficiency 10/16/2016    Past Surgical History:  Procedure Laterality Date  . BACK SURGERY  03/30/2018  . BUNIONECTOMY Right 02/18/2013  . BUNIONECTOMY Left 04/01/2013    Current Outpatient Medications  Medication Sig  Dispense Refill  . atorvastatin (LIPITOR) 20 MG tablet Take 1 tablet (20 mg total) by mouth daily at 6 PM. 90 tablet 0  . cyclobenzaprine (FLEXERIL) 5 MG tablet Take 1 tablet (5 mg total) by mouth 3 (three) times daily as needed for muscle spasms. 30 tablet 1  . ferrous gluconate (FERGON) 324 MG tablet Take 1 tablet (324 mg total) by mouth 2 (two) times daily with a meal. 60 tablet 3  . furosemide (LASIX) 40 MG tablet Take 1 tablet (40 mg total) by mouth daily. 90 tablet 2  . lisinopril (ZESTRIL) 20 MG tablet Take 1 tablet (20 mg total) by mouth daily. 90 tablet 0  . metFORMIN (GLUCOPHAGE) 1000 MG tablet Take 1 tablet (1,000 mg total) by mouth daily with breakfast. Mon, wed fri 90 tablet 1  . TRULICITY 1.5 0000000 SOPN INJECT 0.5 MLS INTO THE SKIN ONCE A WEEK. 4 pen 3  . Vitamin D, Ergocalciferol, (DRISDOL) 1.25 MG (50000 UNIT) CAPS capsule One capsule by mouth once a week for 12 weeks. Then take 2000IU/day 12 capsule 0   No current facility-administered medications for this visit.    Allergies as of 07/15/2019  . (No Known Allergies)    Family History  Problem Relation Age of Onset  . Cancer Mother   . Stroke Father   . Hyperlipidemia Sister   . Diabetes Brother   . Hypertension Brother   .  Diabetes Sister   . Hypertension Sister   . Stroke Maternal Grandfather     Social History   Socioeconomic History  . Marital status: Married    Spouse name: Not on file  . Number of children: 0  . Years of education: Not on file  . Highest education level: Not on file  Occupational History  . Occupation: Retired    Fish farm manager: [employer]  Tobacco Use  . Smoking status: Former Smoker    Years: 10.00    Types: Cigarettes    Quit date: 01/15/1971    Years since quitting: 48.5  . Smokeless tobacco: Never Used  Substance and Sexual Activity  . Alcohol use: Yes    Comment: 09/17/2012 "drink on special occasions only"  . Drug use: No  . Sexual activity: Yes    Partners: Male  Other Topics  Concern  . Not on file  Social History Narrative   Increased stress with impending death of brother whom lives in [place]    Social Determinants of Health   Financial Resource Strain:   . Difficulty of Paying Living Expenses:   Food Insecurity:   . Worried About Charity fundraiser in the Last Year:   . Arboriculturist in the Last Year:   Transportation Needs:   . Film/video editor (Medical):   Marland Kitchen Lack of Transportation (Non-Medical):   Physical Activity:   . Days of Exercise per Week:   . Minutes of Exercise per Session:   Stress:   . Feeling of Stress :   Social Connections:   . Frequency of Communication with Friends and Family:   . Frequency of Social Gatherings with Friends and Family:   . Attends Religious Services:   . Active Member of Clubs or Organizations:   . Attends Archivist Meetings:   Marland Kitchen Marital Status:   Intimate Partner Violence:   . Fear of Current or Ex-Partner:   . Emotionally Abused:   Marland Kitchen Physically Abused:   . Sexually Abused:     Review of Systems:    Constitutional: No weight loss, fever or chills Skin: No rash  Cardiovascular: No chest pain Respiratory: No SOB  Gastrointestinal: See HPI and otherwise negative Genitourinary: No dysuria Neurological: No headache, dizziness or syncope Musculoskeletal: No new muscle or joint pain Hematologic: No bleeding  Psychiatric: No history of depression or anxiety   Physical Exam:  Vital signs: BP 104/64   Pulse 74   Temp 97.6 F (36.4 C)   Ht 5\' 3"  (1.6 m)   Wt 142 lb (64.4 kg)   BMI 25.15 kg/m   Constitutional:   Very Pleasant AA female appears to be in NAD, Well developed, Well nourished, alert and cooperative Head:  Normocephalic and atraumatic. Eyes:   PEERL, EOMI. No icterus. Conjunctiva pink. Ears:  Normal auditory acuity. Neck:  Supple Throat: Oral cavity and pharynx without inflammation, swelling or lesion.  Respiratory: Respirations even and unlabored. Lungs clear to  auscultation bilaterally.   No wheezes, crackles, or rhonchi.  Cardiovascular: Normal S1, S2. No MRG. Regular rate and rhythm. No peripheral edema, cyanosis or pallor.  Gastrointestinal:  Soft, nondistended, nontender. No rebound or guarding. Normal bowel sounds. No appreciable masses or hepatomegaly. Rectal:  Not performed.  Msk:  Symmetrical without gross deformities. Without edema, no deformity or joint abnormality.  Neurologic:  Alert and  oriented x4;  grossly normal neurologically.  Skin:   Dry and intact without significant lesions or rashes. Psychiatric: Demonstrates good  judgement and reason without abnormal affect or behaviors.  RELEVANT LABS AND IMAGING: CBC    Component Value Date/Time   WBC 6.5 04/16/2019 1127   RBC 3.69 (L) 04/16/2019 1127   HGB 10.6 Repeated and verified X2. (L) 04/16/2019 1127   HCT 32.8 (L) 04/16/2019 1127   PLT 262.0 04/16/2019 1127   MCV 89.0 04/16/2019 1127   MCH 28.6 08/25/2013 1830   MCHC 32.4 04/16/2019 1127   RDW 13.8 04/16/2019 1127   LYMPHSABS 2.1 04/16/2019 1127   MONOABS 0.7 04/16/2019 1127   EOSABS 0.2 04/16/2019 1127   BASOSABS 0.0 04/16/2019 1127    CMP     Component Value Date/Time   NA 142 04/16/2019 1127   K 3.9 04/16/2019 1127   CL 106 04/16/2019 1127   CO2 30 04/16/2019 1127   GLUCOSE 81 04/16/2019 1127   BUN 13 04/16/2019 1127   CREATININE 0.81 04/16/2019 1127   CREATININE 1.16 (H) 08/25/2013 1830   CALCIUM 9.8 04/16/2019 1127   PROT 6.9 04/16/2019 1127   ALBUMIN 4.2 04/16/2019 1127   AST 17 04/16/2019 1127   ALT 6 04/16/2019 1127   ALKPHOS 52 04/16/2019 1127   BILITOT 0.7 04/16/2019 1127   GFRNONAA 38 (L) 09/18/2012 0345   GFRAA 44 (L) 09/18/2012 0345    Assessment: 1.  Change in bowel habits: Towards constipation over the past 3 years, cannot have a bowel movement without senna tea; consider organic obstruction versus polyp versus age-related change versus other 2.  Constipation  Plan: 1.  Scheduled patient  for diagnostic colonoscopy in the [place] with Dr. Rush Landmark given her change in bowel habits.  Did discuss risks, benefits, limitations and alternatives and the patient agrees to proceed. 2.  Patient can continue senna tea for now.  We did discuss stopping this and starting MiraLAX or some other laxative but the patient would prefer not to if it is working. 3.  Patient to follow in clinic per recommendations from Dr. Rush Landmark after time of procedure.  Ellouise Newer, PA-C [facility] 07/15/2019, 9:54 AM  Cc: Orma Flaming, MD

## 2019-07-15 NOTE — Telephone Encounter (Signed)
Last OV 05/14/19 Last refill 04/19/19 #60/3 Next OV 07/16/19

## 2019-07-16 ENCOUNTER — Encounter: Payer: Self-pay | Admitting: Family Medicine

## 2019-07-16 ENCOUNTER — Ambulatory Visit (INDEPENDENT_AMBULATORY_CARE_PROVIDER_SITE_OTHER): Payer: Medicare HMO | Admitting: Family Medicine

## 2019-07-16 ENCOUNTER — Other Ambulatory Visit: Payer: Self-pay

## 2019-07-16 VITALS — BP 148/48 | HR 71 | Temp 97.6°F | Ht 63.0 in | Wt 144.4 lb

## 2019-07-16 DIAGNOSIS — D508 Other iron deficiency anemias: Secondary | ICD-10-CM | POA: Diagnosis not present

## 2019-07-16 DIAGNOSIS — I10 Essential (primary) hypertension: Secondary | ICD-10-CM

## 2019-07-16 DIAGNOSIS — G8929 Other chronic pain: Secondary | ICD-10-CM

## 2019-07-16 DIAGNOSIS — M545 Low back pain: Secondary | ICD-10-CM

## 2019-07-16 DIAGNOSIS — F5101 Primary insomnia: Secondary | ICD-10-CM

## 2019-07-16 DIAGNOSIS — I152 Hypertension secondary to endocrine disorders: Secondary | ICD-10-CM

## 2019-07-16 DIAGNOSIS — E1159 Type 2 diabetes mellitus with other circulatory complications: Secondary | ICD-10-CM

## 2019-07-16 DIAGNOSIS — R69 Illness, unspecified: Secondary | ICD-10-CM | POA: Diagnosis not present

## 2019-07-16 LAB — CBC WITH DIFFERENTIAL/PLATELET
Basophils Absolute: 0 10*3/uL (ref 0.0–0.1)
Basophils Relative: 0.3 % (ref 0.0–3.0)
Eosinophils Absolute: 0.2 10*3/uL (ref 0.0–0.7)
Eosinophils Relative: 2.7 % (ref 0.0–5.0)
HCT: 33.6 % — ABNORMAL LOW (ref 36.0–46.0)
Hemoglobin: 11.2 g/dL — ABNORMAL LOW (ref 12.0–15.0)
Lymphocytes Relative: 39.4 % (ref 12.0–46.0)
Lymphs Abs: 2.2 10*3/uL (ref 0.7–4.0)
MCHC: 33.3 g/dL (ref 30.0–36.0)
MCV: 88.9 fl (ref 78.0–100.0)
Monocytes Absolute: 0.7 10*3/uL (ref 0.1–1.0)
Monocytes Relative: 12.3 % — ABNORMAL HIGH (ref 3.0–12.0)
Neutro Abs: 2.5 10*3/uL (ref 1.4–7.7)
Neutrophils Relative %: 45.3 % (ref 43.0–77.0)
Platelets: 260 10*3/uL (ref 150.0–400.0)
RBC: 3.78 Mil/uL — ABNORMAL LOW (ref 3.87–5.11)
RDW: 14.4 % (ref 11.5–15.5)
WBC: 5.6 10*3/uL (ref 4.0–10.5)

## 2019-07-16 MED ORDER — TIZANIDINE HCL 2 MG PO CAPS: 2.0000 mg | ORAL_CAPSULE | Freq: Three times a day (TID) | ORAL | 1 refills | Status: DC

## 2019-07-16 MED ORDER — TRAZODONE HCL 50 MG PO TABS: 25.0000 mg | ORAL_TABLET | Freq: Every evening | ORAL | 1 refills | Status: DC | PRN

## 2019-07-16 NOTE — Progress Notes (Signed)
Patient: Kristie King MRN: [id number] DOB: 03-07-1950 PCP: Orma Flaming, MD     Subjective:  Chief Complaint  Patient presents with  . Hypertension    2 months  . Insomnia    Pt is requesting Trazadone, she was prescribed this by Juleen China.    HPI: The patient is a 69 y.o. female who presents today for Hypertension. Pt says that it has been improving due to the lisinopril. She says that she is pleased with it. She complains of insomnia.  Hypertension: Here for follow up of hypertension.  Currently on lisinopril 20mg .  Home readings range from [id number]- [id number] diastolic. Takes medication as prescribed and denies any side effects. Exercise includes walking/strenghtening. Weight has been stable. Denies any chest pain, headaches, shortness of breath, vision changes, swelling in lower extremities.   Insomnia She is on trazodone prn and is requesting a refill. This works well for her and she is requesting a refill.   She has some questions regarding her upcoming her colonoscopy.   Anemia I put her on iron twice a day. She has repeat CBC today. I have likely made her constipation worse with this. She is taking this as she should. She does have an upcoming colonoscopy. No family history of sickle cell or other blood disorders. She denies any vaginal bleeding. Does have a large, but stable fibroid.    Review of Systems  Respiratory: Negative for cough, shortness of breath and wheezing.   Cardiovascular: Negative for chest pain and palpitations.  Gastrointestinal: Negative for abdominal pain, nausea and vomiting.  Neurological: Negative for dizziness, light-headedness and headaches.    Allergies Patient has No Known Allergies.  Past Medical History Patient  has a past medical history of AC (acromioclavicular) arthritis (11/13/2016), Cervical radiculopathy at C6 (09/10/2016), CKD stage 2 due to type 2 diabetes mellitus ([place]) (04/14/2018), Diabetes mellitus without complication ([facility]),  History of lumbar laminectomy (2019), HTN (hypertension) (09/17/2012), Hyperlipidemia associated with type 2 diabetes mellitus ([place]) (09/17/2012), Hypertension associated with diabetes ([place]) (04/14/2018), Iron deficiency anemia (10/16/2016), Osteoarthritis of spine with radiculopathy, cervical region (08/03/2016), and Vitamin D deficiency (10/16/2016).  Surgical History Patient  has a past surgical history that includes Bunionectomy (Right, 02/18/2013); Bunionectomy (Left, 04/01/2013); and Back surgery (03/30/2018).  Family History [name]'s family history includes Diabetes in her brother and sister; Hyperlipidemia in her sister; Hypertension in her brother and sister; Stroke in her father, maternal grandfather, and mother.  Social History Patient  reports that she quit smoking about 48 years ago. Her smoking use included cigarettes. She quit after 10.00 years of use. She has never used smokeless tobacco. She reports current alcohol use. She reports that she does not use drugs.    Objective: Vitals:   07/16/19 1059 07/16/19 1238  BP: 110/70 (!) 148/48  Pulse: 71   Temp: 97.6 F (36.4 C)   TempSrc: Temporal   SpO2: 97%   Weight: 144 lb 6.4 oz (65.5 kg)   Height: 5\' 3"  (1.6 m)     Body mass index is 25.58 kg/m.  Physical Exam Vitals reviewed.  Constitutional:      Appearance: Normal appearance.  HENT:     Head: Normocephalic and atraumatic.  Cardiovascular:     Rate and Rhythm: Normal rate and regular rhythm.     Heart sounds: Normal heart sounds.  Pulmonary:     Effort: Pulmonary effort is normal.     Breath sounds: Normal breath sounds.  Abdominal:     General: Abdomen is flat. Bowel sounds  are normal.     Palpations: Abdomen is soft.  Neurological:     General: No focal deficit present.     Mental Status: She is alert and oriented to person, place, and time.  Psychiatric:        Mood and Affect: Mood normal.        Behavior: Behavior normal.        Assessment/plan: 1.  Hypertension associated with diabetes ([place]) Much better and near to goal. Continue lisinopril 20mg . Will have her f/u in 3 months for routine diabetic appointment and labs.   2. Other iron deficiency anemia Has upcoming cscope. She may do better with iv iron infusions since constipation is already and issue and the oral iron is only making this worse. Repeating iron studies and cbc. If still low, will refer to heme.  - CBC with Differential/Platelet - Iron, TIBC and Ferritin Panel  3. Chronic bilateral low back pain without sciatica Did not like flexeril, made her dizzy. Trial of tizanidine, low dose and referral to PT.  - Ambulatory referral to Physical Therapy  4. Primary insomnia Refilled trazodone. Continue sleep hygiene.    This visit occurred during the SARS-CoV-2 public health emergency.  Safety protocols were in place, including screening questions prior to the visit, additional usage of staff PPE, and extensive cleaning of exam room while observing appropriate contact time as indicated for disinfecting solutions.    Return in about 3 months (around 10/16/2019) for routine diabetic follow up. Orma Flaming, MD [place]   07/16/2019

## 2019-07-16 NOTE — Patient Instructions (Addendum)
1) referral to PT for back. Sent in new muscle relaxer called tizanidine. Let me know if any issues.   2) blood pressure is much better! Keep up the 20mg .   3) repeat cbc today. The iron can make your constipation worse. If still quite anemic, i'll send you to the blood doctor and they can talk about iron infusions with you.   4) sent in trazodone for you.   See you in 3 months ! Have a wonderful weekend.  Dr. Rogers Blocker

## 2019-07-17 LAB — IRON,TIBC AND FERRITIN PANEL
%SAT: 35 % (calc) (ref 16–45)
Ferritin: 120 ng/mL (ref 16–288)
Iron: 110 ug/dL (ref 45–160)
TIBC: 312 mcg/dL (calc) (ref 250–450)

## 2019-07-18 NOTE — Progress Notes (Signed)
Attending Physician's Attestation   I have reviewed the chart.   I agree with the Advanced Practitioner's note, impression, and recommendations with any updates as below.    Sueo Cullen Mansouraty, MD [facility] Advanced Endoscopy Office # [phone]  

## 2019-07-23 ENCOUNTER — Telehealth: Payer: Self-pay

## 2019-07-23 ENCOUNTER — Other Ambulatory Visit: Payer: Self-pay

## 2019-07-23 DIAGNOSIS — D508 Other iron deficiency anemias: Secondary | ICD-10-CM

## 2019-07-23 NOTE — Telephone Encounter (Signed)
Patient states that the tizanidine (ZANAFLEX) 2 MG capsule made her dizzy and she will no longer take this medication. Patient also states that she received a message from Dr. Rogers Blocker on my chart and was unable to reply. Patient states she would like a referral to see a haematologist.

## 2019-07-23 NOTE — Telephone Encounter (Signed)
Referral placed.  Orma Flaming, MD [place]

## 2019-07-26 ENCOUNTER — Telehealth: Payer: Self-pay | Admitting: Hematology and Oncology

## 2019-07-26 NOTE — Telephone Encounter (Signed)
Received a new hem referral from Dr. Rogers Blocker for IDA. Kristie King has been cld and scheduled to see Dr. Alvy Bimler on 5/27 at 1pm. Pt aware to arrive 15 minutes early.

## 2019-07-29 ENCOUNTER — Other Ambulatory Visit: Payer: Self-pay

## 2019-07-29 ENCOUNTER — Inpatient Hospital Stay: Payer: Medicare HMO | Attending: Hematology and Oncology | Admitting: Hematology and Oncology

## 2019-07-29 ENCOUNTER — Encounter: Payer: Self-pay | Admitting: Hematology and Oncology

## 2019-07-29 DIAGNOSIS — E1122 Type 2 diabetes mellitus with diabetic chronic kidney disease: Secondary | ICD-10-CM | POA: Diagnosis not present

## 2019-07-29 DIAGNOSIS — Z87891 Personal history of nicotine dependence: Secondary | ICD-10-CM | POA: Insufficient documentation

## 2019-07-29 DIAGNOSIS — D539 Nutritional anemia, unspecified: Secondary | ICD-10-CM | POA: Diagnosis not present

## 2019-07-29 DIAGNOSIS — N182 Chronic kidney disease, stage 2 (mild): Secondary | ICD-10-CM

## 2019-07-29 NOTE — Assessment & Plan Note (Addendum)
She has history of intermittent elevated creatinine She has diabetes for over 10 years Her hemoglobin A1c is borderline at 6.4% We discussed the importance of dietary modification and aggressive management of diabetes to avoid chronic kidney disease I plan to recheck her serum creatinine in her next visit to make sure that chronic kidney disease is not the contributing factor to her anemia

## 2019-07-29 NOTE — Progress Notes (Signed)
[place] NOTE  Patient Care Team: Orma Flaming, MD as PCP - General (Family Medicine) Lyndal Pulley, DO as Consulting Physician (Family Medicine) Vincente Liberty, MD as Consulting Physician (Pulmonary Disease) Monna Fam, MD as Consulting Physician (Ophthalmology) [facility], [facility] as Consulting Physician Zada Finders, Joyice Faster, MD as Consulting Physician (Neurosurgery) Clydell Hakim, MD as Consulting Physician (Anesthesiology) Lyndee Hensen, PT as Physical Therapist (Physical Therapy)  CHIEF COMPLAINTS/PURPOSE OF CONSULTATION:  Chronic anemia, for further follow-up  HISTORY OF PRESENTING ILLNESS:  Kristie King 69 y.o. female is here because of anemia  She was found to have abnormal CBC from CBC monitoring with her primary care doctor I have the opportunity to review his CBC dated back to 2014 Her hemoglobin typically range between 10.5-12.2 She denies recent chest pain on exertion, shortness of breath on minimal exertion, pre-syncopal episodes, or palpitations. She had not noticed any recent bleeding such as epistaxis, hematuria or hematochezia The patient denies over the counter NSAID ingestion. She is not on antiplatelets agents. Her last colonoscopy was in 2018 and it was reported as normal but she is scheduled for another repeat colonoscopy next month She had no prior history or diagnosis of cancer. Her age appropriate screening programs are up-to-date. She denies any pica and eats a variety of diet. She never donated blood or received blood transfusion The patient was prescribed oral iron supplements and she takes iron twice a day usually around her mealtime She has occasional nausea She have intermittent chronic constipation She has history of sarcoidosis but she does not take chronic prednisone therapy She has been very active since retirement She has gained almost 10 pounds since COVID-19 restriction and is looking forward to  exercising at the gym again  She was not aware of CT scan in 2019 which show abnormal stomach wall thickening  MEDICAL HISTORY:  Past Medical History:  Diagnosis Date  . AC (acromioclavicular) arthritis 11/13/2016  . Cervical radiculopathy at C6 09/10/2016  . CKD stage 2 due to type 2 diabetes mellitus ([place]) 04/14/2018  . Diabetes mellitus without complication ([place])   . History of lumbar laminectomy 2019  . HTN (hypertension) 09/17/2012  . Hyperlipidemia associated with type 2 diabetes mellitus ([place]) 09/17/2012  . Hypertension associated with diabetes ([place]) 04/14/2018  . Iron deficiency anemia 10/16/2016  . Osteoarthritis of spine with radiculopathy, cervical region 08/03/2016  . Vitamin D deficiency 10/16/2016    SURGICAL HISTORY: Past Surgical History:  Procedure Laterality Date  . BACK SURGERY  03/30/2018  . BUNIONECTOMY Right 02/18/2013  . BUNIONECTOMY Left 04/01/2013    SOCIAL HISTORY: Social History   Socioeconomic History  . Marital status: Married    Spouse name: Not on file  . Number of children: 0  . Years of education: Not on file  . Highest education level: Not on file  Occupational History  . Occupation: Retired    Fish farm manager: [employer]  Tobacco Use  . Smoking status: Former Smoker    Years: 10.00    Types: Cigarettes    Quit date: 01/15/1971    Years since quitting: 48.5  . Smokeless tobacco: Never Used  Substance and Sexual Activity  . Alcohol use: Yes    Comment: 09/17/2012 "drink on special occasions only"  . Drug use: No  . Sexual activity: Yes    Partners: Male  Other Topics Concern  . Not on file  Social History Narrative   Increased stress with impending death of brother whom lives in [place]  Social Determinants of Health   Financial Resource Strain:   . Difficulty of Paying Living Expenses:   Food Insecurity:   . Worried About Charity fundraiser in the Last Year:   . Arboriculturist in the Last Year:   Transportation Needs:   . Lexicographer (Medical):   Marland Kitchen Lack of Transportation (Non-Medical):   Physical Activity:   . Days of Exercise per Week:   . Minutes of Exercise per Session:   Stress:   . Feeling of Stress :   Social Connections:   . Frequency of Communication with Friends and Family:   . Frequency of Social Gatherings with Friends and Family:   . Attends Religious Services:   . Active Member of Clubs or Organizations:   . Attends Archivist Meetings:   Marland Kitchen Marital Status:   Intimate Partner Violence:   . Fear of Current or Ex-Partner:   . Emotionally Abused:   Marland Kitchen Physically Abused:   . Sexually Abused:     FAMILY HISTORY: Family History  Problem Relation Age of Onset  . Stroke Mother   . Stroke Father   . Hyperlipidemia Sister   . Diabetes Brother   . Hypertension Brother   . Diabetes Sister   . Hypertension Sister   . Stroke Maternal Grandfather   . Colon cancer Neg Hx   . Stomach cancer Neg Hx   . Esophageal cancer Neg Hx   . Pancreatic cancer Neg Hx     ALLERGIES:  has No Known Allergies.  MEDICATIONS:  Current Outpatient Medications  Medication Sig Dispense Refill  . atorvastatin (LIPITOR) 20 MG tablet Take 1 tablet (20 mg total) by mouth daily at 6 PM. 90 tablet 0  . ferrous gluconate (FERGON) 324 MG tablet TAKE 1 TABLET (324 MG TOTAL) BY MOUTH 2 (TWO) TIMES DAILY WITH A MEAL. 180 tablet 1  . furosemide (LASIX) 40 MG tablet Take 1 tablet (40 mg total) by mouth daily. 90 tablet 2  . lisinopril (ZESTRIL) 20 MG tablet Take 1 tablet (20 mg total) by mouth daily. 90 tablet 0  . metFORMIN (GLUCOPHAGE) 1000 MG tablet Take 1 tablet (1,000 mg total) by mouth daily with breakfast. Mon, wed fri 90 tablet 1  . OVER THE COUNTER MEDICATION [place] dieters laxative tea ( senna leaf and mellow tea )    . tizanidine (ZANAFLEX) 2 MG capsule Take 1 capsule (2 mg total) by mouth 3 (three) times daily. 30 capsule 1  . traZODone (DESYREL) 50 MG tablet Take 0.5-1 tablets (25-50 mg total) by  mouth at bedtime as needed for sleep. 90 tablet 1  . TRULICITY 1.5 0000000 SOPN INJECT 0.5 MLS INTO THE SKIN ONCE A WEEK. 4 pen 3  . Vitamin D, Ergocalciferol, (DRISDOL) 1.25 MG (50000 UNIT) CAPS capsule One capsule by mouth once a week for 12 weeks. Then take 2000IU/day 12 capsule 0   No current facility-administered medications for this visit.    REVIEW OF SYSTEMS:   Constitutional: Denies fevers, chills or abnormal night sweats Eyes: Denies blurriness of vision, double vision or watery eyes Ears, nose, mouth, throat, and face: Denies mucositis or sore throat Respiratory: Denies cough, dyspnea or wheezes Cardiovascular: Denies palpitation, chest discomfort or lower extremity swelling Gastrointestinal:  Denies nausea, heartburn or change in bowel habits Skin: Denies abnormal skin rashes Lymphatics: Denies new lymphadenopathy or easy bruising Neurological:Denies numbness, tingling or new weaknesses Behavioral/Psych: Mood is stable, no new changes  All other systems  were reviewed with the patient and are negative.  PHYSICAL EXAMINATION: ECOG PERFORMANCE STATUS: 0 - Asymptomatic  Vitals:   07/29/19 1255  BP: 124/64  Pulse: 77  Temp: 98.7 F (37.1 C)  SpO2: 100%   Filed Weights   07/29/19 1255  Weight: 146 lb 4 oz (66.3 kg)    GENERAL:alert, no distress and comfortable SKIN: skin color, texture, turgor are normal, no rashes or significant lesions EYES: normal, conjunctiva are pink and non-injected, sclera clear OROPHARYNX:no exudate, no erythema and lips, buccal mucosa, and tongue normal  NECK: supple, thyroid normal size, non-tender, without nodularity LYMPH:  no palpable lymphadenopathy in the cervical, axillary or inguinal LUNGS: clear to auscultation and percussion with normal breathing effort HEART: regular rate & rhythm and no murmurs and no lower extremity edema ABDOMEN:abdomen soft, non-tender and normal bowel sounds Musculoskeletal:no cyanosis of digits and no  clubbing  PSYCH: alert & oriented x 3 with fluent speech NEURO: no focal motor/sensory deficits  RADIOGRAPHIC STUDIES: I have reviewed her CT imaging from 2019 I have personally reviewed the radiological images as listed and agreed with the findings in the report.  ASSESSMENT & PLAN:  Deficiency anemia The cause of her anemia is multifactorial She has normalization of iron deficiency but yet remains anemic Her last serum vitamin B12 level was borderline low She have chronic diabetes for many years I reviewed her CT imaging from 2019 which show distant stomach wall thickening, suspicious for possible peptic ulcer disease even though she is not symptomatic I recommend her to proceed with colonoscopy as scheduled but will call her gastroenterologist to see if EGD can be added Recommend she continues to take iron supplement as prescribed I plan to see her again in 3 months for further follow-up and she is in agreement  CKD stage 2 due to type 2 diabetes mellitus ([place]) She has history of intermittent elevated creatinine She has diabetes for over 10 years Her hemoglobin A1c is borderline at 6.4% We discussed the importance of dietary modification and aggressive management of diabetes to avoid chronic kidney disease I plan to recheck her serum creatinine in her next visit to make sure that chronic kidney disease is not the contributing factor to her anemia  Orders Placed This Encounter  Procedures  . CBC with Differential/Platelet    Standing Status:   Future    Standing Expiration Date:   07/28/2020  . Ferritin    Standing Status:   Future    Standing Expiration Date:   07/28/2020  . Iron and TIBC    Standing Status:   Future    Standing Expiration Date:   07/28/2020  . Sedimentation rate    Standing Status:   Future    Standing Expiration Date:   07/28/2020  . Vitamin B12    Standing Status:   Future    Standing Expiration Date:   07/28/2020  . Basic Metabolic Panel - [place]  Only    Standing Status:   Future    Standing Expiration Date:   07/28/2020     All questions were answered. The patient knows to call the clinic with any problems, questions or concerns.  The total time spent in the appointment was 55 minutes encounter with patients including review of chart and various tests results, discussions about plan of care and coordination of care plan  Heath Lark, MD 5/27/20215:11 PM       Heath Lark, MD 07/29/19 5:11 PM

## 2019-07-29 NOTE — Assessment & Plan Note (Signed)
The cause of her anemia is multifactorial She has normalization of iron deficiency but yet remains anemic Her last serum vitamin B12 level was borderline low She have chronic diabetes for many years I reviewed her CT imaging from 2019 which show distant stomach wall thickening, suspicious for possible peptic ulcer disease even though she is not symptomatic I recommend her to proceed with colonoscopy as scheduled but will call her gastroenterologist to see if EGD can be added Recommend she continues to take iron supplement as prescribed I plan to see her again in 3 months for further follow-up and she is in agreement

## 2019-07-30 ENCOUNTER — Telehealth: Payer: Self-pay

## 2019-07-30 ENCOUNTER — Telehealth: Payer: Self-pay | Admitting: Hematology and Oncology

## 2019-07-30 ENCOUNTER — Telehealth: Payer: Self-pay | Admitting: Physician Assistant

## 2019-07-30 ENCOUNTER — Encounter: Payer: Self-pay | Admitting: Gastroenterology

## 2019-07-30 NOTE — Telephone Encounter (Signed)
Rescheduled EGD/COL to 09/28/19 at 1:30 on.  A message was left on patient's voicemail.  Attempted to call nurse Hassan Rowan back but no one picked up the phone.

## 2019-07-30 NOTE — Telephone Encounter (Signed)
-----   Message from Heath Lark, MD sent at 07/29/2019  5:06 PM EDT ----- Can you call GI, Dr. Rush Landmark to see if he can add EGD when he performs colonoscopy?

## 2019-07-30 NOTE — Telephone Encounter (Signed)
Pt is scheduled for a colonoscopy with Dr. Rush Landmark 07/15/19.  Hassan Rowan, RN from Dr. Calton Dach office reported that pt has iron-deficiency anemia and requested an EGD as well.  Will the pt need to come in for another OV?

## 2019-07-30 NOTE — Telephone Encounter (Signed)
Anderson Malta please advise adding on EGD for IDA

## 2019-07-30 NOTE — Telephone Encounter (Signed)
Called and given below message to office staff. She will give message to Dr. Rush Landmark and call the office back.

## 2019-07-30 NOTE — Telephone Encounter (Signed)
Can you please add EGD to appt if time allows.  If not the pt will need to be rescheduled to another day that will allow both procedures. Thanks.

## 2019-07-30 NOTE — Telephone Encounter (Signed)
Scheduled appts per 5/27 sch msg. Pt confirmed appt date and time.  °

## 2019-07-30 NOTE — Telephone Encounter (Signed)
Pt returned call and was given information about new procedure times. She verbalized understanding.

## 2019-07-30 NOTE — Telephone Encounter (Signed)
Can add on EGD. Thanks-JLL

## 2019-08-03 ENCOUNTER — Telehealth: Payer: Self-pay

## 2019-08-03 NOTE — Telephone Encounter (Signed)
Pt left VM asking if she should continue ferrous gluconate despite it making her stomach uncomfortable and her endoscopy in July, also asked if Dr Alvy Bimler suggested Iron infusions. I referred back to [facility] note where she prefers pt to continue iron supplement then see pt again in 3 mos. Advised pt; verbalized thanks and understanding.

## 2019-08-05 ENCOUNTER — Other Ambulatory Visit: Payer: Self-pay | Admitting: Family Medicine

## 2019-08-10 ENCOUNTER — Encounter: Payer: Self-pay | Admitting: Gastroenterology

## 2019-08-16 ENCOUNTER — Telehealth: Payer: Self-pay

## 2019-08-16 NOTE — Telephone Encounter (Signed)
She called and left a message that she stopped the ferrous gluconate last week doe to nausea/dizziness. She feels better already since she stopped and will not be taking it anymore. EGD and colonoscopy scheduled in July. She is asking if Dr. Alvy Bimler wants to order anything else?

## 2019-08-16 NOTE — Telephone Encounter (Signed)
Nothing to add for now Let's wait and see after EGD/colonoscopy

## 2019-08-16 NOTE — Telephone Encounter (Signed)
Called and given below message. She verbalized understanding. 

## 2019-08-18 ENCOUNTER — Ambulatory Visit: Payer: Medicare HMO | Attending: Family Medicine

## 2019-08-18 ENCOUNTER — Other Ambulatory Visit: Payer: Self-pay

## 2019-08-18 DIAGNOSIS — R252 Cramp and spasm: Secondary | ICD-10-CM | POA: Diagnosis not present

## 2019-08-18 DIAGNOSIS — M5442 Lumbago with sciatica, left side: Secondary | ICD-10-CM | POA: Insufficient documentation

## 2019-08-18 DIAGNOSIS — G8929 Other chronic pain: Secondary | ICD-10-CM | POA: Diagnosis not present
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
## 2019-08-18 NOTE — Patient Instructions (Signed)
Access Code: [email or web address] URL: [email or web address] Date: 08/18/2019 Prepared by: Claiborne Billings  Exercises Supine Lower Trunk Rotation - 3 x daily - 7 x weekly - 1 sets - 3 reps - 20 hold Hooklying Single Knee to Chest - 3 x daily - 7 x weekly - 3 reps - 1 sets - 20 hold Seated Hamstring Stretch - 3 x daily - 7 x weekly - 3 reps - 1 sets - 20 hold Seated Figure 4 Piriformis Stretch - 3 x daily - 7 x weekly - 1 sets - 3 reps - 20-30 hold

## 2019-08-19 ENCOUNTER — Other Ambulatory Visit: Payer: Self-pay

## 2019-08-20 ENCOUNTER — Encounter: Payer: Self-pay | Admitting: Family Medicine

## 2019-08-20 ENCOUNTER — Telehealth: Payer: Self-pay | Admitting: Family Medicine

## 2019-08-20 ENCOUNTER — Ambulatory Visit (INDEPENDENT_AMBULATORY_CARE_PROVIDER_SITE_OTHER): Payer: Medicare HMO | Admitting: Family Medicine

## 2019-08-20 VITALS — BP 80/50 | HR 75 | Temp 97.0°F | Ht 63.0 in | Wt 138.8 lb

## 2019-08-20 DIAGNOSIS — R42 Dizziness and giddiness: Secondary | ICD-10-CM | POA: Diagnosis not present

## 2019-08-20 DIAGNOSIS — D539 Nutritional anemia, unspecified: Secondary | ICD-10-CM

## 2019-08-20 DIAGNOSIS — I9589 Other hypotension: Secondary | ICD-10-CM

## 2019-08-20 LAB — CBC WITH DIFFERENTIAL/PLATELET
Basophils Absolute: 0 10*3/uL (ref 0.0–0.1)
Basophils Relative: 0.8 % (ref 0.0–3.0)
Eosinophils Absolute: 0.1 10*3/uL (ref 0.0–0.7)
Eosinophils Relative: 2.3 % (ref 0.0–5.0)
HCT: 35.1 % — ABNORMAL LOW (ref 36.0–46.0)
Hemoglobin: 11.6 g/dL — ABNORMAL LOW (ref 12.0–15.0)
Lymphocytes Relative: 43.9 % (ref 12.0–46.0)
Lymphs Abs: 2.4 10*3/uL (ref 0.7–4.0)
MCHC: 33.1 g/dL (ref 30.0–36.0)
MCV: 89 fl (ref 78.0–100.0)
Monocytes Absolute: 0.6 10*3/uL (ref 0.1–1.0)
Monocytes Relative: 11 % (ref 3.0–12.0)
Neutro Abs: 2.3 10*3/uL (ref 1.4–7.7)
Neutrophils Relative %: 42 % — ABNORMAL LOW (ref 43.0–77.0)
Platelets: 286 10*3/uL (ref 150.0–400.0)
RBC: 3.94 Mil/uL (ref 3.87–5.11)
RDW: 14.5 % (ref 11.5–15.5)
WBC: 5.5 10*3/uL (ref 4.0–10.5)

## 2019-08-20 LAB — COMPREHENSIVE METABOLIC PANEL
ALT: 5 U/L (ref 0–35)
AST: 20 U/L (ref 0–37)
Albumin: 4.6 g/dL (ref 3.5–5.2)
Alkaline Phosphatase: 46 U/L (ref 39–117)
BUN: 44 mg/dL — ABNORMAL HIGH (ref 6–23)
CO2: 31 mEq/L (ref 19–32)
Calcium: 10.2 mg/dL (ref 8.4–10.5)
Chloride: 98 mEq/L (ref 96–112)
Creatinine, Ser: 1.26 mg/dL — ABNORMAL HIGH (ref 0.40–1.20)
GFR: 51.01 mL/min — ABNORMAL LOW (ref >60.00)
Glucose, Bld: 87 mg/dL (ref 70–99)
Potassium: 4.2 mEq/L (ref 3.5–5.1)
Sodium: 137 mEq/L (ref 135–145)
Total Bilirubin: 0.7 mg/dL (ref 0.2–1.2)
Total Protein: 7.3 g/dL (ref 6.0–8.3)

## 2019-08-20 NOTE — Patient Instructions (Signed)
1) your blood pressure is really low and likely cause of your dizziness. STOP your lisinopril and lasix. Keep a blood pressure log for me and let me know what you are running next week.   2) checking labs to make sure anemia not getting worse and contributing to your dizziness.   3) if any chest pain or shortness of breath, fever, vomiting etc go to ER.   Talk to you next week!  Dr. Rogers Blocker

## 2019-08-20 NOTE — Progress Notes (Signed)
Patient: Kristie King MRN: [id number] DOB: [birth year] PCP: Orma Flaming, MD     Subjective:  Chief Complaint  Patient presents with  . Dizziness  . Neck Pain    Left side stiffness    HPI: The patient is a 69 y.o. female who presents today for Dizziness.    Dizziness First noticed about 6 weeks ago. She thought it was her iron. When she first gets up in the morning she notices it and if she has to bend over she really notices it. If she gets up quickly she will notice this as well. She will be dizzy for 3-5 minutes then it will pass. She is anemic, but last hgb was 11.2. She drinks a lot of water. She denies any ringing in her ears, ear drainage, hearing loss or pain. She feels like she is spinning and the world is standing still. She has had no nausea/vomiting. She has not fallen. She has no dizziness with head turning. No fever/chills. She is on lisinopril 20mg  for her blood pressure. She was also on lasix as needed and takes about 3x/week. Was given this by previous provider for as needed leg swelling.  Denies any vision changes, chest pain or shortness of breath. She is able to work out fine with no symptoms at home. She has not taken her blood pressure medication today.   Anemia  She had stopper her oral iron, but has started it back since calling hematologist. She has colonoscopy scheduled for 7/24.   Review of Systems  Respiratory: Negative for cough, shortness of breath and wheezing.   Cardiovascular: Negative for chest pain and palpitations.  Gastrointestinal: Negative for abdominal pain, nausea and vomiting.  Musculoskeletal: Positive for neck stiffness.  Neurological: Positive for dizziness. Negative for tremors, syncope, weakness, light-headedness and headaches.    Allergies Patient has No Known Allergies.  Past Medical History Patient  has a past medical history of AC (acromioclavicular) arthritis (11/13/2016), Cervical radiculopathy at C6 (09/10/2016), CKD stage 2  due to type 2 diabetes mellitus ([place]) (04/14/2018), Diabetes mellitus without complication ([place]), History of lumbar laminectomy (2019), HTN (hypertension) (09/17/2012), Hyperlipidemia associated with type 2 diabetes mellitus ([place]) (09/17/2012), Hypertension associated with diabetes ([place]) (04/14/2018), Iron deficiency anemia (10/16/2016), Osteoarthritis of spine with radiculopathy, cervical region (08/03/2016), and Vitamin D deficiency (10/16/2016).  Surgical History Patient  has a past surgical history that includes Bunionectomy (Right, 02/18/2013); Bunionectomy (Left, 04/01/2013); and Back surgery (03/30/2018).  Family History [name]'s family history includes Diabetes in her brother and sister; Hyperlipidemia in her sister; Hypertension in her brother and sister; Stroke in her father, maternal grandfather, and mother.  Social History Patient  reports that she quit smoking about 48 years ago. Her smoking use included cigarettes. She quit after 10.00 years of use. She has never used smokeless tobacco. She reports current alcohol use. She reports that she does not use drugs.    Objective: Vitals:   08/20/19 1129  BP: (!) 80/50  Pulse: 75  Temp: (!) 97 F (36.1 C)  TempSrc: Temporal  SpO2: 99%  Weight: 138 lb 12.8 oz (63 kg)  Height: 5\' 3"  (1.6 m)    Body mass index is 24.59 kg/m.   Repeat bp: 100/60 Orthostatics in chart.   Physical Exam Vitals reviewed.  Constitutional:      Appearance: Normal appearance. She is normal weight.  HENT:     Head: Normocephalic and atraumatic.     Right Ear: Tympanic membrane, ear canal and external ear normal.  Left Ear: Tympanic membrane, ear canal and external ear normal.     Nose: Nose normal.     Mouth/Throat:     Mouth: Mucous membranes are moist.  Eyes:     Extraocular Movements: Extraocular movements intact.     Pupils: Pupils are equal, round, and reactive to light.  Neck:     Vascular: No carotid bruit.  Cardiovascular:     Rate and  Rhythm: Normal rate and regular rhythm.     Heart sounds: Normal heart sounds. No murmur heard.   Pulmonary:     Effort: Pulmonary effort is normal.     Breath sounds: Normal breath sounds.  Abdominal:     General: Abdomen is flat. Bowel sounds are normal.     Palpations: Abdomen is soft.  Musculoskeletal:     Cervical back: Normal range of motion and neck supple.  Neurological:     General: No focal deficit present.     Mental Status: She is alert and oriented to person, place, and time.     Cranial Nerves: No cranial nerve deficit.     Sensory: No sensory deficit.     Motor: No weakness.     Gait: Gait normal.     Deep Tendon Reflexes: Reflexes normal.     Comments: dix-halpike negative bilaterally         Assessment/plan: 1. Dizzinesses Likely secondary to #2. Holding her lisinopril and want her to stop lasix. Make sure adequate hydration. She is to call me on Monday with readings. If any chest pain/shortness of breath, syncope, etc. She is to go to ER. Checking labs with anemia history. No evidence of vertigo.   2. Other specified hypotension Holding lisinopril, stopping lasix prn. Checking labs. precautions given. See above. Touch base on Monday with blood pressure log.   3. Deficiency anemia Checking cbc to make sure not worsening anemia contributing to hypotension/symtpoms.  - CBC with Differential/Platelet - Comprehensive metabolic panel    This visit occurred during the SARS-CoV-2 public health emergency.  Safety protocols were in place, including screening questions prior to the visit, additional usage of staff PPE, and extensive cleaning of exam room while observing appropriate contact time as indicated for disinfecting solutions.     Return if symptoms worsen or fail to improve.   Orma Flaming, MD [place]   08/20/2019

## 2019-08-20 NOTE — Telephone Encounter (Signed)
Patient is scheduled for 11:20 this morning.   Chief Complaint Dizziness Reason for Call Symptomatic / Request for Health Information Initial Comment Caller states that she has been experiencing dizziness each morning. She also has a nervousness feeling in her hands at times and she has to shake it out to make that feel stop. Caller was transferred to [place] by scheduling to be triaged before scheduled. Translation No Nurse Assessment Nurse: Neena Rhymes, RN, Sharyn Lull Date/Time (Eastern Time): 08/19/2019 10:20:12 AM Confirm and document reason for call. If symptomatic, describe symptoms. ---Caller states that she has been experiencing dizziness each morning. She also has a nervousness feeling in her hands at times and she has to shake it out to make that feel stop. States she stopped iron 7 days ago. Has the patient had close contact with a person known or suspected to have the novel coronavirus illness OR traveled / lives in area with major community spread (including international travel) in the last 14 days from the onset of symptoms? * If Asymptomatic, screen for exposure and travel within the last 14 days. ---No Does the patient have any new or worsening symptoms? ---Yes Will a triage be completed? ---Yes Related visit to physician within the last 2 weeks? ---No Does the PT have any chronic conditions? (i.e. diabetes, asthma, this includes High risk factors for pregnancy, etc.) ---Yes List chronic conditions. ---DM, HBP, high CHO Is this a behavioral health or substance abuse call? ---NoPLEASE NOTE: All timestamps contained within this report are represented as [place] Standard Time. CONFIDENTIALTY NOTICE: This fax transmission is intended only for the addressee. It contains information that is legally privileged, confidential or otherwise protected from use or disclosure. If you are not the intended recipient, you are strictly prohibited from reviewing, disclosing, copying using or  disseminating any of this information or taking any action in reliance on or regarding this information. If you have received this fax in error, please notify us immediately by telephone so that we can arrange for its return to [place]. Phone: [phone], Toll-Free: [phone], Fax: [phone] Page: 2 of 2 Call Id: [id number] Guidelines Guideline Title Affirmed Question Affirmed Notes Nurse Date/Time Eilene Ghazi Time) Dizziness - Lightheadedness [1] MODERATE dizziness (e.g., interferes with normal activities) AND [2] has NOT been evaluated by physician for this (Exception: dizziness caused by heat exposure, sudden standing, or poor fluid intake) Neena Rhymes, RN, Sharyn Lull 08/19/2019 10:24:44 AM Disp. Time Eilene Ghazi Time) Disposition Final User 08/19/2019 10:28:57 AM See PCP within 24 Hours Yes [place], RN, Sidonie Dickens

## 2019-08-23 ENCOUNTER — Telehealth: Payer: Self-pay | Admitting: Family Medicine

## 2019-08-23 NOTE — Telephone Encounter (Signed)
Can you call and see how she is feeling? Make sure OFF her lisinopril.  Orma Flaming, MD [place]

## 2019-08-23 NOTE — Telephone Encounter (Signed)
Patient is calling in to give blood pressure readings:  Saturday:103/62 P:81 --8:20 am Sunday: 125/71 P:63 --11:49 am Monday:107/61 P:61 ---10:15 am

## 2019-08-23 NOTE — Telephone Encounter (Signed)
I spoke with pt and gave message below. She says that she felt good over the weekend, she had no dizziness. Although, she experienced dizziness once on Saturday. She thinks that Lisinopril was the problem. She is no longer having frequent lightheadedness.

## 2019-08-23 NOTE — Telephone Encounter (Signed)
FYI

## 2019-09-13 ENCOUNTER — Other Ambulatory Visit: Payer: Self-pay | Admitting: Family Medicine

## 2019-09-20 ENCOUNTER — Encounter: Payer: Self-pay | Admitting: Gastroenterology

## 2019-09-28 ENCOUNTER — Encounter: Payer: Self-pay | Admitting: Gastroenterology

## 2019-09-28 ENCOUNTER — Ambulatory Visit (AMBULATORY_SURGERY_CENTER): Payer: Medicare HMO | Admitting: Gastroenterology

## 2019-09-28 ENCOUNTER — Other Ambulatory Visit: Payer: Self-pay

## 2019-09-28 VITALS — BP 143/85 | HR 71 | Temp 96.0°F | Resp 14 | Ht 63.0 in | Wt 142.0 lb

## 2019-09-28 DIAGNOSIS — K449 Diaphragmatic hernia without obstruction or gangrene: Secondary | ICD-10-CM | POA: Diagnosis not present

## 2019-09-28 DIAGNOSIS — D123 Benign neoplasm of transverse colon: Secondary | ICD-10-CM | POA: Diagnosis not present

## 2019-09-28 DIAGNOSIS — K6389 Other specified diseases of intestine: Secondary | ICD-10-CM | POA: Diagnosis not present

## 2019-09-28 DIAGNOSIS — R194 Change in bowel habit: Secondary | ICD-10-CM

## 2019-09-28 DIAGNOSIS — D508 Other iron deficiency anemias: Secondary | ICD-10-CM

## 2019-09-28 DIAGNOSIS — K641 Second degree hemorrhoids: Secondary | ICD-10-CM

## 2019-09-28 DIAGNOSIS — K2970 Gastritis, unspecified, without bleeding: Secondary | ICD-10-CM

## 2019-09-28 DIAGNOSIS — D509 Iron deficiency anemia, unspecified: Secondary | ICD-10-CM | POA: Diagnosis not present

## 2019-09-28 DIAGNOSIS — K2950 Unspecified chronic gastritis without bleeding: Secondary | ICD-10-CM | POA: Diagnosis not present

## 2019-09-28 DIAGNOSIS — B9681 Helicobacter pylori [H. pylori] as the cause of diseases classified elsewhere: Secondary | ICD-10-CM | POA: Diagnosis not present

## 2019-09-28 MED ORDER — SODIUM CHLORIDE 0.9 % IV SOLN: 500.0000 mL | Freq: Once | INTRAVENOUS | Status: DC

## 2019-09-28 NOTE — Op Note (Addendum)
[facility] Patient Name: Kristie King Procedure Date: 09/28/2019 1:16 PM MRN: [id number] Endoscopist: Justice Britain , MD Age: 69 Referring MD:  Date of Birth: 25-Apr-1950 Gender: Female Account #: [id number] Procedure:                Colonoscopy Indications:              Screening for colorectal malignant neoplasm, This                            is the patient's first colonoscopy, Incidental -                            Iron deficiency anemia, Incidental change in bowel                            habits noted Medicines:                Monitored Anesthesia Care Procedure:                Pre-Anesthesia Assessment:                           - Prior to the procedure, a History and Physical                            was performed, and patient medications and                            allergies were reviewed. The patient's tolerance of                            previous anesthesia was also reviewed. The risks                            and benefits of the procedure and the sedation                            options and risks were discussed with the patient.                            All questions were answered, and informed consent                            was obtained. Prior Anticoagulants: The patient has                            taken no previous anticoagulant or antiplatelet                            agents. ASA Grade Assessment: II - A patient with                            mild systemic disease. After reviewing the risks  and benefits, the patient was deemed in                            satisfactory condition to undergo the procedure.                           After obtaining informed consent, the colonoscope                            was passed under direct vision. Throughout the                            procedure, the patient's blood pressure, pulse, and                            oxygen saturations were monitored continuously.  The                            Colonoscope was introduced through the anus and                            advanced to the the cecum, identified by                            appendiceal orifice and ileocecal valve. The                            colonoscopy was performed without difficulty. The                            patient tolerated the procedure. The quality of the                            bowel preparation was good. The ileocecal valve,                            appendiceal orifice, and rectum were photographed. Scope In: 1:56:13 PM Scope Out: 2:13:33 PM Scope Withdrawal Time: 0 hours 10 minutes 40 seconds  Total Procedure Duration: 0 hours 17 minutes 20 seconds  Findings:                 The digital rectal exam findings include                            hemorrhoids. Pertinent negatives include no                            palpable rectal lesions.                           A diffuse area of mild melanosis was found in the                            entire colon.  A 5 mm polyp was found in the hepatic flexure. The                            polyp was sessile. The polyp was removed with a                            cold snare. Resection and retrieval were complete.                           Non-bleeding non-thrombosed external and internal                            hemorrhoids were found during retroflexion, during                            perianal exam and during digital exam. The                            hemorrhoids were Grade II (internal hemorrhoids                            that prolapse but reduce spontaneously). Complications:            No immediate complications. Estimated Blood Loss:     Estimated blood loss: none. Impression:               - Hemorrhoids found on digital rectal exam.                           - Melanosis in the colon.                           - One 5 mm polyp at the hepatic flexure, removed                             with a cold snare. Resected and retrieved.                           - Non-bleeding non-thrombosed external and internal                            hemorrhoids. Recommendation:           - The patient will be observed post-procedure,                            until all discharge criteria are met.                           - Discharge patient to home.                           - Patient has a contact number available for                            emergencies. The signs  and symptoms of potential                            delayed complications were discussed with the                            patient. Return to normal activities tomorrow.                            Written discharge instructions were provided to the                            patient.                           - High fiber diet.                           - Use FiberCon 1-2 tablets PO daily.                           - Continue present medications.                           - Await pathology results.                           - Repeat colonoscopy in 7/10 years for surveillance                            based on pathology results based on findings of                            adenomatous tissue are found.                           - Return to GI clinic to further discuss potential                            laxative options.                           - May consider Laxative therapies including                            Linzess/Trulance/Amitiza in future should patient                            desire. Her current therapy of Tea with Senna helps                            her move bowels daily. If she wishes to continue                            this, it is likely that this is reason for  melanosis.                           - Patient current no longer has Iron deficiency                            based on 5/21 labs. If patient develops or recurs                            to have Iron  deficiency then would recommend IV                            Iron supplementation as well a Video Capsule                            Endoscopy.                           - The findings and recommendations were discussed                            with the patient. Justice Britain, MD 09/28/2019 2:26:26 PM Addendum Number: 1   Addendum Date: 10/01/2019 2:15:16 PM      This was not the patient's first Colonoscopy. That was incorrectly       dictated at time of initial procedure note. Justice Britain, MD 10/01/2019 2:15:42 PM

## 2019-09-28 NOTE — Progress Notes (Signed)
PT taken to PACU. Monitors in place. VSS. Report given to RN. 

## 2019-09-28 NOTE — Op Note (Signed)
[facility] Patient Name: Kristie King Procedure Date: 09/28/2019 1:18 PM MRN: [id number] Endoscopist: Justice Britain , MD Age: 69 Referring MD:  Date of Birth: 1951-02-18 Gender: Female Account #: [id number] Procedure:                Upper GI endoscopy Indications:              Unexplained iron deficiency anemia Medicines:                Monitored Anesthesia Care Procedure:                Pre-Anesthesia Assessment:                           - Prior to the procedure, a History and Physical                            was performed, and patient medications and                            allergies were reviewed. The patient's tolerance of                            previous anesthesia was also reviewed. The risks                            and benefits of the procedure and the sedation                            options and risks were discussed with the patient.                            All questions were answered, and informed consent                            was obtained. Prior Anticoagulants: The patient has                            taken no previous anticoagulant or antiplatelet                            agents. ASA Grade Assessment: II - A patient with                            mild systemic disease. After reviewing the risks                            and benefits, the patient was deemed in                            satisfactory condition to undergo the procedure.                           After obtaining informed consent, the endoscope was  passed under direct vision. Throughout the                            procedure, the patient's blood pressure, pulse, and                            oxygen saturations were monitored continuously. The                            Endoscope was introduced through the mouth, and                            advanced to the second part of duodenum. The upper                            GI endoscopy was  accomplished without difficulty.                            The patient tolerated the procedure. Scope In: Scope Out: Findings:                 No gross lesions were noted in the entire esophagus.                           A 2 cm hiatal hernia was found. The proximal extent                            of the gastric folds (end of tubular esophagus) was                            34 cm from the incisors. The hiatal narrowing was                            36 cm from the incisors. The Z-line was 34 cm from                            the incisors.                           Patchy mildly erythematous mucosa without bleeding                            was found in the gastric body.                           No other gross lesions were noted in the entire                            examined stomach. Biopsies were taken with a cold                            forceps for histology and Helicobacter pylori  testing.                           No gross lesions were noted in the duodenal bulb,                            in the first portion of the duodenum and in the                            second portion of the duodenum. Biopsies for                            histology were taken with a cold forceps for                            evaluation of celiac disease. Complications:            No immediate complications. Estimated Blood Loss:     Estimated blood loss was minimal. Impression:               - No gross lesions in esophagus.                           - 2 cm hiatal hernia.                           - Erythematous mucosa in the gastric body. No other                            gross lesions in the stomach. Biopsied.                           - No gross lesions in the duodenal bulb, in the                            first portion of the duodenum and in the second                            portion of the duodenum. Biopsied. Recommendation:           - Proceed to  scheduled colonoscopy.                           - Await pathology results.                           - Will consider addition of PPI after EGD biopsy                            results return.                           - Observe patient's clinical course.                           - The findings and recommendations were discussed  with the patient. Justice Britain, MD 09/28/2019 2:20:13 PM

## 2019-09-28 NOTE — Progress Notes (Signed)
Called to room to assist during endoscopic procedure.  Patient ID and intended procedure confirmed with present staff. Received instructions for my participation in the procedure from the performing physician.  

## 2019-09-28 NOTE — Patient Instructions (Signed)
Handouts given for Gastritis, Hiatal hernia, polyps, hemorrhoids and high fiber diet.  Use FiberCon 1-2 tablets daily, drink extra water with these.  Await pathology results.  YOU HAD AN ENDOSCOPIC PROCEDURE TODAY AT [facility]:   Refer to the procedure report that was given to you for any specific questions about what was found during the examination.  If the procedure report does not answer your questions, please call your gastroenterologist to clarify.  If you requested that your care partner not be given the details of your procedure findings, then the procedure report has been included in a sealed envelope for you to review at your convenience later.  YOU SHOULD EXPECT: Some feelings of bloating in the abdomen. Passage of more gas than usual.  Walking can help get rid of the air that was put into your GI tract during the procedure and reduce the bloating. If you had a lower endoscopy (such as a colonoscopy or flexible sigmoidoscopy) you may notice spotting of blood in your stool or on the toilet paper. If you underwent a bowel prep for your procedure, you may not have a normal bowel movement for a few days.  Please Note:  You might notice some irritation and congestion in your nose or some drainage.  This is from the oxygen used during your procedure.  There is no need for concern and it should clear up in a day or so.  SYMPTOMS TO REPORT IMMEDIATELY:   Following lower endoscopy (colonoscopy or flexible sigmoidoscopy):  Excessive amounts of blood in the stool  Significant tenderness or worsening of abdominal pains  Swelling of the abdomen that is new, acute  Fever of 100F or higher   Following upper endoscopy (EGD)  Vomiting of blood or coffee ground material  New chest pain or pain under the shoulder blades  Painful or persistently difficult swallowing  New shortness of breath  Fever of 100F or higher  Black, tarry-looking stools  For urgent or emergent issues,  a gastroenterologist can be reached at any hour by calling [phone]. Do not use MyChart messaging for urgent concerns.    DIET:  We do recommend a small meal at first, but then you may proceed to your regular diet.  Drink plenty of fluids but you should avoid alcoholic beverages for 24 hours.  ACTIVITY:  You should plan to take it easy for the rest of today and you should NOT DRIVE or use heavy machinery until tomorrow (because of the sedation medicines used during the test).    FOLLOW UP: Our staff will call the number listed on your records 48-72 hours following your procedure to check on you and address any questions or concerns that you may have regarding the information given to you following your procedure. If we do not reach you, we will leave a message.  We will attempt to reach you two times.  During this call, we will ask if you have developed any symptoms of COVID 19. If you develop any symptoms (ie: fever, flu-like symptoms, shortness of breath, cough etc.) before then, please call [phone].  If you test positive for Covid 19 in the 2 weeks post procedure, please call and report this information to [place].    If any biopsies were taken you will be contacted by phone or by letter within the next 1-3 weeks.  Please call us at [phone] if you have not heard about the biopsies in 3 weeks.    SIGNATURES/CONFIDENTIALITY: You and/or  your care partner have signed paperwork which will be entered into your electronic medical record.  These signatures attest to the fact that that the information above on your After Visit Summary has been reviewed and is understood.  Full responsibility of the confidentiality of this discharge information lies with you and/or your care-partner.

## 2019-09-28 NOTE — Progress Notes (Signed)
V/S-CW  Check-in-JB

## 2019-09-30 ENCOUNTER — Other Ambulatory Visit: Payer: Self-pay | Admitting: Family Medicine

## 2019-09-30 ENCOUNTER — Telehealth: Payer: Self-pay

## 2019-09-30 DIAGNOSIS — E119 Type 2 diabetes mellitus without complications: Secondary | ICD-10-CM

## 2019-09-30 NOTE — Telephone Encounter (Signed)
1st follow up call made.  NALM 

## 2019-09-30 NOTE — Telephone Encounter (Signed)
  Follow up Call-  Call back number 09/28/2019  Post procedure Call Back phone  # [phone]  Permission to leave phone message Yes  Some recent data might be hidden     Patient questions:  Do you have a fever, pain , or abdominal swelling? No. Pain Score  0 *  Have you tolerated food without any problems? Yes.    Have you been able to return to your normal activities? Yes.    Do you have any questions about your discharge instructions: Diet   No. Medications  No. Follow up visit  No.  Do you have questions or concerns about your Care? No.  Actions: * If pain score is 4 or above: No action needed, pain <4.  1. Have you developed a fever since your procedure? no  2.   Have you had an respiratory symptoms (SOB or cough) since your procedure? no  3.   Have you tested positive for COVID 19 since your procedure no  4.   Have you had any family members/close contacts diagnosed with the COVID 19 since your procedure?  no   If yes to any of these questions please route to Joylene John, RN and Erenest Rasher, RN

## 2019-10-05 ENCOUNTER — Encounter: Payer: Self-pay | Admitting: Gastroenterology

## 2019-10-07 ENCOUNTER — Other Ambulatory Visit: Payer: Self-pay

## 2019-10-07 ENCOUNTER — Telehealth: Payer: Self-pay | Admitting: Gastroenterology

## 2019-10-07 ENCOUNTER — Encounter: Payer: Self-pay | Admitting: Family Medicine

## 2019-10-07 DIAGNOSIS — A048 Other specified bacterial intestinal infections: Secondary | ICD-10-CM

## 2019-10-07 MED ORDER — AMOXICILLIN 500 MG PO TABS
1000.0000 mg | ORAL_TABLET | Freq: Two times a day (BID) | ORAL | 0 refills | Status: AC
Start: 2019-10-07 — End: 2019-10-21

## 2019-10-07 MED ORDER — OMEPRAZOLE 20 MG PO CPDR: 20.0000 mg | DELAYED_RELEASE_CAPSULE | Freq: Two times a day (BID) | ORAL | 0 refills | Status: DC

## 2019-10-07 MED ORDER — CIPROFLOXACIN HCL 500 MG PO TABS
500.0000 mg | ORAL_TABLET | Freq: Two times a day (BID) | ORAL | 0 refills | Status: AC
Start: 2019-10-07 — End: 2019-10-21

## 2019-10-07 MED ORDER — METRONIDAZOLE 250 MG PO TABS: 500.0000 mg | ORAL_TABLET | Freq: Two times a day (BID) | ORAL | 0 refills | Status: AC

## 2019-10-07 NOTE — Telephone Encounter (Signed)
Left message on machine to call back will await further communication from the pt.  She was going to check My Chart and call back if she has concerns

## 2019-10-11 ENCOUNTER — Encounter: Payer: Self-pay | Admitting: Hematology and Oncology

## 2019-10-11 ENCOUNTER — Telehealth: Payer: Self-pay

## 2019-10-11 ENCOUNTER — Inpatient Hospital Stay: Payer: Medicare HMO | Attending: Hematology and Oncology | Admitting: Hematology and Oncology

## 2019-10-11 ENCOUNTER — Inpatient Hospital Stay: Payer: Medicare HMO

## 2019-10-11 ENCOUNTER — Other Ambulatory Visit: Payer: Self-pay

## 2019-10-11 DIAGNOSIS — E119 Type 2 diabetes mellitus without complications: Secondary | ICD-10-CM | POA: Insufficient documentation

## 2019-10-11 DIAGNOSIS — D539 Nutritional anemia, unspecified: Secondary | ICD-10-CM

## 2019-10-11 LAB — BASIC METABOLIC PANEL - CANCER CENTER ONLY
Anion gap: 7 (ref 5–15)
BUN: 20 mg/dL (ref 8–23)
CO2: 27 mmol/L (ref 22–32)
Calcium: 10 mg/dL (ref 8.9–10.3)
Chloride: 106 mmol/L (ref 98–111)
Creatinine: 0.93 mg/dL (ref 0.44–1.00)
GFR, Est AFR Am: >60 mL/min (ref >60)
GFR, Estimated: >60 mL/min (ref >60)
Glucose, Bld: 87 mg/dL (ref 70–99)
Potassium: 3.7 mmol/L (ref 3.5–5.1)
Sodium: 140 mmol/L (ref 135–145)

## 2019-10-11 LAB — FERRITIN: Ferritin: 174 ng/mL (ref 11–307)

## 2019-10-11 LAB — VITAMIN B12: Vitamin B-12: 390 pg/mL (ref 180–914)

## 2019-10-11 LAB — CBC WITH DIFFERENTIAL/PLATELET
Abs Immature Granulocytes: 0.01 10*3/uL (ref 0.00–0.07)
Basophils Absolute: 0 10*3/uL (ref 0.0–0.1)
Basophils Relative: 1 %
Eosinophils Absolute: 0.2 10*3/uL (ref 0.0–0.5)
Eosinophils Relative: 3 %
HCT: 32.2 % — ABNORMAL LOW (ref 36.0–46.0)
Hemoglobin: 10.3 g/dL — ABNORMAL LOW (ref 12.0–15.0)
Immature Granulocytes: 0 %
Lymphocytes Relative: 36 %
Lymphs Abs: 1.8 10*3/uL (ref 0.7–4.0)
MCH: 29.4 pg (ref 26.0–34.0)
MCHC: 32 g/dL (ref 30.0–36.0)
MCV: 92 fL (ref 80.0–100.0)
Monocytes Absolute: 0.7 10*3/uL (ref 0.1–1.0)
Monocytes Relative: 14 %
Neutro Abs: 2.3 10*3/uL (ref 1.7–7.7)
Neutrophils Relative %: 46 %
Platelets: 254 10*3/uL (ref 150–400)
RBC: 3.5 MIL/uL — ABNORMAL LOW (ref 3.87–5.11)
RDW: 12.8 % (ref 11.5–15.5)
WBC: 5 10*3/uL (ref 4.0–10.5)
nRBC: 0 % (ref 0.0–0.2)

## 2019-10-11 LAB — SEDIMENTATION RATE: Sed Rate: 20 mm/hr (ref 0–22)

## 2019-10-11 LAB — IRON AND TIBC
Iron: 81 ug/dL (ref 41–142)
Saturation Ratios: 28 % (ref 21–57)
TIBC: 285 ug/dL (ref 236–444)
UIBC: 204 ug/dL (ref 120–384)

## 2019-10-11 NOTE — Progress Notes (Signed)
[facility] OFFICE PROGRESS NOTE  Orma Flaming, MD  ASSESSMENT & PLAN:  Deficiency anemia The cause of her anemia is multifactorial, likely combination of anemia of chronic disease from diabetes as well as fluctuation of her kidney function She was recently found to have H. pylori infection and is prescribed antibiotics She has not completed her treatment yet She has discontinued taking oral iron sulfate due to poor tolerance; at the time of dictation, iron studies came back within normal limits Currently, she is not symptomatic from anemia I plan to see her back in 6 months for further follow-up   No orders of the defined types were placed in this encounter.   The total time spent in the appointment was 15 minutes encounter with patients including review of chart and various tests results, discussions about plan of care and coordination of care plan   All questions were answered. The patient knows to call the clinic with any problems, questions or concerns. No barriers to learning was detected.    Heath Lark, MD 8/9/202112:04 PM  INTERVAL HISTORY: Kristie King 69 y.o. female returns for further follow-up on anemia She had completed EGD and colonoscopy which show signs of gastritis Biopsy confirmed H. pylori infection She could not tolerate oral iron supplement The patient denies any recent signs or symptoms of bleeding such as spontaneous epistaxis, hematuria or hematochezia.  SUMMARY OF HEMATOLOGIC HISTORY:  She was found to have abnormal CBC from CBC monitoring with her primary care doctor I have the opportunity to review his CBC dated back to 2014 Her hemoglobin typically range between 10.5-12.2 She denies recent chest pain on exertion, shortness of breath on minimal exertion, pre-syncopal episodes, or palpitations. She had not noticed any recent bleeding such as epistaxis, hematuria or hematochezia The patient denies over the counter NSAID ingestion. She is  not on antiplatelets agents. Her last colonoscopy was in 2018 and it was reported as normal but she is scheduled for another repeat colonoscopy next month She had no prior history or diagnosis of cancer. Her age appropriate screening programs are up-to-date. She denies any pica and eats a variety of diet. She never donated blood or received blood transfusion The patient was prescribed oral iron supplements and she takes iron twice a day usually around her mealtime She has occasional nausea She have intermittent chronic constipation She has history of sarcoidosis but she does not take chronic prednisone therapy She has been very active since retirement She has gained almost 10 pounds since COVID-19 restriction and is looking forward to exercising at the gym again On 09/28/2019: EGD showed No gross lesions in esophagus. - 2 cm hiatal hernia. - Erythematous mucosa in the gastric body. No other gross lesions in the stomach. Biopsied On 09/28/2019, colonoscopy showed  Hemorrhoids found on digital rectal exam. - Melanosis in the colon. - One 5 mm polyp at the hepatic flexure, removed with a cold snare. Resected and retrieved. - Non-bleeding non-thrombosed external and internal hemorrhoids. BIOPSY from 09/28/2019 1. Surgical [P], duodenal biopsies - BENIGN DUODENAL MUCOSA. - NO FEATURES OF CELIAC SPRUE OR GRANULOMAS. - GASTRIC FRAGMENTS WITH MILDLY ACTIVE CHRONIC GASTRITIS (HELICOBACTER PYLORI TYPE). 2. Surgical [P], gastric antrum and gastric body - GASTRIC MUCOSA WITH MILDLY ACTIVE CHRONIC HELICOBACTER PYLORI GASTRITIS. - WARTHIN-STARRY POSITIVE FOR HELICOBACTER PYLORI. - NO INTESTINAL METAPLASIA, DYSPLASIA OR CARCINOMA. 3. Surgical [P], colon, hepatic flexure, polyp - TUBULAR ADENOMA(S). - NO HIGH GRADE DYSPLASIA OR CARCINOMA.  I have reviewed the past medical history,  past surgical history, social history and family history with the patient and they are unchanged from previous  note.  ALLERGIES:  has No Known Allergies.  MEDICATIONS:  Current Outpatient Medications  Medication Sig Dispense Refill   amoxicillin (AMOXIL) 500 MG tablet Take 2 tablets (1,000 mg total) by mouth 2 (two) times daily for 14 days. 56 tablet 0   atorvastatin (LIPITOR) 20 MG tablet TAKE 1 TABLET (20 MG TOTAL) BY MOUTH DAILY AT 6 PM. 90 tablet 0   ciprofloxacin (CIPRO) 500 MG tablet Take 1 tablet (500 mg total) by mouth 2 (two) times daily for 14 days. 28 tablet 0   metFORMIN (GLUCOPHAGE) 1000 MG tablet Take 1 tablet (1,000 mg total) by mouth daily with breakfast. Mon, wed fri 90 tablet 1   metroNIDAZOLE (FLAGYL) 250 MG tablet Take 2 tablets (500 mg total) by mouth 2 (two) times daily for 14 days. 56 tablet 0   omeprazole (PRILOSEC) 20 MG capsule Take 1 capsule (20 mg total) by mouth 2 (two) times daily before a meal. 60 capsule 0   OVER THE COUNTER MEDICATION [place] dieters laxative tea ( senna leaf and mellow tea )     traZODone (DESYREL) 50 MG tablet Take 0.5-1 tablets (25-50 mg total) by mouth at bedtime as needed for sleep. (Patient not taking: Reported on 09/28/2019) 90 tablet 1   TRULICITY 1.5 HG/9.9ME SOPN INJECT 0.5 MLS INTO THE SKIN ONCE A WEEK. 4 pen 3   No current facility-administered medications for this visit.     REVIEW OF SYSTEMS:   Constitutional: Denies fevers, chills or night sweats Eyes: Denies blurriness of vision Ears, nose, mouth, throat, and face: Denies mucositis or sore throat Respiratory: Denies cough, dyspnea or wheezes Cardiovascular: Denies palpitation, chest discomfort or lower extremity swelling Gastrointestinal:  Denies nausea, heartburn or change in bowel habits Skin: Denies abnormal skin rashes Lymphatics: Denies new lymphadenopathy or easy bruising Neurological:Denies numbness, tingling or new weaknesses Behavioral/Psych: Mood is stable, no new changes  All other systems were reviewed with the patient and are negative.  PHYSICAL  EXAMINATION: ECOG PERFORMANCE STATUS: 0 - Asymptomatic  Vitals:   10/11/19 0956  BP: (!) 141/75  Pulse: 66  Resp: 18  Temp: 97.9 F (36.6 C)  SpO2: 100%   Filed Weights   10/11/19 0956  Weight: 142 lb (64.4 kg)    GENERAL:alert, no distress and comfortable NEURO: alert & oriented x 3 with fluent speech, no focal motor/sensory deficits  LABORATORY DATA:  I have reviewed the data as listed     Component Value Date/Time   NA 140 10/11/2019 0931   K 3.7 10/11/2019 0931   CL 106 10/11/2019 0931   CO2 27 10/11/2019 0931   GLUCOSE 87 10/11/2019 0931   BUN 20 10/11/2019 0931   CREATININE 0.93 10/11/2019 0931   CREATININE 1.16 (H) 08/25/2013 1830   CALCIUM 10.0 10/11/2019 0931   PROT 7.3 08/20/2019 1227   ALBUMIN 4.6 08/20/2019 1227   AST 20 08/20/2019 1227   ALT 5 08/20/2019 1227   ALKPHOS 46 08/20/2019 1227   BILITOT 0.7 08/20/2019 1227   GFRNONAA >60 10/11/2019 0931   GFRAA >60 10/11/2019 0931    No results found for: SPEP, UPEP  Lab Results  Component Value Date   WBC 5.0 10/11/2019   NEUTROABS 2.3 10/11/2019   HGB 10.3 (L) 10/11/2019   HCT 32.2 (L) 10/11/2019   MCV 92.0 10/11/2019   PLT 254 10/11/2019      Chemistry  Component Value Date/Time   NA 140 10/11/2019 0931   K 3.7 10/11/2019 0931   CL 106 10/11/2019 0931   CO2 27 10/11/2019 0931   BUN 20 10/11/2019 0931   CREATININE 0.93 10/11/2019 0931   CREATININE 1.16 (H) 08/25/2013 1830      Component Value Date/Time   CALCIUM 10.0 10/11/2019 0931   ALKPHOS 46 08/20/2019 1227   AST 20 08/20/2019 1227   ALT 5 08/20/2019 1227   BILITOT 0.7 08/20/2019 1227

## 2019-10-11 NOTE — Assessment & Plan Note (Addendum)
The cause of her anemia is multifactorial, likely combination of anemia of chronic disease from diabetes as well as fluctuation of her kidney function She was recently found to have H. pylori infection and is prescribed antibiotics She has not completed her treatment yet She has discontinued taking oral iron sulfate due to poor tolerance; at the time of dictation, iron studies came back within normal limits Currently, she is not symptomatic from anemia I plan to see her back in 6 months for further follow-up

## 2019-10-11 NOTE — Telephone Encounter (Signed)
Called per Dr. Alvy Bimler, today's labs look okay. She can stop the iron tablet. Dr. Alvy Bimler will see her in 6 months. She verbalized understanding.

## 2019-10-12 ENCOUNTER — Telehealth: Payer: Self-pay | Admitting: Hematology and Oncology

## 2019-10-12 NOTE — Telephone Encounter (Signed)
Scheduled per 8/9 los. Mailing appt letter and calendar printout

## 2019-10-18 ENCOUNTER — Ambulatory Visit (INDEPENDENT_AMBULATORY_CARE_PROVIDER_SITE_OTHER): Payer: Medicare HMO | Admitting: Family Medicine

## 2019-10-18 ENCOUNTER — Other Ambulatory Visit: Payer: Self-pay

## 2019-10-18 ENCOUNTER — Encounter: Payer: Self-pay | Admitting: Family Medicine

## 2019-10-18 VITALS — BP 112/60 | HR 61 | Temp 97.8°F | Ht 63.0 in | Wt 141.0 lb

## 2019-10-18 DIAGNOSIS — M79675 Pain in left toe(s): Secondary | ICD-10-CM

## 2019-10-18 DIAGNOSIS — Z23 Encounter for immunization: Secondary | ICD-10-CM | POA: Diagnosis not present

## 2019-10-18 DIAGNOSIS — I152 Hypertension secondary to endocrine disorders: Secondary | ICD-10-CM

## 2019-10-18 DIAGNOSIS — E1159 Type 2 diabetes mellitus with other circulatory complications: Secondary | ICD-10-CM

## 2019-10-18 DIAGNOSIS — I10 Essential (primary) hypertension: Secondary | ICD-10-CM

## 2019-10-18 DIAGNOSIS — R252 Cramp and spasm: Secondary | ICD-10-CM

## 2019-10-18 DIAGNOSIS — E119 Type 2 diabetes mellitus without complications: Secondary | ICD-10-CM

## 2019-10-18 MED ORDER — METFORMIN HCL 1000 MG PO TABS: 1000.0000 mg | ORAL_TABLET | Freq: Every day | ORAL | 1 refills | Status: DC

## 2019-10-18 MED ORDER — BACLOFEN 10 MG PO TABS
10.0000 mg | ORAL_TABLET | Freq: Two times a day (BID) | ORAL | 0 refills | Status: DC | PRN
Start: 2019-10-18 — End: 2020-01-14

## 2019-10-18 NOTE — Patient Instructions (Addendum)
-  for muscle cramping, im checking labs first.  -stop your atorvastatin for 2 weeks and see if muscle cramping goes away. Known to cause cramping.   -if muscle cramping still there, start the atorvastatin back and can trial the muscle relaxer prn (baclofen)   -podiatry referral done for you  -strep pneumonia shot today. (one and done)   -go to pharmacy to inquire about shingles shot. (much cheaper than doing here)   -refilled metformin.

## 2019-10-18 NOTE — Progress Notes (Signed)
Patient: Kristie King MRN: [id number] DOB: [birth year] PCP: Orma Flaming, MD     Subjective:  Chief Complaint  Patient presents with  . Diabetes  . Hypertension  . Anemia    HPI: The patient is a 69 y.o. female who presents today for Diabetes. Pt is requesting a referral to a Podiatrist.     Hypertension Was on medication but I have held this since June and her pressures have been excellent. All to goal at home.   Diabetes Patient is here for follow up of type 2 diabetes. First diagnosed mid 36s.  Currently on the following medications metformin and trulicity. Takes medications as prescribed. Last A1C was 6.4. Currently exercising and following diabetic diet.  Denies any hypoglycemic events. Denies any vision changes, nausea, vomiting, abdominal pain, ulcers/paraesthesia in feet, polyuria, polydipsia or polyphagia. Denies any chest pain, shortness of breath. Only takes metformin 3 days/week.   Leg cramping It comes and goes and happens mainly at night or in the AM. She can sometimes hardly get up and her legs feel so locked. She is on a statin. She states the cramping can last from 15 minutes or less. If she stretches or does some moves it does finally help release the muscles.   Review of Systems  Constitutional: Negative for chills, fatigue and fever.  HENT: Negative for dental problem, ear pain, hearing loss and trouble swallowing.   Eyes: Negative for visual disturbance.  Respiratory: Negative for cough, chest tightness and shortness of breath.   Cardiovascular: Negative for chest pain, palpitations and leg swelling.  Gastrointestinal: Negative for abdominal pain, blood in stool, diarrhea and nausea.  Endocrine: Negative for cold intolerance, polydipsia, polyphagia and polyuria.  Genitourinary: Negative for dysuria and hematuria.  Musculoskeletal: Positive for myalgias. Negative for arthralgias.  Skin: Negative for rash.  Neurological: Negative for dizziness and headaches.   Psychiatric/Behavioral: Negative for dysphoric mood and sleep disturbance. The patient is not nervous/anxious.     Allergies Patient has No Known Allergies.  Past Medical History Patient  has a past medical history of AC (acromioclavicular) arthritis (11/13/2016), Allergy, Cataract, Cervical radiculopathy at C6 (09/10/2016), CKD stage 2 due to type 2 diabetes mellitus ([place]) (04/14/2018), Diabetes mellitus without complication ([place]), History of lumbar laminectomy (2019), HTN (hypertension) (09/17/2012), Hyperlipidemia, Hyperlipidemia associated with type 2 diabetes mellitus ([place]) (09/17/2012), Hypertension associated with diabetes ([place]) (04/14/2018), Iron deficiency anemia (10/16/2016), Osteoarthritis of spine with radiculopathy, cervical region (08/03/2016), and Vitamin D deficiency (10/16/2016).  Surgical History Patient  has a past surgical history that includes Bunionectomy (Right, 02/18/2013); Bunionectomy (Left, 04/01/2013); Back surgery (03/30/2018); and CATARACTS.  Family History [name]'s family history includes Diabetes in her brother and sister; Hyperlipidemia in her sister; Hypertension in her brother and sister; Stroke in her father, maternal grandfather, and mother.  Social History Patient  reports that she quit smoking about 48 years ago. Her smoking use included cigarettes. She quit after 10.00 years of use. She has never used smokeless tobacco. She reports current alcohol use. She reports that she does not use drugs.    Objective: Vitals:   10/18/19 0930  BP: 112/60  Pulse: 61  Temp: 97.8 F (36.6 C)  TempSrc: Temporal  SpO2: 96%  Weight: 141 lb (64 kg)  Height: 5\' 3"  (1.6 m)    Body mass index is 24.98 kg/m.  Physical Exam Vitals reviewed.  Constitutional:      Appearance: She is well-developed.  HENT:     Right Ear: External ear normal.  Left Ear: External ear normal.  Eyes:     Conjunctiva/sclera: Conjunctivae normal.     Pupils: Pupils are equal, round, and  reactive to light.  Neck:     Thyroid: No thyromegaly.  Cardiovascular:     Rate and Rhythm: Normal rate and regular rhythm.     Heart sounds: Normal heart sounds. No murmur heard.   Pulmonary:     Effort: Pulmonary effort is normal.     Breath sounds: Normal breath sounds.  Abdominal:     General: Bowel sounds are normal. There is no distension.     Palpations: Abdomen is soft.     Tenderness: There is no abdominal tenderness.  Musculoskeletal:     Cervical back: Normal range of motion and neck supple.  Lymphadenopathy:     Cervical: No cervical adenopathy.  Skin:    General: Skin is warm and dry.     Findings: No rash.  Neurological:     Mental Status: She is alert and oriented to person, place, and time.     Cranial Nerves: No cranial nerve deficit.     Coordination: Coordination normal.     Deep Tendon Reflexes: Reflexes normal.  Psychiatric:        Behavior: Behavior normal.      Office Visit from 10/18/2019 in [facility]  PHQ-2 Total Score 0         Assessment/plan: 1. Diabetes mellitus without complication ([place]) To goal on last readings. Medication refilled.  -strep pneumonia today -foot exam next visit -continue healthy diet/lifestyle and exercise.  -request podiatry referral today.  -utd on eye exam.  -fu in 6 months.  - metFORMIN (GLUCOPHAGE) 1000 MG tablet; Take 1 tablet (1,000 mg total) by mouth daily with breakfast. Mon, wed fri  Dispense: 90 tablet; Refill: 1 - CBC with Differential/Platelet; Future - Comprehensive metabolic panel; Future - Hemoglobin A1c; Future - Hemoglobin A1c - Comprehensive metabolic panel - CBC with Differential/Platelet  2. Hypertension associated with diabetes ([place]) Has been to goal off medication. Very proud of her. Continue with log at home.   3. Muscle cramping Checking labs and then trial off statin drug x 2 weeks. Increase water, stretching and if no improvement off statin can stat back and  take muscle relaxer as needed.  - TSH; Future - CK (Creatine Kinase); Future - CK (Creatine Kinase) - TSH  4. Need for vaccination for Strep pneumoniae -LEFT BEFORE GETTING>>>>> - Pneumococcal polysaccharide vaccine 23-valent greater than or equal to 2yo subcutaneous/IM  5. Pain of toe of left foot  - Ambulatory referral to Podiatry   This visit occurred during the SARS-CoV-2 public health emergency.  Safety protocols were in place, including screening questions prior to the visit, additional usage of staff PPE, and extensive cleaning of exam room while observing appropriate contact time as indicated for disinfecting solutions.     Return in about 6 months (around 04/19/2020) for diabetes/htn/routine labs. (fasting) .   Orma Flaming, MD [facility]   10/18/2019

## 2019-10-19 LAB — COMPREHENSIVE METABOLIC PANEL
AG Ratio: 1.6 (calc) (ref 1.0–2.5)
ALT: 6 U/L (ref 6–29)
AST: 20 U/L (ref 10–35)
Albumin: 4.2 g/dL (ref 3.6–5.1)
Alkaline phosphatase (APISO): 40 U/L (ref 37–153)
BUN: 19 mg/dL (ref 7–25)
CO2: 30 mmol/L (ref 20–32)
Calcium: 9.6 mg/dL (ref 8.6–10.4)
Chloride: 105 mmol/L (ref 98–110)
Creat: 0.94 mg/dL (ref 0.50–0.99)
Globulin: 2.6 g/dL (calc) (ref 1.9–3.7)
Glucose, Bld: 86 mg/dL (ref 65–99)
Potassium: 3.8 mmol/L (ref 3.5–5.3)
Sodium: 140 mmol/L (ref 135–146)
Total Bilirubin: 0.6 mg/dL (ref 0.2–1.2)
Total Protein: 6.8 g/dL (ref 6.1–8.1)

## 2019-10-19 LAB — CBC WITH DIFFERENTIAL/PLATELET
Absolute Monocytes: 530 cells/uL (ref 200–950)
Basophils Absolute: 31 cells/uL (ref 0–200)
Basophils Relative: 0.6 %
Eosinophils Absolute: 120 cells/uL (ref 15–500)
Eosinophils Relative: 2.3 %
HCT: 32.6 % — ABNORMAL LOW (ref 35.0–45.0)
Hemoglobin: 10.7 g/dL — ABNORMAL LOW (ref 11.7–15.5)
Lymphs Abs: 1654 cells/uL (ref 850–3900)
MCH: 29.8 pg (ref 27.0–33.0)
MCHC: 32.8 g/dL (ref 32.0–36.0)
MCV: 90.8 fL (ref 80.0–100.0)
MPV: 10.7 fL (ref 7.5–12.5)
Monocytes Relative: 10.2 %
Neutro Abs: 2865 cells/uL (ref 1500–7800)
Neutrophils Relative %: 55.1 %
Platelets: 263 10*3/uL (ref 140–400)
RBC: 3.59 10*6/uL — ABNORMAL LOW (ref 3.80–5.10)
RDW: 12 % (ref 11.0–15.0)
Total Lymphocyte: 31.8 %
WBC: 5.2 10*3/uL (ref 3.8–10.8)

## 2019-10-19 LAB — HEMOGLOBIN A1C
Hgb A1c MFr Bld: 5.9 % of total Hgb — ABNORMAL HIGH (ref <5.7)
Mean Plasma Glucose: 123 (calc)
eAG (mmol/L): 6.8 (calc)

## 2019-10-19 LAB — TSH: TSH: 0.67 mIU/L (ref 0.40–4.50)

## 2019-10-19 LAB — CK: Total CK: 58 U/L (ref 29–143)

## 2019-10-25 ENCOUNTER — Other Ambulatory Visit: Payer: Self-pay | Admitting: Family Medicine

## 2019-10-29 ENCOUNTER — Telehealth: Payer: Self-pay | Admitting: Gastroenterology

## 2019-10-29 NOTE — Telephone Encounter (Signed)
Kristie King I did not send medications or letter out to pt. I think this needs to be addressed by the nurse.

## 2019-10-29 NOTE — Telephone Encounter (Signed)
Pt would like to clarify the medications that she was prescribed. Pt states that she checked on Mychart and none of the medications that she has been taking are listed, only Omeprazole is listed. However, she has never taken it. Pt has been taking amoxiciline 500 mg, metronidazole 250 mg, and Cipro 500 mg.  Pt also stated that she received letter with her path results. Letter states that if she experience new rectal bleeding to call the office. However, pt states that she never had rectal bleeding so she is concerned that her medical hx is not being accurately documented. Pt also suggested the letter be signed by physician as "it is impersonal when there is no signature." Finally, pt requested to be addressed properly as Ms. Not Mr. Pls call her.

## 2019-11-01 NOTE — Telephone Encounter (Signed)
Left message on machine to call back  

## 2019-11-02 ENCOUNTER — Other Ambulatory Visit: Payer: Self-pay

## 2019-11-02 MED ORDER — OMEPRAZOLE 40 MG PO CPDR
40.0000 mg | DELAYED_RELEASE_CAPSULE | Freq: Two times a day (BID) | ORAL | 6 refills | Status: DC
Start: 2019-11-02 — End: 2020-04-13

## 2019-11-02 NOTE — Telephone Encounter (Signed)
Pt is requesting a prescription for omeprazole 40mg  to be called in to her pharmacy in order to ensure coverage

## 2019-11-02 NOTE — Telephone Encounter (Signed)
The prescription has been resent as requested.  Pt aware

## 2019-11-02 NOTE — Telephone Encounter (Signed)
I spoke with the pt and advised her that the abx are listed on her med list and also omeprazole was to be taken along with the abx for h pylori.  We discussed the treatment and all her questions answered.  She also was advised that Dr Rush Landmark did write the letter and was mailed out thru our Epic system.  She thanked me for the return call.

## 2019-11-05 ENCOUNTER — Encounter: Payer: Self-pay | Admitting: Family Medicine

## 2019-11-06 IMAGING — XA Imaging study
2 series · 2 of 2 positions shown · non-contrast
Comparison: none

CLINICAL DATA: Spondylosis without myelopathy. Improved following
the previous injections but with persistent pain.

[Series 1: ortho adipose · 1 of 1 slices shown (1 of 2)]
[im 1/1]
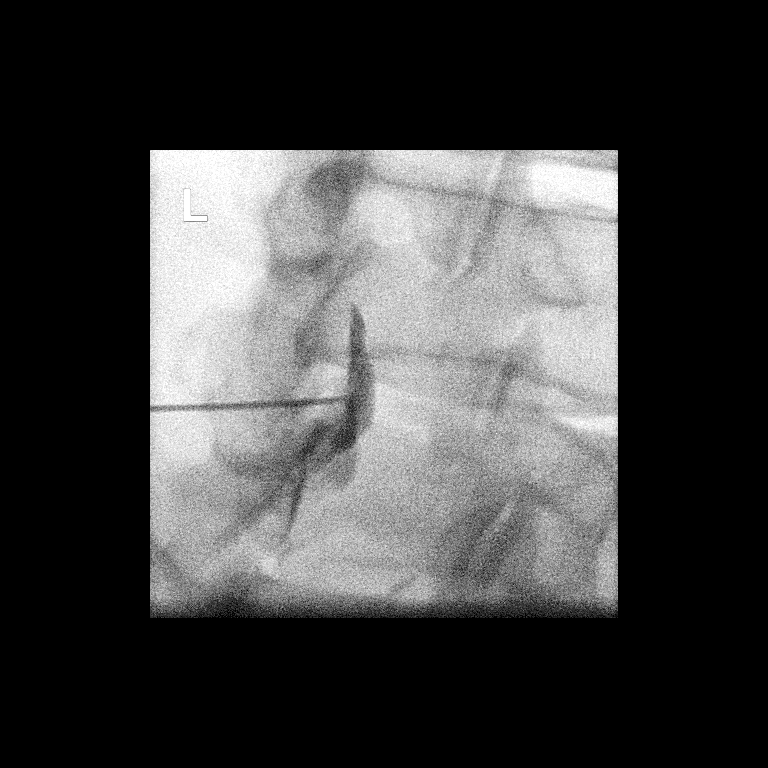

[Series 2: ortho adipose · 1 of 1 slices shown (2 of 2)]
[im 1/1]
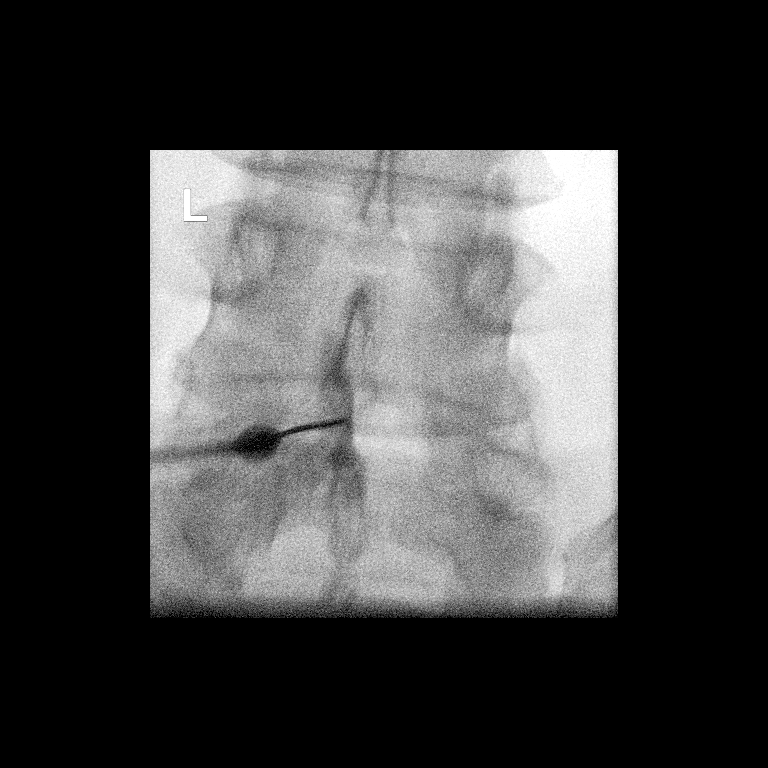

[2 of 2 positions shown; findings below may reference images not displayed]

FLUOROSCOPY TIME:  0 minutes 23 seconds. 16.76 micro gray meter
squared

PROCEDURE:
The procedure, risks, benefits, and alternatives were explained to
the patient. Questions regarding the procedure were encouraged and
answered. The patient understands and consents to the procedure.

LUMBAR EPIDURAL INJECTION:

An interlaminar approach was performed on the left at L4-5. The
overlying skin was cleansed and anesthetized. A 20 gauge epidural
needle was advanced using loss-of-resistance technique.

DIAGNOSTIC EPIDURAL INJECTION:

Injection of Isovue-M 200 shows a good epidural pattern with spread
above and below the level of needle placement, primarily on the the
left, but to both sides. No vascular opacification is seen.

THERAPEUTIC EPIDURAL INJECTION:

One hundred twenty mg of Depo-Medrol mixed with 2 cc 1% lidocaine
were instilled. The procedure was well-tolerated, and the patient
was discharged thirty minutes following the injection in good
condition.

COMPLICATIONS:
None
IMPRESSION: Technically successful epidural injection on the left at L4-5 # 3.

## 2019-11-15 NOTE — Telephone Encounter (Signed)
Spoke with pt this afternoon. Pt states that she has been retaining fluid since she started taking Omeprazole. Pt has been taking Omeprazole 40mg  QD for about 14 days. Pt states that she did not notice increase in fluid retention until starting Omeprazole.Pt has been advised to stop Omeprazole.

## 2019-11-15 NOTE — Telephone Encounter (Signed)
Patient states she is having some type of reaction to the Omeprazole medication

## 2019-11-15 NOTE — Telephone Encounter (Signed)
This particular side effect is not expected with omeprazole. As long as she is completed at least 14 days to complete her H. pylori treatment and I think it is okay to stop and see what she does.  She continues to have fluid retention after the cessation of omeprazole then further evaluation by her primary care doctor for other etiologies of edema/swelling need to be considered. She should reach out to [place] in about a week to let us know where things are and also update her PCP for potential need of follow-up. Thanks. GM

## 2019-11-30 ENCOUNTER — Ambulatory Visit: Payer: Medicare HMO | Admitting: Podiatry

## 2019-11-30 ENCOUNTER — Other Ambulatory Visit: Payer: Self-pay

## 2019-11-30 DIAGNOSIS — M2042 Other hammer toe(s) (acquired), left foot: Secondary | ICD-10-CM | POA: Diagnosis not present

## 2019-11-30 DIAGNOSIS — M2041 Other hammer toe(s) (acquired), right foot: Secondary | ICD-10-CM | POA: Diagnosis not present

## 2019-11-30 DIAGNOSIS — M2012 Hallux valgus (acquired), left foot: Secondary | ICD-10-CM | POA: Diagnosis not present

## 2019-11-30 DIAGNOSIS — M2011 Hallux valgus (acquired), right foot: Secondary | ICD-10-CM

## 2019-11-30 NOTE — Progress Notes (Signed)
  Subjective:  Patient ID: Kristie King, female    DOB: Jul 29, 1950,  MRN: [id number]  Chief Complaint  Patient presents with  . Foot Problem    Pt states bilateral bunionectomy in 2015 and since then her 2nd toes have been moving over her 1st digits. Pt states no pain and is not interested in surgery.    69 y.o. female presents with the above complaint. History confirmed with patient. States that she has no pain or concerns but wanted it checked out.  Objective:  Physical Exam: warm, good capillary refill, no trophic changes or ulcerative lesions, normal DP and PT pulses and normal sensory exam. Left Foot: Well corrected bunion deformity but with slight hallux valgus deformity, 2nd toe hammertoe Right Foot:Well corrected bunion deformity but with slight hallux valgus deformity, 2nd toe hammertoe  Assessment:   1. Acquired hallux valgus of both feet   2. Hammertoe, bilateral      Plan:  Patient was evaluated and treated and all questions answered.  Hallux Valgus, Hammertoe bilat -Not interested in surgery, hold XR today. -Dicussed ROM left -Hammertoe crest pads dispensed -F/u should these hurt and patient desire surgical intervention.  Return if symptoms worsen or fail to improve.

## 2019-12-03 ENCOUNTER — Ambulatory Visit: Payer: Medicare HMO | Admitting: Gastroenterology

## 2019-12-13 ENCOUNTER — Telehealth: Payer: Self-pay

## 2019-12-13 ENCOUNTER — Other Ambulatory Visit: Payer: Medicare HMO

## 2019-12-13 DIAGNOSIS — Z20822 Contact with and (suspected) exposure to covid-19: Secondary | ICD-10-CM | POA: Diagnosis not present

## 2019-12-13 NOTE — Telephone Encounter (Signed)
Pt is experiencing cough, hoarse voice, sore throat, and runny nose. She is wanting to see Dr. Rogers Blocker for medication. Pt is scheduled for a covid test at our location tonight. Please advise how to schedule.

## 2019-12-13 NOTE — Telephone Encounter (Signed)
If negative we can put in same day on Thursday or schedule a virtual on Thursday.  Dr. Rogers Blocker

## 2019-12-14 LAB — SARS-COV-2, NAA 2 DAY TAT

## 2019-12-14 LAB — NOVEL CORONAVIRUS, NAA: SARS-CoV-2, NAA: NOT DETECTED

## 2019-12-15 ENCOUNTER — Other Ambulatory Visit: Payer: Self-pay

## 2019-12-15 ENCOUNTER — Ambulatory Visit (INDEPENDENT_AMBULATORY_CARE_PROVIDER_SITE_OTHER): Payer: Medicare HMO | Admitting: Family Medicine

## 2019-12-15 ENCOUNTER — Encounter: Payer: Self-pay | Admitting: Family Medicine

## 2019-12-15 VITALS — BP 144/85 | HR 75 | Temp 97.7°F | Ht 63.0 in | Wt 143.2 lb

## 2019-12-15 DIAGNOSIS — R0982 Postnasal drip: Secondary | ICD-10-CM | POA: Diagnosis not present

## 2019-12-15 DIAGNOSIS — J069 Acute upper respiratory infection, unspecified: Secondary | ICD-10-CM

## 2019-12-15 MED ORDER — GUAIFENESIN-CODEINE 100-10 MG/5ML PO SOLN
10.0000 mL | Freq: Three times a day (TID) | ORAL | 0 refills | Status: DC | PRN
Start: 2019-12-15 — End: 2020-04-13

## 2019-12-15 NOTE — Patient Instructions (Signed)
Viral upper respiratory infection vs. Allergies. Vs,. Post nasal drip.   -start flonase (flucticasone) at night. Spray toward ear when you snort it in. Will help with post nasal drip -could take a zytec daily  -robitussin DM during day if you want, but if zarbees is working then continue that -nyquil at night if you want   If you feel like you need cough medication I can send in stuff, just email me, but I think you are doing well!  You look great! Dr. Rogers Blocker

## 2019-12-15 NOTE — Telephone Encounter (Signed)
Pt has negative covid test. LVM to call and schedule an appt with Dr Rogers Blocker. Did NOT leave results on voicemail

## 2019-12-15 NOTE — Progress Notes (Signed)
Patient: Kristie King MRN: [id number] DOB: 01-Jan-1951 PCP: Orma Flaming, MD     Subjective:  Chief Complaint  Patient presents with  . Cough  . Nasal Congestion  . Covid Exposure    NEGATIVE TEST RESULTS    HPI: The patient is a 69 y.o. female who presents today for sore throat and cough. She was recently on a trip to [place] for 8 days and came back last Friday. One of the girls had a cold on the trip and she sat by the girl who took care of her. [name] started to feel bad last Saturday. She started with a sore throat then a runny nose.  She also complained of a dry cough. She has been taking Zarbees cough syrup. She thinks this has helped some. She has also drank hot tea and honey.  She took a covid test on Monday and it was negative. She is fully vaccinated.   She is feeling better and even went to the gym yesterday. She also ran errands all day yesterday, cleaned and cooked. She already had this appointment scheduled. She states she isn't really coughing and mainly only coughs at night. Her nose really isn't runny anymore. No fever/chills, no shortness of breath or wheezing. Her sore throat is also nearly resolved.   Review of Systems  Constitutional: Negative for chills, fatigue and fever.  HENT: Positive for congestion and sore throat. Negative for sinus pressure and sinus pain.   Respiratory: Positive for cough. Negative for chest tightness, shortness of breath and wheezing.   Cardiovascular: Negative for chest pain, palpitations and leg swelling.  Gastrointestinal: Negative for abdominal pain, diarrhea and nausea.  Neurological: Negative for dizziness and light-headedness.    Allergies Patient has No Known Allergies.  Past Medical History Patient  has a past medical history of AC (acromioclavicular) arthritis (11/13/2016), Allergy, Cataract, Cervical radiculopathy at C6 (09/10/2016), CKD stage 2 due to type 2 diabetes mellitus ([place]) (04/14/2018), Diabetes mellitus  without complication ([place]), History of lumbar laminectomy (2019), HTN (hypertension) (09/17/2012), Hyperlipidemia, Hyperlipidemia associated with type 2 diabetes mellitus ([place]) (09/17/2012), Hypertension associated with diabetes ([place]) (04/14/2018), Iron deficiency anemia (10/16/2016), Osteoarthritis of spine with radiculopathy, cervical region (08/03/2016), and Vitamin D deficiency (10/16/2016).  Surgical History Patient  has a past surgical history that includes Bunionectomy (Right, 02/18/2013); Bunionectomy (Left, 04/01/2013); Back surgery (03/30/2018); and CATARACTS.  Family History [name]'s family history includes Diabetes in her brother and sister; Hyperlipidemia in her sister; Hypertension in her brother and sister; Stroke in her father, maternal grandfather, and mother.  Social History Patient  reports that she quit smoking about 48 years ago. Her smoking use included cigarettes. She quit after 10.00 years of use. She has never used smokeless tobacco. She reports current alcohol use. She reports that she does not use drugs.    Objective: Vitals:   12/15/19 1306  BP: (!) 144/85  Pulse: 75  Temp: 97.7 F (36.5 C)  TempSrc: Temporal  SpO2: 93%  Weight: 143 lb 3.2 oz (65 kg)  Height: 5\' 3"  (1.6 m)    Body mass index is 25.37 kg/m.  Physical Exam Vitals reviewed.  Constitutional:      Appearance: She is well-developed and normal weight.  HENT:     Head: Normocephalic and atraumatic.     Comments: No ttp over sinuses     Right Ear: Tympanic membrane, ear canal and external ear normal.     Left Ear: Tympanic membrane, ear canal and external ear normal.  Nose: Nose normal.  Eyes:     Extraocular Movements: Extraocular movements intact.     Conjunctiva/sclera: Conjunctivae normal.     Pupils: Pupils are equal, round, and reactive to light.  Neck:     Thyroid: No thyromegaly.     Vascular: No carotid bruit.  Cardiovascular:     Rate and Rhythm: Normal rate and regular rhythm.      Heart sounds: Normal heart sounds. No murmur heard.   Pulmonary:     Effort: Pulmonary effort is normal.     Breath sounds: Normal breath sounds.  Abdominal:     General: Abdomen is flat. Bowel sounds are normal. There is no distension.     Palpations: Abdomen is soft.     Tenderness: There is no abdominal tenderness.  Musculoskeletal:     Cervical back: Normal range of motion and neck supple.  Lymphadenopathy:     Cervical: No cervical adenopathy.  Skin:    General: Skin is warm and dry.     Findings: No rash.  Neurological:     General: No focal deficit present.     Mental Status: She is alert and oriented to person, place, and time.     Cranial Nerves: No cranial nerve deficit.     Coordination: Coordination normal.     Deep Tendon Reflexes: Reflexes normal.  Psychiatric:        Mood and Affect: Mood normal.        Behavior: Behavior normal.        Assessment/plan:  1. Viral URI Supportive care. Already improving and no indication for abx. Suggested cool mist humidifier, flonase, honey and can do robitussin DM during day. Requesting cough syrup at night and will codeine prn at night. Drowsy precautions given.   2. PND (post-nasal drip) flonase nightly. Instructed on how to use.   Let me know if not getting better.    This visit occurred during the SARS-CoV-2 public health emergency.  Safety protocols were in place, including screening questions prior to the visit, additional usage of staff PPE, and extensive cleaning of exam room while observing appropriate contact time as indicated for disinfecting solutions.      Return if symptoms worsen or fail to improve.   Orma Flaming, MD [place]   12/15/2019

## 2019-12-20 ENCOUNTER — Telehealth: Payer: Self-pay

## 2019-12-20 ENCOUNTER — Encounter: Payer: Self-pay | Admitting: Family Medicine

## 2019-12-20 NOTE — Telephone Encounter (Signed)
Can you please see if she is using the flonase nasal spray?  If yes, I will send in a px nasal spray for her to use in the AM Thanks!  Dr. Rogers Blocker

## 2019-12-20 NOTE — Telephone Encounter (Signed)
Patient is calling in stating she seen Dr.Wolfe last week, she was prescribed cough medicine which has helped but her nose has not stopped running, wondered if Dr.Wolfe would prescribe something to help.

## 2019-12-21 NOTE — Telephone Encounter (Signed)
Pt called in stating she is still using the Flonase and her nose is still running.

## 2019-12-21 NOTE — Telephone Encounter (Signed)
LVM asking patient to give the office a call back.

## 2019-12-22 MED ORDER — AZELASTINE HCL 0.1 % NA SOLN: 1.0000 | Freq: Every morning | NASAL | 1 refills | Status: DC

## 2019-12-22 NOTE — Telephone Encounter (Signed)
Called pt to give message below. Pt will pick up nasal when returning from out of town. She agrees with plan.

## 2019-12-22 NOTE — Telephone Encounter (Signed)
Let her know im sending in another nasal spray to use in the AM. It's called astelin. Continue flonase at night.  Dr. Rogers Blocker

## 2019-12-22 NOTE — Addendum Note (Signed)
Addended by: Orma Flaming on: 12/22/2019 12:27 PM   Modules accepted: Orders

## 2020-01-10 ENCOUNTER — Other Ambulatory Visit: Payer: Self-pay | Admitting: Family Medicine

## 2020-01-13 ENCOUNTER — Other Ambulatory Visit: Payer: Self-pay | Admitting: Family Medicine

## 2020-01-13 ENCOUNTER — Telehealth: Payer: Self-pay

## 2020-01-13 NOTE — Telephone Encounter (Signed)
. °  LAST APPOINTMENT DATE: 01/13/2020   NEXT APPOINTMENT DATE:@2 /16/2022  MEDICATION:baclofen (LIORESAL) 10 MG tablet  PHARMACY:[id number] - [place], [place] - [address]  **Let patient know to contact pharmacy at the end of the day to make sure medication is ready. **  ** Please notify patient to allow 48-72 hours to process**  **Encourage patient to contact the pharmacy for refills or they can request refills through [facility]**  CLINICAL FILLS OUT ALL BELOW:   LAST REFILL:  QTY:  REFILL DATE:    OTHER COMMENTS:  Patient states she's have muscle spasm / arthritis again    Okay for refill?  Please advise

## 2020-01-14 ENCOUNTER — Other Ambulatory Visit: Payer: Self-pay

## 2020-01-14 MED ORDER — BACLOFEN 10 MG PO TABS: 10.0000 mg | ORAL_TABLET | Freq: Two times a day (BID) | ORAL | 0 refills | Status: DC | PRN

## 2020-01-14 NOTE — Telephone Encounter (Signed)
Sent to pharmacy 

## 2020-01-19 ENCOUNTER — Encounter: Payer: Self-pay | Admitting: Family Medicine

## 2020-01-19 ENCOUNTER — Ambulatory Visit (INDEPENDENT_AMBULATORY_CARE_PROVIDER_SITE_OTHER): Payer: Medicare HMO | Admitting: Family Medicine

## 2020-01-19 ENCOUNTER — Other Ambulatory Visit: Payer: Self-pay

## 2020-01-19 VITALS — BP 106/71 | HR 66 | Temp 97.2°F | Ht 63.0 in | Wt 139.0 lb

## 2020-01-19 DIAGNOSIS — M62838 Other muscle spasm: Secondary | ICD-10-CM

## 2020-01-19 MED ORDER — METHYLPREDNISOLONE ACETATE 80 MG/ML IJ SUSP
80.0000 mg | Freq: Once | INTRAMUSCULAR | Status: AC
  Administered 2020-01-19: 80 mg via INTRAMUSCULAR

## 2020-01-19 NOTE — Progress Notes (Signed)
Patient: Kristie King MRN: [id number] DOB: June 04, 1950 PCP: Orma Flaming, MD     Subjective:  Chief Complaint  Patient presents with   Neck Pain    HPI: The patient is a 69 y.o. female who presents today for recurrent neck pain. She says that last Monday was a full week. She woke up and couldn't turn it. 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  She has used Voltaren gel. She has used Flexeril for the past 2-3 days and says that it has helped. She states it is better and is able to turn her head left and right, but still has some pain. She states it is much better though. No tingling or weakness in her arms. She states pain is 7/10. She would really like something to help this quickly as she has a lot to get done.   Review of Systems  Constitutional: Negative for chills, diaphoresis and fever.  HENT: Negative for ear pain and facial swelling.   Musculoskeletal: Positive for neck pain. Negative for neck stiffness.  Neurological: Negative for dizziness, weakness, numbness and headaches.    Allergies Patient has No Known Allergies.  Past Medical History Patient  has a past medical history of AC (acromioclavicular) arthritis (11/13/2016), Allergy, Cataract, Cervical radiculopathy at C6 (09/10/2016), CKD stage 2 due to type 2 diabetes mellitus ([place]) (04/14/2018), Diabetes mellitus without complication ([place]), History of lumbar laminectomy (2019), HTN (hypertension) (09/17/2012), Hyperlipidemia, Hyperlipidemia associated with type 2 diabetes mellitus ([place]) (09/17/2012), Hypertension associated with diabetes ([place]) (04/14/2018), Iron deficiency anemia (10/16/2016), Osteoarthritis of spine with radiculopathy, cervical region (08/03/2016), and Vitamin D deficiency (10/16/2016).  Surgical History Patient  has a past surgical history that includes Bunionectomy (Right, 02/18/2013); Bunionectomy (Left, 04/01/2013); Back surgery (03/30/2018); and CATARACTS.  Family History [name]'s family history  includes Diabetes in her brother and sister; Hyperlipidemia in her sister; Hypertension in her brother and sister; Stroke in her father, maternal grandfather, and mother.  Social History Patient  reports that she quit smoking about 49 years ago. Her smoking use included cigarettes. She quit after 10.00 years of use. She has never used smokeless tobacco. She reports current alcohol use. She reports that she does not use drugs.    Objective: Vitals:   01/19/20 1301  BP: 106/71  Pulse: 66  Temp: (!) 97.2 F (36.2 C)  TempSrc: Temporal  SpO2: 100%  Weight: 139 lb (63 kg)  Height: 5\' 3"  (1.6 m)    Body mass index is 24.62 kg/m.  Physical Exam Vitals reviewed.  Constitutional:      Appearance: Normal appearance. She is normal weight.  HENT:     Head: Normocephalic and atraumatic.  Eyes:     Extraocular Movements: Extraocular movements intact.     Conjunctiva/sclera: Conjunctivae normal.     Pupils: Pupils are equal, round, and reactive to light.  Neck:     Comments: She is able to do chin to chest and extend back. Pain and decreased ROM with right ear to shoulder. Much more restricted ROM with left ear to shoulder. Able to turn her head side to side.  Cardiovascular:     Rate and Rhythm: Normal rate and regular rhythm.     Heart sounds: Normal heart sounds.  Pulmonary:     Effort: Pulmonary effort is normal.     Breath sounds: Normal breath sounds.  Musculoskeletal:     Cervical back: Neck supple. Tenderness present.  Lymphadenopathy:     Cervical: No cervical adenopathy.  Neurological:  General: No focal deficit present.     Mental Status: She is alert and oriented to person, place, and time.  Psychiatric:        Mood and Affect: Mood normal.        Behavior: Behavior normal.        Assessment/plan: 1. Neck muscle spasm Hand out given with exercises and discussed stretching is the best thing she can do. If she can get massage may help -ice or heat -discussed  oral NSAIDs or can continue voltaren gel -continue muscle relaxer prn -will do steroid shot today, but discussed typically I do PT. Since she has a lot going on and traveling with holiday will do injection today. If not getting better she is to let me know so we can send to PT.  - methylPREDNISolone acetate (DEPO-MEDROL) injection 80 mg  This visit occurred during the SARS-CoV-2 public health emergency.  Safety protocols were in place, including screening questions prior to the visit, additional usage of staff PPE, and extensive cleaning of exam room while observing appropriate contact time as indicated for disinfecting solutions.      Return if symptoms worsen or fail to improve.   Orma Flaming, MD [place]   01/19/2020

## 2020-01-19 NOTE — Patient Instructions (Signed)
-  continue voltaren gel or can do oral ibuprofen -would do heating pad -start exercises im giving you -steroid shot today.  -if not getting better please let me know so we can send you to physical therapy!   Happy birthday! Dr. Rogers Blocker

## 2020-02-04 ENCOUNTER — Other Ambulatory Visit: Payer: Self-pay | Admitting: Family Medicine

## 2020-02-15 DIAGNOSIS — Z1231 Encounter for screening mammogram for malignant neoplasm of breast: Secondary | ICD-10-CM | POA: Diagnosis not present

## 2020-02-15 LAB — HM MAMMOGRAPHY

## 2020-02-24 ENCOUNTER — Telehealth: Payer: Self-pay

## 2020-02-24 MED ORDER — CYCLOBENZAPRINE HCL 10 MG PO TABS: 10.0000 mg | ORAL_TABLET | Freq: Three times a day (TID) | ORAL | 0 refills | Status: DC | PRN

## 2020-02-24 NOTE — Telephone Encounter (Signed)
  LAST APPOINTMENT DATE: 02/04/2020   NEXT APPOINTMENT DATE:@2 /16/2022  MEDICATION: Flexeril  PHARMACY: [id number] - [place], [place] - [address]  Comments: Patient is completely out   Please advise

## 2020-03-07 ENCOUNTER — Telehealth (INDEPENDENT_AMBULATORY_CARE_PROVIDER_SITE_OTHER): Payer: Medicare HMO | Admitting: Family Medicine

## 2020-03-07 DIAGNOSIS — H01112 Allergic dermatitis of right lower eyelid: Secondary | ICD-10-CM | POA: Diagnosis not present

## 2020-03-07 DIAGNOSIS — H01114 Allergic dermatitis of left upper eyelid: Secondary | ICD-10-CM | POA: Diagnosis not present

## 2020-03-07 DIAGNOSIS — H5789 Other specified disorders of eye and adnexa: Secondary | ICD-10-CM | POA: Diagnosis not present

## 2020-03-07 DIAGNOSIS — H02849 Edema of unspecified eye, unspecified eyelid: Secondary | ICD-10-CM

## 2020-03-07 DIAGNOSIS — H01115 Allergic dermatitis of left lower eyelid: Secondary | ICD-10-CM | POA: Diagnosis not present

## 2020-03-07 DIAGNOSIS — H01111 Allergic dermatitis of right upper eyelid: Secondary | ICD-10-CM | POA: Diagnosis not present

## 2020-03-07 NOTE — Progress Notes (Signed)
Virtual Visit via Video Note  I connected with Kristie King  on 03/07/20 at 10:40 AM EST by a video enabled telemedicine application and verified that I am speaking with the correct person using two identifiers.  Location patient: home, [place] Location provider:work or home office Persons participating in the virtual visit: patient, provider  I discussed the limitations of evaluation and management by telemedicine and the availability of in person appointments. The patient expressed understanding and agreed to proceed.   HPI:  Acute telemedicine visit for eye issues: -Onset: 3 days ago -Symptoms include: bilateral eye irritated and a little red, a little clear drainage, eyelids a little itchy and puffy -Denies:fevers, vision loss, vision issues today, pain, purulent discharge, sickness otherwise -Has tried:dry eye  -can't think of new exposures, but weather was very warm and was out  ROS: See pertinent positives and negatives per HPI.  Past Medical History:  Diagnosis Date  . AC (acromioclavicular) arthritis 11/13/2016  . Allergy    SEASONAL  . Cataract    BILATERAL-REMOVED  . Cervical radiculopathy at C6 09/10/2016  . CKD stage 2 due to type 2 diabetes mellitus (Kristie King) 04/14/2018  . Diabetes mellitus without complication (Kristie King)   . History of lumbar laminectomy 2019  . HTN (hypertension) 09/17/2012  . Hyperlipidemia   . Hyperlipidemia associated with type 2 diabetes mellitus ([place]) 09/17/2012  . Hypertension associated with diabetes (Kristie King) 04/14/2018  . Iron deficiency anemia 10/16/2016  . Osteoarthritis of spine with radiculopathy, cervical region 08/03/2016  . Vitamin D deficiency 10/16/2016    Past Surgical History:  Procedure Laterality Date  . BACK SURGERY  03/30/2018  . BUNIONECTOMY Right 02/18/2013  . BUNIONECTOMY Left 04/01/2013  . CATARACTS       Current Outpatient Medications:  .  azelastine (ASTELIN) 0.1 % nasal spray, PLACE 1 SPRAY INTO BOTH NOSTRILS IN THE MORNING. USE IN EACH  NOSTRIL AS DIRECTED, Disp: 30 mL, Rfl: 1 .  cyclobenzaprine (FLEXERIL) 10 MG tablet, Take 1 tablet (10 mg total) by mouth 3 (three) times daily as needed for muscle spasms., Disp: 30 tablet, Rfl: 0 .  furosemide (LASIX) 40 MG tablet, Take 40 mg by mouth daily., Disp: , Rfl:  .  guaiFENesin-codeine 100-10 MG/5ML syrup, Take 10 mLs by mouth 3 (three) times daily as needed for cough., Disp: 120 mL, Rfl: 0 .  metFORMIN (GLUCOPHAGE) 1000 MG tablet, Take 1 tablet (1,000 mg total) by mouth daily with breakfast. Mon, wed fri, Disp: 90 tablet, Rfl: 1 .  nystatin (MYCOSTATIN) 100000 UNIT/ML suspension, Take by mouth every hour as needed., Disp: , Rfl:  .  omeprazole (PRILOSEC) 40 MG capsule, Take 1 capsule (40 mg total) by mouth in the morning and at bedtime., Disp: 60 capsule, Rfl: 6 .  OVER THE COUNTER MEDICATION, Kristie King dieters laxative tea ( senna leaf and mellow tea ) (Patient not taking: Reported on 01/19/2020), Disp: , Rfl:  .  traZODone (DESYREL) 50 MG tablet, TAKE 1/2 TO 1 TABLET BY MOUTH AT BEDTIME AS NEEDED FOR SLEEP, Disp: 90 tablet, Rfl: 1 .  TRULICITY 1.5 0000000 SOPN, INJECT 0.5 MLS INTO THE SKIN ONCE A WEEK., Disp: 4 pen, Rfl: 3  EXAM:  VITALS per patient if applicable:  GENERAL: alert, oriented, appears well and in no acute distress  HEENT: atraumatic, conjunttiva clear, no obvious abnormalities on inspection of external nose and ears  NECK: normal movements of the head and neck  LUNGS: on inspection no signs of respiratory distress, breathing rate appears normal, no obvious  gross SOB, gasping or wheezing  CV: no obvious cyanosis  MS: moves all visible extremities without noticeable abnormality  PSYCH/NEURO: pleasant and cooperative, no obvious depression or anxiety, speech and thought processing grossly intact  ASSESSMENT AND PLAN:  Discussed the following assessment and plan:  Eye irritation  Edema of eyelid, unspecified laterality  -we discussed possible serious  and likely etiologies, options for evaluation and workup, limitations of telemedicine visit vs in person visit, treatment, treatment risks and precautions. Pt prefers to treat via telemedicine empirically rather than in person at this moment.  Given bilateral and more irritative symptoms rather than pain no purulent discharge, suspect possible allergic etiology versus other.  Opted to try a 1 week course of antihistamine, Allegra or Zyrtec once daily, and allergy eyedrops.  Cool compresses. Advised to seek prompt in person care if worsening, new symptoms arise, or if is not improving with treatment. Discussed options for inperson care if PCP office not available. Did let this patient know that I only do telemedicine on Tuesdays and Thursdays for . Advised to schedule follow up visit with PCP or [facility] if any further questions or concerns to avoid delays in care.   I discussed the assessment and treatment plan with the patient. The patient was provided an opportunity to ask questions and all were answered. The patient agreed with the plan and demonstrated an understanding of the instructions.     Kristie Koyanagi, DO

## 2020-03-07 NOTE — Patient Instructions (Signed)
Take Allegra once daily for 1 week  Cool compresses 1-2 times daily  Allergy eyedrops per instructions   I hope you are feeling better soon!  Seek in person care promptly if your symptoms worsen, new concerns arise or you are not improving with treatment.  It was nice to meet you today. I help [facility] out with telemedicine visits on Tuesdays and Thursdays and am available for visits on those days. If you have any concerns or questions following this visit please schedule a follow up visit with your Primary Care doctor or seek care at a local urgent care clinic to avoid delays in care.

## 2020-03-14 ENCOUNTER — Encounter: Payer: Self-pay | Admitting: Family Medicine

## 2020-03-30 DIAGNOSIS — H01111 Allergic dermatitis of right upper eyelid: Secondary | ICD-10-CM | POA: Diagnosis not present

## 2020-03-30 DIAGNOSIS — H10812 Pingueculitis, left eye: Secondary | ICD-10-CM | POA: Diagnosis not present

## 2020-03-30 DIAGNOSIS — H01114 Allergic dermatitis of left upper eyelid: Secondary | ICD-10-CM | POA: Diagnosis not present

## 2020-03-30 DIAGNOSIS — H04123 Dry eye syndrome of bilateral lacrimal glands: Secondary | ICD-10-CM | POA: Diagnosis not present

## 2020-04-11 ENCOUNTER — Telehealth: Payer: Self-pay

## 2020-04-11 NOTE — Telephone Encounter (Signed)
Pt called in stating she had accidentally injected her Trulicity into her Thumb instead of her thigh. Pt states that her thumb is now puffy. Asked Melitta for advice. Melitta said to advise pt to monitor her finger and if her pain increases to follow up in the ED and she can also see a PA here tomorrow. Relayed message to pt. Pt verbalized understanding and states she will call and schedule with [place] in the morning if she feels like she needs to.

## 2020-04-12 NOTE — Telephone Encounter (Signed)
FYI

## 2020-04-13 ENCOUNTER — Encounter: Payer: Self-pay | Admitting: Hematology and Oncology

## 2020-04-13 ENCOUNTER — Telehealth: Payer: Self-pay | Admitting: Hematology and Oncology

## 2020-04-13 ENCOUNTER — Inpatient Hospital Stay: Payer: Medicare HMO | Attending: Hematology and Oncology | Admitting: Hematology and Oncology

## 2020-04-13 ENCOUNTER — Other Ambulatory Visit: Payer: Self-pay

## 2020-04-13 ENCOUNTER — Inpatient Hospital Stay: Payer: Medicare HMO

## 2020-04-13 DIAGNOSIS — D869 Sarcoidosis, unspecified: Secondary | ICD-10-CM | POA: Insufficient documentation

## 2020-04-13 DIAGNOSIS — E119 Type 2 diabetes mellitus without complications: Secondary | ICD-10-CM | POA: Insufficient documentation

## 2020-04-13 DIAGNOSIS — D539 Nutritional anemia, unspecified: Secondary | ICD-10-CM | POA: Insufficient documentation

## 2020-04-13 LAB — CBC WITH DIFFERENTIAL/PLATELET
Abs Immature Granulocytes: 0.02 10*3/uL (ref 0.00–0.07)
Basophils Absolute: 0 10*3/uL (ref 0.0–0.1)
Basophils Relative: 1 %
Eosinophils Absolute: 0.2 10*3/uL (ref 0.0–0.5)
Eosinophils Relative: 3 %
HCT: 34.5 % — ABNORMAL LOW (ref 36.0–46.0)
Hemoglobin: 10.9 g/dL — ABNORMAL LOW (ref 12.0–15.0)
Immature Granulocytes: 0 %
Lymphocytes Relative: 42 %
Lymphs Abs: 2.5 10*3/uL (ref 0.7–4.0)
MCH: 28.9 pg (ref 26.0–34.0)
MCHC: 31.6 g/dL (ref 30.0–36.0)
MCV: 91.5 fL (ref 80.0–100.0)
Monocytes Absolute: 0.6 10*3/uL (ref 0.1–1.0)
Monocytes Relative: 11 %
Neutro Abs: 2.6 10*3/uL (ref 1.7–7.7)
Neutrophils Relative %: 43 %
Platelets: 304 10*3/uL (ref 150–400)
RBC: 3.77 MIL/uL — ABNORMAL LOW (ref 3.87–5.11)
RDW: 13.2 % (ref 11.5–15.5)
WBC: 6 10*3/uL (ref 4.0–10.5)
nRBC: 0 % (ref 0.0–0.2)

## 2020-04-13 LAB — IRON AND TIBC
Iron: 129 ug/dL (ref 41–142)
Saturation Ratios: 43 % (ref 21–57)
TIBC: 303 ug/dL (ref 236–444)
UIBC: 173 ug/dL (ref 120–384)

## 2020-04-13 LAB — FERRITIN: Ferritin: 127 ng/mL (ref 11–307)

## 2020-04-13 NOTE — Progress Notes (Signed)
[facility] OFFICE PROGRESS NOTE  Orma Flaming, MD  ASSESSMENT & PLAN:  Deficiency anemia After significant evaluation, her overall cause of anemia is multifactorial, likely anemia of chronic illness related to her diabetes as well as her sarcoidosis She does not require treatment At hemoglobin greater than 10, typically, it should not affect her energy level or cause symptoms The patient is very active, in fact, after her appointments, she will be going for her Zumba class which she enjoys I plan to see her once a year for further follow-up If her hemoglobin drops to less than 10, we can consider further work-up and treatment for anemia She is in agreement with the plan of care   No orders of the defined types were placed in this encounter.   The total time spent in the appointment was 20 minutes encounter with patients including review of chart and various tests results, discussions about plan of care and coordination of care plan   All questions were answered. The patient knows to call the clinic with any problems, questions or concerns. No barriers to learning was detected.    Heath Lark, MD 2/10/202211:43 AM  INTERVAL HISTORY: Kristie King 70 y.o. female returns for follow-up on anemia She is feeling well She is active with all activities of daily living In fact, she goes to the gym and attend summer classes on a regular basis Denies recent chest pain, dizziness or shortness of breath The patient denies any recent signs or symptoms of bleeding such as spontaneous epistaxis, hematuria or hematochezia.  SUMMARY OF HEMATOLOGIC HISTORY:  She was found to have abnormal CBC from CBC monitoring with her primary care doctor I have the opportunity to review his CBC dated back to 2014 Her hemoglobin typically range between 10.5-12.2 She denies recent chest pain on exertion, shortness of breath on minimal exertion, pre-syncopal episodes, or palpitations. She had not  noticed any recent bleeding such as epistaxis, hematuria or hematochezia The patient denies over the counter NSAID ingestion. She is not on antiplatelets agents. Her last colonoscopy was in 2018 and it was reported as normal but she is scheduled for another repeat colonoscopy next month She had no prior history or diagnosis of cancer. Her age appropriate screening programs are up-to-date. She denies any pica and eats a variety of diet. She never donated blood or received blood transfusion The patient was prescribed oral iron supplements and she takes iron twice a day usually around her mealtime She has occasional nausea She have intermittent chronic constipation She has history of sarcoidosis but she does not take chronic prednisone therapy She has been very active since retirement She has gained almost 10 pounds since COVID-19 restriction and is looking forward to exercising at the gym again On 09/28/2019: EGD showed No gross lesions in esophagus. - 2 cm hiatal hernia. - Erythematous mucosa in the gastric body. No other gross lesions in the stomach. Biopsied On 09/28/2019, colonoscopy showed  Hemorrhoids found on digital rectal exam. - Melanosis in the colon. - One 5 mm polyp at the hepatic flexure, removed with a cold snare. Resected and retrieved. - Non-bleeding non-thrombosed external and internal hemorrhoids. BIOPSY from 09/28/2019 1. Surgical [P], duodenal biopsies - BENIGN DUODENAL MUCOSA. - NO FEATURES OF CELIAC SPRUE OR GRANULOMAS. - GASTRIC FRAGMENTS WITH MILDLY ACTIVE CHRONIC GASTRITIS (HELICOBACTER PYLORI TYPE). 2. Surgical [P], gastric antrum and gastric body - GASTRIC MUCOSA WITH MILDLY ACTIVE CHRONIC HELICOBACTER PYLORI GASTRITIS. - WARTHIN-STARRY POSITIVE FOR HELICOBACTER PYLORI. - NO  INTESTINAL METAPLASIA, DYSPLASIA OR CARCINOMA. 3. Surgical [P], colon, hepatic flexure, polyp - TUBULAR ADENOMA(S). - NO HIGH GRADE DYSPLASIA OR CARCINOMA.  I have reviewed the past  medical history, past surgical history, social history and family history with the patient and they are unchanged from previous note.  ALLERGIES:  has No Known Allergies.  MEDICATIONS:  Current Outpatient Medications  Medication Sig Dispense Refill  . azelastine (ASTELIN) 0.1 % nasal spray PLACE 1 SPRAY INTO BOTH NOSTRILS IN THE MORNING. USE IN EACH NOSTRIL AS DIRECTED 30 mL 1  . cyclobenzaprine (FLEXERIL) 10 MG tablet Take 1 tablet (10 mg total) by mouth 3 (three) times daily as needed for muscle spasms. 30 tablet 0  . furosemide (LASIX) 40 MG tablet Take 40 mg by mouth daily.    . metFORMIN (GLUCOPHAGE) 1000 MG tablet Take 1 tablet (1,000 mg total) by mouth daily with breakfast. Mon, wed fri 90 tablet 1  . traZODone (DESYREL) 50 MG tablet TAKE 1/2 TO 1 TABLET BY MOUTH AT BEDTIME AS NEEDED FOR SLEEP 90 tablet 1  . TRULICITY 1.5 OZ/3.6UY SOPN INJECT 0.5 MLS INTO THE SKIN ONCE A WEEK. 4 pen 3   No current facility-administered medications for this visit.     REVIEW OF SYSTEMS:   Constitutional: Denies fevers, chills or night sweats Eyes: Denies blurriness of vision Ears, nose, mouth, throat, and face: Denies mucositis or sore throat Respiratory: Denies cough, dyspnea or wheezes Cardiovascular: Denies palpitation, chest discomfort or lower extremity swelling Gastrointestinal:  Denies nausea, heartburn or change in bowel habits Skin: Denies abnormal skin rashes Lymphatics: Denies new lymphadenopathy or easy bruising Neurological:Denies numbness, tingling or new weaknesses Behavioral/Psych: Mood is stable, no new changes  All other systems were reviewed with the patient and are negative.  PHYSICAL EXAMINATION: ECOG PERFORMANCE STATUS: 0 - Asymptomatic  Vitals:   04/13/20 0951  BP: 138/84  Pulse: 76  Resp: 18  Temp: 98 F (36.7 C)  SpO2: 100%   Filed Weights   04/13/20 0951  Weight: 145 lb 3.2 oz (65.9 kg)    GENERAL:alert, no distress and comfortable NEURO: alert &  oriented x 3 with fluent speech, no focal motor/sensory deficits  LABORATORY DATA:  I have reviewed the data as listed     Component Value Date/Time   NA 140 10/18/2019 1049   K 3.8 10/18/2019 1049   CL 105 10/18/2019 1049   CO2 30 10/18/2019 1049   GLUCOSE 86 10/18/2019 1049   BUN 19 10/18/2019 1049   CREATININE 0.94 10/18/2019 1049   CALCIUM 9.6 10/18/2019 1049   PROT 6.8 10/18/2019 1049   ALBUMIN 4.6 08/20/2019 1227   AST 20 10/18/2019 1049   ALT 6 10/18/2019 1049   ALKPHOS 46 08/20/2019 1227   BILITOT 0.6 10/18/2019 1049   GFRNONAA >60 10/11/2019 0931   GFRAA >60 10/11/2019 0931    No results found for: SPEP, UPEP  Lab Results  Component Value Date   WBC 6.0 04/13/2020   NEUTROABS 2.6 04/13/2020   HGB 10.9 (L) 04/13/2020   HCT 34.5 (L) 04/13/2020   MCV 91.5 04/13/2020   PLT 304 04/13/2020      Chemistry      Component Value Date/Time   NA 140 10/18/2019 1049   K 3.8 10/18/2019 1049   CL 105 10/18/2019 1049   CO2 30 10/18/2019 1049   BUN 19 10/18/2019 1049   CREATININE 0.94 10/18/2019 1049      Component Value Date/Time   CALCIUM 9.6 10/18/2019 1049  ALKPHOS 46 08/20/2019 1227   AST 20 10/18/2019 1049   ALT 6 10/18/2019 1049   BILITOT 0.6 10/18/2019 1049

## 2020-04-13 NOTE — Assessment & Plan Note (Signed)
After significant evaluation, her overall cause of anemia is multifactorial, likely anemia of chronic illness related to her diabetes as well as her sarcoidosis She does not require treatment At hemoglobin greater than 10, typically, it should not affect her energy level or cause symptoms The patient is very active, in fact, after her appointments, she will be going for her Zumba class which she enjoys I plan to see her once a year for further follow-up If her hemoglobin drops to less than 10, we can consider further work-up and treatment for anemia She is in agreement with the plan of care

## 2020-04-13 NOTE — Telephone Encounter (Signed)
Scheduled appt per 2/10 sch msg -mailed letter with appt date and time

## 2020-04-19 ENCOUNTER — Ambulatory Visit: Payer: Medicare HMO | Admitting: Family Medicine

## 2020-04-21 DIAGNOSIS — H10812 Pingueculitis, left eye: Secondary | ICD-10-CM | POA: Diagnosis not present

## 2020-04-21 DIAGNOSIS — H01114 Allergic dermatitis of left upper eyelid: Secondary | ICD-10-CM | POA: Diagnosis not present

## 2020-04-21 DIAGNOSIS — H04123 Dry eye syndrome of bilateral lacrimal glands: Secondary | ICD-10-CM | POA: Diagnosis not present

## 2020-04-21 DIAGNOSIS — H01111 Allergic dermatitis of right upper eyelid: Secondary | ICD-10-CM | POA: Diagnosis not present

## 2020-04-28 ENCOUNTER — Ambulatory Visit: Payer: Medicare HMO | Admitting: Family Medicine

## 2020-05-10 ENCOUNTER — Telehealth: Payer: Self-pay | Admitting: Family Medicine

## 2020-05-10 NOTE — Telephone Encounter (Signed)
Left message for patient to call back and schedule Medicare Annual Wellness Visit (AWV) either virtually or in office.   Last AWVI  04/16/19  please schedule at anytime with [facility] Nurse Health Advisor 1 or 2   This should be a 45 minute visit.

## 2020-05-10 NOTE — Telephone Encounter (Signed)
Patient would like to opt out of AWV.

## 2020-05-18 ENCOUNTER — Ambulatory Visit (INDEPENDENT_AMBULATORY_CARE_PROVIDER_SITE_OTHER): Payer: Medicare HMO | Admitting: Family Medicine

## 2020-05-18 ENCOUNTER — Other Ambulatory Visit: Payer: Self-pay

## 2020-05-18 ENCOUNTER — Encounter: Payer: Self-pay | Admitting: Family Medicine

## 2020-05-18 VITALS — BP 124/70 | Ht 63.0 in | Wt 142.4 lb

## 2020-05-18 DIAGNOSIS — M1000 Idiopathic gout, unspecified site: Secondary | ICD-10-CM

## 2020-05-18 DIAGNOSIS — E785 Hyperlipidemia, unspecified: Secondary | ICD-10-CM

## 2020-05-18 DIAGNOSIS — Z23 Encounter for immunization: Secondary | ICD-10-CM | POA: Diagnosis not present

## 2020-05-18 DIAGNOSIS — E1169 Type 2 diabetes mellitus with other specified complication: Secondary | ICD-10-CM | POA: Diagnosis not present

## 2020-05-18 DIAGNOSIS — E1159 Type 2 diabetes mellitus with other circulatory complications: Secondary | ICD-10-CM

## 2020-05-18 DIAGNOSIS — D638 Anemia in other chronic diseases classified elsewhere: Secondary | ICD-10-CM

## 2020-05-18 DIAGNOSIS — E559 Vitamin D deficiency, unspecified: Secondary | ICD-10-CM

## 2020-05-18 DIAGNOSIS — I152 Hypertension secondary to endocrine disorders: Secondary | ICD-10-CM | POA: Diagnosis not present

## 2020-05-18 LAB — LIPID PANEL
Cholesterol: 300 mg/dL — ABNORMAL HIGH (ref 0–200)
HDL: 160 mg/dL (ref >39.00)
LDL Cholesterol: 131 mg/dL — ABNORMAL HIGH (ref 0–99)
NonHDL: 140.43
Total CHOL/HDL Ratio: 2
Triglycerides: 49 mg/dL (ref 0.0–149.0)
VLDL: 9.8 mg/dL (ref 0.0–40.0)

## 2020-05-18 LAB — COMPREHENSIVE METABOLIC PANEL
ALT: 5 U/L (ref 0–35)
AST: 18 U/L (ref 0–37)
Albumin: 4.3 g/dL (ref 3.5–5.2)
Alkaline Phosphatase: 51 U/L (ref 39–117)
BUN: 25 mg/dL — ABNORMAL HIGH (ref 6–23)
CO2: 31 mEq/L (ref 19–32)
Calcium: 9.9 mg/dL (ref 8.4–10.5)
Chloride: 99 mEq/L (ref 96–112)
Creatinine, Ser: 0.92 mg/dL (ref 0.40–1.20)
GFR: 63.61 mL/min (ref >60.00)
Glucose, Bld: 72 mg/dL (ref 70–99)
Potassium: 3.2 mEq/L — ABNORMAL LOW (ref 3.5–5.1)
Sodium: 139 mEq/L (ref 135–145)
Total Bilirubin: 0.6 mg/dL (ref 0.2–1.2)
Total Protein: 7.2 g/dL (ref 6.0–8.3)

## 2020-05-18 LAB — CBC WITH DIFFERENTIAL/PLATELET
Basophils Absolute: 0 10*3/uL (ref 0.0–0.1)
Basophils Relative: 0.9 % (ref 0.0–3.0)
Eosinophils Absolute: 0.1 10*3/uL (ref 0.0–0.7)
Eosinophils Relative: 2.2 % (ref 0.0–5.0)
HCT: 34.8 % — ABNORMAL LOW (ref 36.0–46.0)
Hemoglobin: 11.6 g/dL — ABNORMAL LOW (ref 12.0–15.0)
Lymphocytes Relative: 39.5 % (ref 12.0–46.0)
Lymphs Abs: 2.2 10*3/uL (ref 0.7–4.0)
MCHC: 33.3 g/dL (ref 30.0–36.0)
MCV: 88.6 fl (ref 78.0–100.0)
Monocytes Absolute: 0.6 10*3/uL (ref 0.1–1.0)
Monocytes Relative: 11.1 % (ref 3.0–12.0)
Neutro Abs: 2.5 10*3/uL (ref 1.4–7.7)
Neutrophils Relative %: 46.3 % (ref 43.0–77.0)
Platelets: 273 10*3/uL (ref 150.0–400.0)
RBC: 3.92 Mil/uL (ref 3.87–5.11)
RDW: 13.3 % (ref 11.5–15.5)
WBC: 5.5 10*3/uL (ref 4.0–10.5)

## 2020-05-18 LAB — MICROALBUMIN / CREATININE URINE RATIO
Creatinine,U: 176.7 mg/dL
Microalb Creat Ratio: 0.4 mg/g (ref 0.0–30.0)
Microalb, Ur: 0.7 mg/dL (ref 0.0–1.9)

## 2020-05-18 LAB — HEMOGLOBIN A1C: Hgb A1c MFr Bld: 6.2 % (ref 4.6–6.5)

## 2020-05-18 LAB — VITAMIN D 25 HYDROXY (VIT D DEFICIENCY, FRACTURES): VITD: 23.8 ng/mL — ABNORMAL LOW (ref 30.00–100.00)

## 2020-05-18 NOTE — Progress Notes (Signed)
Patient: Kristie King MRN: [id number] DOB: Mar 11, 1950 PCP: Orma Flaming, MD     Subjective:  Chief Complaint  Patient presents with  . Diabetes  . Hypertension  . Hyperlipidemia    HPI: The patient is a 70 y.o. female who presents today for HTN/DM. She also states that she still has a red spot in the corner of her left eye, that has not gone away. She has been seen in Opthalmology.   Hypertension Was on medication but I have held this since June and her pressures have been excellent. All to goal at home.   Diabetes Patient is here for follow up of type 2 diabetes. First diagnosedmid 1990s. Currently on the following medications metformin and trulicity. Takes medications as prescribed. Last A1C was5.9. Currently exercising and following diabetic diet. Denies any hypoglycemic events. Denies any vision changes, nausea, vomiting, abdominal pain, ulcers/paraesthesia in feet, polyuria, polydipsia or polyphagia. Denies any chest pain, shortness of breath. Only takes metformin 3 days/week. -needs pneumovax. Will give today   Hyperlipidemia She is on no medication. FH of CVA in her father and MI in her mother. Her HDL was over 130 last lipid panel.    Saw Dr. Herbert Deaner 04/2020  Review of Systems  Constitutional: Negative for chills, fatigue and fever.  HENT: Negative for dental problem, ear pain, hearing loss and trouble swallowing.   Eyes: Negative for visual disturbance.  Respiratory: Negative for cough, chest tightness and shortness of breath.   Cardiovascular: Negative for chest pain, palpitations and leg swelling.  Gastrointestinal: Negative for abdominal pain, blood in stool, diarrhea and nausea.  Endocrine: Negative for cold intolerance, polydipsia, polyphagia and polyuria.  Genitourinary: Negative for dysuria and hematuria.  Musculoskeletal: Negative for arthralgias.  Skin: Negative for rash.  Neurological: Negative for dizziness and headaches.  Psychiatric/Behavioral:  Negative for dysphoric mood and sleep disturbance. The patient is not nervous/anxious.     Allergies Patient has No Known Allergies.  Past Medical History Patient  has a past medical history of AC (acromioclavicular) arthritis (11/13/2016), Allergy, Cataract, Cervical radiculopathy at C6 (09/10/2016), CKD stage 2 due to type 2 diabetes mellitus ([place]) (04/14/2018), Diabetes mellitus without complication ([place]), History of lumbar laminectomy (2019), HTN (hypertension) (09/17/2012), Hyperlipidemia, Hyperlipidemia associated with type 2 diabetes mellitus ([place]) (09/17/2012), Hypertension associated with diabetes ([place]) (04/14/2018), Iron deficiency anemia (10/16/2016), Osteoarthritis of spine with radiculopathy, cervical region (08/03/2016), and Vitamin D deficiency (10/16/2016).  Surgical History Patient  has a past surgical history that includes Bunionectomy (Right, 02/18/2013); Bunionectomy (Left, 04/01/2013); Back surgery (03/30/2018); and CATARACTS.  Family History [name]'s family history includes Diabetes in her brother and sister; Hyperlipidemia in her sister; Hypertension in her brother and sister; Stroke in her father, maternal grandfather, and mother.  Social History Patient  reports that she quit smoking about 49 years ago. Her smoking use included cigarettes. She quit after 10.00 years of use. She has never used smokeless tobacco. She reports current alcohol use. She reports that she does not use drugs.    Objective: Vitals:   05/18/20 1309  BP: 124/70  Weight: 142 lb 6.4 oz (64.6 kg)  Height: 5\' 3"  (1.6 m)    Body mass index is 25.23 kg/m.  Physical Exam Vitals reviewed.  Constitutional:      Appearance: Normal appearance. She is well-developed and normal weight.  HENT:     Right Ear: External ear normal.     Left Ear: External ear normal.  Eyes:     Conjunctiva/sclera: Conjunctivae normal.  Pupils: Pupils are equal, round, and reactive to light.  Neck:     Thyroid: No  thyromegaly.     Vascular: No carotid bruit.  Cardiovascular:     Rate and Rhythm: Normal rate and regular rhythm.     Pulses: Normal pulses.     Heart sounds: Murmur heard.    Pulmonary:     Effort: Pulmonary effort is normal.     Breath sounds: Normal breath sounds.  Abdominal:     General: Bowel sounds are normal. There is no distension.     Palpations: Abdomen is soft.     Tenderness: There is no abdominal tenderness.  Musculoskeletal:     Cervical back: Normal range of motion and neck supple.  Lymphadenopathy:     Cervical: No cervical adenopathy.  Skin:    General: Skin is warm and dry.     Findings: No rash.  Neurological:     Mental Status: She is alert and oriented to person, place, and time.     Cranial Nerves: No cranial nerve deficit.     Coordination: Coordination normal.     Deep Tendon Reflexes: Reflexes normal.  Psychiatric:        Behavior: Behavior normal.    [place] Office Visit from 01/19/2020 in [place]  PHQ-2 Total Score 0         Assessment/plan: 1. Hypertension associated with diabetes ([place]) Extremely well controlled off medication. Will continue to monitor. F/u in 6 months.  - CBC with Differential/Platelet - Comprehensive metabolic panel - Microalbumin / creatinine urine ratio  2. Type 2 diabetes mellitus with other specified complication, without long-term current use of insulin ([facility]) -last a1c was 5.9. discussed stopping her metformin if a1c remains so tightly controlled and continuing trulicity alone. Only takes it 3x/week so not contributing much.  -pneumovax 23 today -has had her eye exam done -utd on other vaccines -very fit and active.  -foot exam next visit.  F/u in 6 months.  - Hemoglobin A1c - Microalbumin / creatinine urine ratio  3. Hyperlipidemia associated with type 2 diabetes mellitus ([place])  - Lipid panel  4. Vitamin D deficiency  - VITAMIN D 25 Hydroxy (Vit-D Deficiency,  Fractures)  5. Need for vaccination for Strep pneumoniae  - Pneumococcal polysaccharide vaccine 23-valent greater than or equal to 2yo subcutaneous/IM    This visit occurred during the SARS-CoV-2 public health emergency.  Safety protocols were in place, including screening questions prior to the visit, additional usage of staff PPE, and extensive cleaning of exam room while observing appropriate contact time as indicated for disinfecting solutions.     Return in about 6 months (around 11/18/2020) for diabetes f/u .   Orma Flaming, MD [place]   05/18/2020

## 2020-05-18 NOTE — Patient Instructions (Addendum)
-  you are doing soo good! im so proud of you! Routine labs today. If a1c stays extremely good im going to stop metformin.  -pneumovax shot today. Only once do not need again!   See you in 6 months! Dr. Rogers Blocker

## 2020-05-24 ENCOUNTER — Encounter: Payer: Self-pay | Admitting: Family Medicine

## 2020-05-24 ENCOUNTER — Other Ambulatory Visit: Payer: Self-pay | Admitting: Family Medicine

## 2020-05-24 DIAGNOSIS — E7800 Pure hypercholesterolemia, unspecified: Secondary | ICD-10-CM | POA: Insufficient documentation

## 2020-05-24 MED ORDER — VITAMIN D (ERGOCALCIFEROL) 1.25 MG (50000 UNIT) PO CAPS: ORAL_CAPSULE | ORAL | 0 refills | Status: DC

## 2020-05-30 ENCOUNTER — Telehealth: Payer: Self-pay

## 2020-05-30 NOTE — Telephone Encounter (Signed)
Pt returned Melittas call about lab results

## 2020-05-30 NOTE — Telephone Encounter (Signed)
I spoke with the pt to discuss lab results, pt recently saw result note and does not have any questions or concerns.

## 2020-06-02 ENCOUNTER — Telehealth: Payer: Self-pay

## 2020-06-02 NOTE — Telephone Encounter (Signed)
Patient was referred to a cardiologist ( Dr. Debara Pickett) and the next available appointment is not until July 27th. Patient was wondering we wanted her to wait or try referring her to another provider to be seen sooner  Please advise

## 2020-06-05 NOTE — Telephone Encounter (Signed)
I lvm for the pt to call the office back.

## 2020-06-05 NOTE — Telephone Encounter (Signed)
I would just wait to see Dr. Debara Pickett. We could start her on a low dose cholesterol medication until she sees him if she is okay with this. I just think the HDL is driving up the total cholesterol number and that is why I would like his input. I did send her lipid panel to Dr. Debara Pickett and he said likely genetic and sometimes when HDL is that high can actually be more of risk. So, I would say we could start low dose crestor and would just keep appointment with him. He is worth waiting for on cholesterol.   Thanks! D.r Rogers Blocker

## 2020-06-06 NOTE — Telephone Encounter (Signed)
I spoke with the pt to give message below. Pt voices understanding, and agrees with the plan to pick up the low dose Crestor. Please send to [facility] [address].

## 2020-06-07 MED ORDER — ROSUVASTATIN CALCIUM 10 MG PO TABS: 10.0000 mg | ORAL_TABLET | Freq: Every day | ORAL | 3 refills | Status: DC

## 2020-06-07 NOTE — Addendum Note (Signed)
Addended by: Orma Flaming on: 06/07/2020 02:50 PM   Modules accepted: Orders

## 2020-06-22 ENCOUNTER — Ambulatory Visit (INDEPENDENT_AMBULATORY_CARE_PROVIDER_SITE_OTHER): Payer: Medicare HMO | Admitting: Family Medicine

## 2020-06-22 ENCOUNTER — Other Ambulatory Visit: Payer: Self-pay

## 2020-06-22 ENCOUNTER — Ambulatory Visit (INDEPENDENT_AMBULATORY_CARE_PROVIDER_SITE_OTHER): Payer: Medicare HMO

## 2020-06-22 ENCOUNTER — Encounter: Payer: Self-pay | Admitting: Family Medicine

## 2020-06-22 VITALS — BP 123/77 | HR 76 | Temp 98.2°F | Ht 63.0 in | Wt 149.2 lb

## 2020-06-22 DIAGNOSIS — M25552 Pain in left hip: Secondary | ICD-10-CM

## 2020-06-22 DIAGNOSIS — R0781 Pleurodynia: Secondary | ICD-10-CM | POA: Diagnosis not present

## 2020-06-22 NOTE — Patient Instructions (Signed)
I wonder if you have a thing called painful rib syndrome. Take your advil or aleve sparingly and continue voltaren gel ice/heat. Typically this is treated conservatively. I wonder if you irritated this with twisting motion.   I put in referral to sports med as they will have more to offer than me and especially if this is not getting better.    Good to see you!  Dr. Rogers Blocker

## 2020-06-22 NOTE — Progress Notes (Signed)
Patient: Kristie King MRN: [id number] DOB: [birth year] PCP: Orma Flaming, MD     Subjective:  Chief Complaint  Patient presents with  . Hip Pain    HPI: The patient is a 70 y.o. female who presents today for left hip pain/left lateral rib pain. Starting 3 weeks ago. She has alternated between Tizanidine and Flexeril, but still has not subsided.   She states the pain started intermittently. She would sit down and when she got up it would be really sore. She states she started to do some exercises and this did not help. The Tizanidine has helped her some. The pain is started to progress. Pain is rated as a 6/10. She is able to function and do her activities. Pain does not radiate. She denies any trauma, no new workouts. She does have OA in other parts of her body. She has not taken any anti inflammatories. No burning/tingling or weakness in her leg. No stomach pain.   Review of Systems  Constitutional: Negative for chills, fatigue and fever.  HENT: Negative for dental problem, ear pain, hearing loss and trouble swallowing.   Eyes: Negative for visual disturbance.  Respiratory: Negative for cough, chest tightness and shortness of breath.   Cardiovascular: Negative for chest pain, palpitations and leg swelling.  Gastrointestinal: Negative for abdominal pain, blood in stool, diarrhea and nausea.  Endocrine: Negative for cold intolerance, polydipsia, polyphagia and polyuria.  Genitourinary: Negative for dysuria and hematuria.  Musculoskeletal: Positive for arthralgias. Negative for gait problem and joint swelling.  Skin: Negative for rash.  Neurological: Negative for dizziness and headaches.  Psychiatric/Behavioral: Negative for dysphoric mood and sleep disturbance. The patient is not nervous/anxious.     Allergies Patient has No Known Allergies.  Past Medical History Patient  has a past medical history of AC (acromioclavicular) arthritis (11/13/2016), Allergy, Cataract, Cervical  radiculopathy at C6 (09/10/2016), CKD stage 2 due to type 2 diabetes mellitus ([place]) (04/14/2018), Diabetes mellitus without complication ([place]), History of lumbar laminectomy (2019), HTN (hypertension) (09/17/2012), Hyperlipidemia, Hyperlipidemia associated with type 2 diabetes mellitus ([place]) (09/17/2012), Hypertension associated with diabetes ([place]) (04/14/2018), Iron deficiency anemia (10/16/2016), Osteoarthritis of spine with radiculopathy, cervical region (08/03/2016), and Vitamin D deficiency (10/16/2016).  Surgical History Patient  has a past surgical history that includes Bunionectomy (Right, 02/18/2013); Bunionectomy (Left, 04/01/2013); Back surgery (03/30/2018); and CATARACTS.  Family History [name]'s family history includes Diabetes in her brother and sister; Hyperlipidemia in her sister; Hypertension in her brother and sister; Stroke in her father, maternal grandfather, and mother.  Social History Patient  reports that she quit smoking about 49 years ago. Her smoking use included cigarettes. She quit after 10.00 years of use. She has never used smokeless tobacco. She reports current alcohol use. She reports that she does not use drugs.    Objective: Vitals:   06/22/20 1306  BP: 123/77  Pulse: 76  Temp: 98.2 F (36.8 C)  TempSrc: Temporal  SpO2: 100%  Weight: 149 lb 3.2 oz (67.7 kg)  Height: 5\' 3"  (1.6 m)    Body mass index is 26.43 kg/m.  Physical Exam Vitals reviewed.  Constitutional:      Appearance: Normal appearance. She is normal weight.  HENT:     Head: Normocephalic and atraumatic.  Pulmonary:     Effort: Pulmonary effort is normal.  Musculoskeletal:     Comments: TTP mainly over left lateral lower ribs with +hook sign.  Mild ttp over superior anterior iliac crest. No pain over greater trochanteric process and no  pain with resisted left hip adduction. Gait normal. No pain in gluteus.   Neurological:     Mental Status: She is alert.    Left hip xray: no acute finding,  official read pending.  Left lateral ribs: no acute fx, official read pending.     Assessment/plan: 1. Hip pain, acute, left Pain more impressive over lateral lower left ribs. Hip appears to be wnl on exam. No bursitis or ttp.  - DG Hip Unilat W OR W/O Pelvis 2-3 Views Left; Future  2. Rib pain on left side ?painful rib syndrome. She does does do a lot of twisting in her work outs. Would suspect this to be better, but has not taken anytime off. Start oral NSAID judiciously, continue voltaren and ice/heat. Also referring to sports medicine for further eval.  - DG Ribs Unilateral Left; Future      Return if symptoms worsen or fail to improve.    Orma Flaming, MD [place]   06/22/2020

## 2020-06-23 ENCOUNTER — Ambulatory Visit (INDEPENDENT_AMBULATORY_CARE_PROVIDER_SITE_OTHER): Payer: Medicare HMO

## 2020-06-23 DIAGNOSIS — R0781 Pleurodynia: Secondary | ICD-10-CM | POA: Diagnosis not present

## 2020-06-23 DIAGNOSIS — R079 Chest pain, unspecified: Secondary | ICD-10-CM | POA: Diagnosis not present

## 2020-06-23 NOTE — Addendum Note (Signed)
Addended by: Brandy Hale on: 06/23/2020 11:25 AM   Modules accepted: Orders

## 2020-06-23 NOTE — Addendum Note (Signed)
Addended by: Brandy Hale on: 06/23/2020 10:36 AM   Modules accepted: Orders

## 2020-06-27 ENCOUNTER — Other Ambulatory Visit: Payer: Self-pay

## 2020-06-27 ENCOUNTER — Ambulatory Visit: Payer: Medicare HMO | Admitting: Family Medicine

## 2020-06-27 VITALS — BP 124/82 | HR 73 | Ht 63.0 in | Wt 148.3 lb

## 2020-06-27 DIAGNOSIS — M545 Low back pain, unspecified: Secondary | ICD-10-CM | POA: Diagnosis not present

## 2020-06-27 DIAGNOSIS — R109 Unspecified abdominal pain: Secondary | ICD-10-CM | POA: Diagnosis not present

## 2020-06-27 NOTE — Progress Notes (Signed)
I, Peterson Lombard, LAT, ATC acting as a scribe for Lynne Leader, MD.  Subjective:    I'm seeing this patient as a consultation for:  Dr. Orma Flaming. Note will be routed back to referring provider/PCP.  CC: Left-sided rib and left pelvis pain  HPI: Pt is a 70 y/o female c/o L-sided anterior pelvis pain x about 4 weeks. Pt reports pain started intermittently. Pt locates pain to anterior superior iliac crest and L lateral lower ribs. No known mechanism. Pt does zumba for exercise.  Pt also c/o L-sided neck pain that started yesterday after doing a zumba class.   Radiates: no LE Numbness/tingling: no LE Weakness: no Aggravates: transitioning from seated to stand esp at the end of the day, Treatments tried: Tizanidine and Flexeril, naproxen, heat, voltaren gel.  Dx imaging: 06/23/20 L hip and L rib XR  04/16/19 L-spine XR  Past medical history, Surgical history, Family history, Social history, Allergies, and medications have been entered into the medical record, reviewed.   Review of Systems: No new headache, visual changes, nausea, vomiting, diarrhea, constipation, dizziness, abdominal pain, skin rash, fevers, chills, night sweats, weight loss, swollen lymph nodes, body aches, joint swelling, muscle aches, chest pain, shortness of breath, mood changes, visual or auditory hallucinations.   Objective:    Vitals:   06/27/20 1423  BP: 124/82  Pulse: 73  SpO2: 97%   General: Well Developed, well nourished, and in no acute distress.  Neuro/Psych: Alert and oriented x3, extra-ocular muscles intact, able to move all 4 extremities, sensation grossly intact. Skin: Warm and dry, no rashes noted.  Respiratory: Not using accessory muscles, speaking in full sentences, trachea midline.  Cardiovascular: Pulses palpable, no extremity edema. Abdomen: Does not appear distended. MSK: L-spine normal.  Nontender midline.  Normal lumbar motion.  Lower extremity strength is intact. Left flank  nontender.  Lab and Radiology Results DG Ribs Unilateral W/Chest Left  Result Date: 06/25/2020 CLINICAL DATA:  70 year old female with chest pain. EXAM: LEFT RIBS AND CHEST - 3+ VIEW COMPARISON:  None. FINDINGS: Faint bilateral lower lung field peripheral interstitial coarsening may be chronic. Atypical infection is not excluded clinical correlation is recommended. No focal consolidation, pleural effusion, or pneumothorax. The cardiac silhouette is within limits. No acute osseous pathology. IMPRESSION: No focal consolidation. Electronically Signed   By: Anner Crete M.D.   On: 06/25/2020 12:44   DG Hip Unilat W OR W/O Pelvis 2-3 Views Left  Result Date: 06/23/2020 CLINICAL DATA:  Left hip pain. EXAM: DG HIP (WITH OR WITHOUT PELVIS) 2-3V LEFT COMPARISON:  CT abdomen pelvis dated February 17, 2018. FINDINGS: No acute fracture or dislocation. Mild right hip joint space narrowing superiorly with small marginal osteophytes. The left hip joint space is relatively preserved. Bone mineralization is normal. Calcified fibroids in the pelvis. IMPRESSION: 1. No acute abnormality or significant degenerative changes of the left hip. 2. Mild right hip osteoarthritis. Electronically Signed   By: Titus Dubin M.D.   On: 06/23/2020 10:00   I, Lynne Leader, personally (independently) visualized and performed the interpretation of the images attached in this note.   Impression and Recommendations:    Assessment and Plan: 70 y.o. female with left low back to flank pain.  Thought to be muscle dysfunction and spasm.  Plan for physical therapy.  If not improving could consider further advanced imaging.  Neck step would probably be thoracic MRI for potential T10-T11 or 12 radiculopathy this is much less likely.  Recheck in 6 weeks.  PDMP not reviewed this encounter. Orders Placed This Encounter  Procedures  . Ambulatory referral to Physical Therapy    Referral Priority:   Routine    Referral Type:   Physical  Medicine    Referral Reason:   Specialty Services Required    Requested Specialty:   Physical Therapy   No orders of the defined types were placed in this encounter.   Discussed warning signs or symptoms. Please see discharge instructions. Patient expresses understanding.   The above documentation has been reviewed and is accurate and complete Lynne Leader, M.D.

## 2020-06-27 NOTE — Patient Instructions (Signed)
Thank you for coming in today.  I've referred you to Physical Therapy.  Let us know if you don't hear from them in one week.  Use heat and TENS unit as needed.   Recheck with me in about 6 weeks.   Let me know sooner if this is not working. We can do more.   TENS UNIT: This is helpful for muscle pain and spasm.   Search and Purchase a TENS 7000 2nd edition at  www.tenspros.com or www.[place].com It should be less than $30.     TENS unit instructions: Do not shower or bathe with the unit on . Turn the unit off before removing electrodes or batteries . If the electrodes lose stickiness add a drop of water to the electrodes after they are disconnected from the unit and place on plastic sheet. If you continued to have difficulty, call the TENS unit company to purchase more electrodes. . Do not apply lotion on the skin area prior to use. Make sure the skin is clean and dry as this will help prolong the life of the electrodes. . After use, always check skin for unusual red areas, rash or other skin difficulties. If there are any skin problems, does not apply electrodes to the same area. . Never remove the electrodes from the unit by pulling the wires. . Do not use the TENS unit or electrodes other than as directed. . Do not change electrode placement without consultating your therapist or physician. Marland Kitchen Keep 2 fingers with between each electrode. . Wear time ratio is 2:1, on to off times.    For example on for 30 minutes off for 15 minutes and then on for 30 minutes off for 15 minutes

## 2020-06-29 ENCOUNTER — Ambulatory Visit: Payer: Medicare HMO | Admitting: Physical Therapy

## 2020-06-29 ENCOUNTER — Encounter: Payer: Self-pay | Admitting: Physical Therapy

## 2020-06-29 ENCOUNTER — Other Ambulatory Visit: Payer: Self-pay

## 2020-06-29 DIAGNOSIS — M6281 Muscle weakness (generalized): Secondary | ICD-10-CM

## 2020-06-29 DIAGNOSIS — M25552 Pain in left hip: Secondary | ICD-10-CM | POA: Diagnosis not present

## 2020-06-29 DIAGNOSIS — M7918 Myalgia, other site: Secondary | ICD-10-CM | POA: Diagnosis not present

## 2020-06-29 NOTE — Patient Instructions (Signed)
Access Code: [email or web address] URL: [email or web address] Date: 06/29/2020 Prepared by: Daleen Bo  Exercises Supine Lower Trunk Rotation - 2 x daily - 7 x weekly - 1 sets - 10 reps - 3s hold Supine Bridge - 2 x daily - 7 x weekly - 2 sets - 10 reps Supine Transversus Abdominis Bracing with Leg Extension - 1 x daily - 7 x weekly - 2 sets - 10 reps - 2 hold

## 2020-06-29 NOTE — Therapy (Signed)
[facility] [address] [place], [place], [address] Phone: [phone]   Fax:  [phone]  Physical Therapy Evaluation  Patient Details  Name: Kristie King MRN: [id number] Date of Birth: 1951/02/15 Referring Provider (PT): Dr. Georgina Snell   Encounter Date: 06/29/2020   PT End of Session - 06/29/20 1519    Visit Number 1    Number of Visits 7    Date for PT Re-Evaluation 08/28/2020   Authorization Type Aetna Medicare    PT Start Time 1430    PT Stop Time 1515    PT Time Calculation (min) 45 min    Activity Tolerance Patient tolerated treatment well    Behavior During Therapy [facility] for tasks assessed/performed           Past Medical History:  Diagnosis Date  . AC (acromioclavicular) arthritis 11/13/2016  . Allergy    SEASONAL  . Cataract    BILATERAL-REMOVED  . Cervical radiculopathy at C6 09/10/2016  . CKD stage 2 due to type 2 diabetes mellitus ([place]) 04/14/2018  . Diabetes mellitus without complication ([facility])   . History of lumbar laminectomy 2019  . HTN (hypertension) 09/17/2012  . Hyperlipidemia   . Hyperlipidemia associated with type 2 diabetes mellitus ([place]) 09/17/2012  . Hypertension associated with diabetes ([place]) 04/14/2018  . Iron deficiency anemia 10/16/2016  . Osteoarthritis of spine with radiculopathy, cervical region 08/03/2016  . Vitamin D deficiency 10/16/2016    Past Surgical History:  Procedure Laterality Date  . BACK SURGERY  03/30/2018  . BUNIONECTOMY Right 02/18/2013  . BUNIONECTOMY Left 04/01/2013  . CATARACTS      There were no vitals filed for this visit.    Subjective Assessment - 06/29/20 1436    Subjective Pt states she had L sided rib and hip pain after Zumba class. It has been about 4 weeks.That is her closest inference as to why it is hurting. She also states that during the quick turn hurt her neck and her L side. She feels the pain more at night as she is sitting down and then getting up after sitting  down for a while. She describes the pain as sharp at first when getting up. Aggs: getting up/transfers Eases: massage, advil, voltaren. She denies pain with coughing, sneezing, bearing down, lifting. Pt states that the pain is superficial and just under the last rib. Pt denies cancer red flags. Pt denies diet changes affecting pain. Worst 8/10 Best 0/10 Current 0/10    Pertinent History Lumbar laminectomy 2020.  Pt is female   Patient Stated Goals Return to dance without pain and keep as active as possible   Currently in Pain? No/denies    Pain Score 0-No pain    Pain Orientation Left abdomen   Pain Descriptors / Indicators Sharp    Pain Type Chronic pain    Pain Onset More than a month ago    Multiple Pain Sites Yes    Pain Score 3    Pain Location Neck    Pain Orientation Left    Pain Descriptors / Indicators Sore;Aching    Aggravating Factors  turning, twisting    Pain Relieving Factors Voltaren gel, topical cream, advil              [facility] PT Assessment - 06/29/20 0001      Assessment   Medical Diagnosis Left Flank and LBP    Referring Provider (PT) Dr. Georgina Snell    Prior Therapy LBP  Precautions   Precautions None      Restrictions   Weight Bearing Restrictions No      Balance Screen   Has the patient fallen in the past 6 months No    Has the patient had a decrease in activity level because of a fear of falling?  No    Is the patient reluctant to leave their home because of a fear of falling?  No      Home Ecologist residence    Living Arrangements Spouse/significant other    Type of [place] --      Prior Function   Level of [place] Retired    Leisure exercise, travel, house projects, dance      Cognition   Overall Cognitive Status Within Functional Limits for tasks assessed      Observation/Other Assessments   Other Surveys  Oswestry Disability Index    Oswestry  Disability Index  18%      Posture/Postural Control   Posture/Postural Control No significant limitations      AROM   Overall AROM  --    Overall AROM Comments L/S WFL; pain with R rot on L flank, stretching with R SB      PROM   Overall PROM  Deficits    Overall PROM Comments Bilat hip moderately limited in flexion, extension, and IR      Strength   Overall Strength --    Overall Strength Comments 4/5 bilateral hips, difficulty with maintaining neutral spine with LE movement      Palpation   Spinal mobility no significant abnormality, stiffnes into extension L1-5 with CPA    Palpation comment TTP and recreation of pain along L iliac crest and last rib, lateral to medial direction, no pain with ilipsoas palpation; no bulges/protrusions noted along femoral or inguinal region      Transfers   Transfers Independent with all Transfers      Ambulation/Gait   Ambulation/Gait Yes    Gait Pattern Within Functional Limits                      Objective measurements completed on examination: See above findings.       [facility] Adult PT Treatment/Exercise - 06/29/20 0001      Exercises   Exercises Lumbar      Lumbar Exercises: Stretches   Lower Trunk Rotation Limitations 10x 3s each      Lumbar Exercises: Supine   Bridge Limitations segmental bridge 10x    Other Supine Lumbar Exercises TrA brace with LE extension and PPT 10x each                    PT Short Term Goals - 06/29/20 1709      PT SHORT TERM GOAL #1   Title Pt will become independent with HEP in order to demonstrate synthesis of PT education.    Time 2    Period Weeks    Status New      PT SHORT TERM GOAL #2   Title Pt will report at least 2 pt reduction on VAS scale for pain in order to demonstrate funcitonal improvement with ADL.    Time 3    Period Weeks    Status New             PT Long Term Goals -  06/29/20 1710      PT LONG TERM GOAL #1   Title Pt will become independent  with final HEP in order to demonstrate synthesis of PT education.    Time 6    Period Weeks    Status New      PT LONG TERM GOAL #2   Title Pt will be able to demonstrate ability to dance/spin without pain in order to demonstrate functionl improvement and tolerance to low level plyometric loading during dance related activity.    Time 6    Period Weeks    Status New      PT LONG TERM GOAL #3   Title Pt will demonstrate at least a 12.8 improvement in Oswestry Index in order to demonstrate a clinically significant change in LBP and function.    Time 6    Period Weeks    Status New                                   Plan - 06/29/20 1519    Clinical Impression Statement Pt is a 70 y.o. female presenting to PT eval today for CC of L sided flank/rib pain. Pt presents with limited L/S ROM, muscle spasm of the obliques, and generalized lumbopelvic weakness. Pt's s/s are consistent with an abdominal strain following high velocity rotational movements performed during Zumba and dance classes. Clinical testing and pt report does not suggest hernia rib fracture. Pt's impairments limit her ability to fully participate in daily activity and physical exercise without pain and discomfort. Pt would benefit from continued skilled therapy in ordre to progress functional lumbopelvic strength and core stability for full return to PLOF.    Personal Factors and Comorbidities Comorbidity 1;Age;Time since onset of injury/illness/exacerbation    Comorbidities previous LBP    Examination-Activity Limitations Carry;Sit;Transfers;Sleep    Examination-Participation Restrictions Driving;Yard Work;Community Activity    Stability/Clinical Decision Making Stable/Uncomplicated    Clinical Decision Making Low    Rehab Potential Excellent    PT Frequency 1x / week    PT Duration 6 weeks    PT Treatment/Interventions ADLs/Self Care Home Management;Cryotherapy;Electrical Stimulation;Moist Heat;Functional mobility  training;Neuromuscular re-education;Therapeutic exercise;Therapeutic activities;Patient/family education;Manual techniques;Passive range of motion;Taping;Dry needling;Iontophoresis 4mg /ml Dexamethasone;Ultrasound;Traction;Gait training;Stair training;Balance training    PT Next Visit Plan review HEP, add core strength, lumbar segmental mobility, soft tissue to lumbar spine    PT Home Exercise Plan --    Consulted and Agree with Plan of Care Patient           Patient will benefit from skilled therapeutic intervention in order to improve the following deficits and impairments:  Decreased activity tolerance,Impaired flexibility,Pain,Improper body mechanics,Decreased range of motion,Increased muscle spasms  Visit Diagnosis: Pain in left hip  Muscular abdominal pain in left flank  Muscle weakness (generalized)     Problem List Patient Active Problem List   Diagnosis Date Noted  . Hypercholesteremia 05/24/2020  . Anemia of chronic disease 05/18/2020  . Hx of decompressive lumbar laminectomy 04/17/2018  . Hypertension associated with diabetes ([place]) 04/14/2018  . Sarcoidosis, with skin manifestations 04/14/2018  . Allergic rhinitis 04/14/2018  . Lumbar spinal stenosis 01/21/2017  . AC (acromioclavicular) arthritis 11/13/2016  . Vitamin D deficiency 10/16/2016  . Cervical radiculopathy at C6 09/10/2016  . Osteoarthritis of spine with radiculopathy, cervical region 08/03/2016  . Rotator cuff tear 04/12/2014  . Synovitis of knee 09/01/2013  . Type 2 diabetes mellitus with other  specified complication [facility])     Daleen Bo PT, DPT 06/29/20 5:22 PM   [facility] [address] [place], [place], [address] Phone: [phone]   Fax:  [phone]  Name: Kristie King MRN: [id number] Date of Birth: 11-23-1950

## 2020-07-06 ENCOUNTER — Ambulatory Visit: Payer: Medicare HMO | Admitting: Physical Therapy

## 2020-07-06 ENCOUNTER — Other Ambulatory Visit: Payer: Self-pay

## 2020-07-06 ENCOUNTER — Encounter: Payer: Self-pay | Admitting: Physical Therapy

## 2020-07-06 DIAGNOSIS — M25552 Pain in left hip: Secondary | ICD-10-CM | POA: Diagnosis not present

## 2020-07-06 DIAGNOSIS — M7918 Myalgia, other site: Secondary | ICD-10-CM | POA: Diagnosis not present

## 2020-07-06 DIAGNOSIS — M6281 Muscle weakness (generalized): Secondary | ICD-10-CM | POA: Diagnosis not present

## 2020-07-06 NOTE — Patient Instructions (Signed)
Access Code: [email or web address] URL: [email or web address] Date: 07/06/2020 Prepared by: Daleen Bo  Exercises Supine Lower Trunk Rotation - 1 x daily - 7 x weekly - 1 sets - 10 reps - 3s hold Supine Bridge - 1 x daily - 7 x weekly - 2 sets - 10 reps Supine Transversus Abdominis Bracing with Leg Extension - 1 x daily - 7 x weekly - 2 sets - 10 reps - 2 hold Standing Anti-Rotation Press with Anchored Resistance - 1 x daily - 7 x weekly - 2 sets - 10 reps Half Kneeling Hip Flexor Stretch with Sidebend - 2 x daily - 7 x weekly - 1 sets - 3 reps - 30 hold

## 2020-07-06 NOTE — Therapy (Signed)
[facility] [address] [place], [place], [address] Phone: [phone]   Fax:  [phone]  Physical Therapy Treatment  Patient Details  Name: Kristie King MRN: [id number] Date of Birth: 06-06-1950 Referring Provider (PT): Dr. Georgina Snell   Encounter Date: 07/06/2020   PT End of Session - 07/06/20 1448    Visit Number 2    Number of Visits 7    Date for PT Re-Evaluation 08/28/20    Authorization Type Aetna Medicare    PT Start Time 0350    PT Stop Time 1513    PT Time Calculation (min) 40 min    Activity Tolerance Patient tolerated treatment well    Behavior During Therapy [facility] for tasks assessed/performed           Past Medical History:  Diagnosis Date  . AC (acromioclavicular) arthritis 11/13/2016  . Allergy    SEASONAL  . Cataract    BILATERAL-REMOVED  . Cervical radiculopathy at C6 09/10/2016  . CKD stage 2 due to type 2 diabetes mellitus ([place]) 04/14/2018  . Diabetes mellitus without complication ([place])   . History of lumbar laminectomy 2019  . HTN (hypertension) 09/17/2012  . Hyperlipidemia   . Hyperlipidemia associated with type 2 diabetes mellitus ([place]) 09/17/2012  . Hypertension associated with diabetes ([place]) 04/14/2018  . Iron deficiency anemia 10/16/2016  . Osteoarthritis of spine with radiculopathy, cervical region 08/03/2016  . Vitamin D deficiency 10/16/2016    Past Surgical History:  Procedure Laterality Date  . BACK SURGERY  03/30/2018  . BUNIONECTOMY Right 02/18/2013  . BUNIONECTOMY Left 04/01/2013  . CATARACTS      There were no vitals filed for this visit.   Subjective Assessment - 07/06/20 1434    Subjective Pt stated that the exercises helped but she may have "over extended" herself the gym. She states she is still doing line dancing and Zumba classes at the gym. She tried to do "lower impact" with the Zumba. Worst was only 4/10.    Pertinent History Lumbar laminectomy 2020.  Pt is FEMALE    Patient Stated  Goals reduce lumbar pain    Currently in Pain? No/denies    Pain Score 0-No pain    Pain Onset More than a month ago                             [facility] Adult PT Treatment/Exercise - 07/06/20 0001      Transfers   Transfers Independent with all Transfers      Ambulation/Gait   Ambulation/Gait Yes    Gait Pattern Within Functional Limits      Posture/Postural Control   Posture/Postural Control No significant limitations      Exercises   Exercises Lumbar      Lumbar Exercises: Stretches   Lower Trunk Rotation Limitations 10x 3s each    Other Lumbar Stretch Exercise sidelying QL stretch 30s 3x    Other Lumbar Stretch Exercise kneeling hip flexor stretch 30s 3x      Lumbar Exercises: Standing   Other Standing Lumbar Exercises Paloff Press 2x10 each; green TB    Other Standing Lumbar Exercises swissball ab roll out 10x      Lumbar Exercises: Supine   Bridge Limitations segmental bridge 10x    Other Supine Lumbar Exercises TrA brace with LE extension and PPT 10x each      Manual Therapy   Manual Therapy Soft tissue mobilization  Soft tissue mobilization L oblique around last rib and iliac crest                  PT Education - 07/06/20 1615    Education Details MOI, diagnosis, prognosis, anatomy, exercise progression, DOMS expectations, muscle firing, HEP, POC    Person(s) Educated Patient    Methods Explanation;Demonstration;Tactile cues;Verbal cues;Handout    Comprehension Verbalized understanding;Returned demonstration;Verbal cues required;Tactile cues required            PT Short Term Goals - 06/29/20 1709      PT SHORT TERM GOAL #1   Title Pt will become independent with HEP in order to demonstrate synthesis of PT education.    Time 2    Period Weeks    Status New      PT SHORT TERM GOAL #2   Title Pt will report at least 2 pt reduction on VAS scale for pain in order to demonstrate funcitonal improvement with ADL.    Time 3     Period Weeks    Status New             PT Long Term Goals - 06/29/20 1710      PT LONG TERM GOAL #1   Title Pt will become independent with final HEP in order to demonstrate synthesis of PT education.    Time 6    Period Weeks    Status New      PT LONG TERM GOAL #2   Title Pt will be able to demonstrate ability to dance/spin without pain in order to demonstrate functionl improvement and tolerance to low level plyometric loading during dance related activity.    Time 6    Period Weeks    Status New      PT LONG TERM GOAL #3   Title Pt will demonstrate at least a 12.8 improvement in Oswestry Index in order to demonstrate a clinically significant change in LBP and function.    Time 6    Period Weeks    Status New      PT LONG TERM GOAL #4   Time --    Period --    Status --                 Plan - 07/06/20 1514    Clinical Impression Statement Pt presents with decreased pain and improved trunk rotation as compared to previous session. Pt did have TTP along L iliac crest and last rib and hypertonicity of obliques/abdominals that was relieved with gentle STM. Pt was able to progress stretching movements as well as core stability exercise without increase in pain. Pt was able to demonstrate good synthesis of HEP and pt edu re trunk rotation movements. Plan to progress lumbopelvic strengthening as tolerated. Pt would benefit from continued skilled therapy in ordre to progress functional lumbopelvic strength and core stability for full return to PLOF.    Personal Factors and Comorbidities Comorbidity 1;Age;Time since onset of injury/illness/exacerbation    Comorbidities previous LBP    Examination-Activity Limitations Carry;Sit;Transfers;Sleep    Examination-Participation Restrictions Driving;Yard Work;Community Activity    Stability/Clinical Decision Making Stable/Uncomplicated    Rehab Potential Excellent    PT Frequency 1x / week    PT Duration 6 weeks    PT  Treatment/Interventions ADLs/Self Care Home Management;Cryotherapy;Electrical Stimulation;Moist Heat;Functional mobility training;Neuromuscular re-education;Therapeutic exercise;Therapeutic activities;Patient/family education;Manual techniques;Passive range of motion;Taping;Dry needling;Iontophoresis 4mg /ml Dexamethasone;Ultrasound;Traction;Gait training;Stair training;Balance training    PT Next Visit Plan review HEP, add core  strength, lumbar segmental mobility, open book stretch, bridge with march, resisted SB    Consulted and Agree with Plan of Care Patient           Patient will benefit from skilled therapeutic intervention in order to improve the following deficits and impairments:  Decreased activity tolerance,Impaired flexibility,Pain,Improper body mechanics,Decreased range of motion,Increased muscle spasms  Visit Diagnosis: Pain in left hip  Muscular abdominal pain in left flank  Muscle weakness (generalized)     Problem List Patient Active Problem List   Diagnosis Date Noted  . Hypercholesteremia 05/24/2020  . Anemia of chronic disease 05/18/2020  . Hx of decompressive lumbar laminectomy 04/17/2018  . Hypertension associated with diabetes ([place]) 04/14/2018  . Sarcoidosis, with skin manifestations 04/14/2018  . Allergic rhinitis 04/14/2018  . Lumbar spinal stenosis 01/21/2017  . AC (acromioclavicular) arthritis 11/13/2016  . Vitamin D deficiency 10/16/2016  . Cervical radiculopathy at C6 09/10/2016  . Osteoarthritis of spine with radiculopathy, cervical region 08/03/2016  . Rotator cuff tear 04/12/2014  . Synovitis of knee 09/01/2013  . Type 2 diabetes mellitus with other specified complication [facility])     Daleen Bo PT, DPT 07/06/20 4:40 PM   [facility] [address] [place], [place], [address] Phone: [phone]   Fax:  [phone]  Name: ATLANTIS DELONG MRN: [id number] Date of Birth: 01-16-1951

## 2020-07-13 ENCOUNTER — Ambulatory Visit: Payer: Medicare HMO | Admitting: Physical Therapy

## 2020-07-13 ENCOUNTER — Other Ambulatory Visit: Payer: Self-pay

## 2020-07-13 ENCOUNTER — Other Ambulatory Visit: Payer: Self-pay | Admitting: Family Medicine

## 2020-07-13 ENCOUNTER — Encounter: Payer: Self-pay | Admitting: Physical Therapy

## 2020-07-13 DIAGNOSIS — M6281 Muscle weakness (generalized): Secondary | ICD-10-CM | POA: Diagnosis not present

## 2020-07-13 DIAGNOSIS — M25552 Pain in left hip: Secondary | ICD-10-CM | POA: Diagnosis not present

## 2020-07-13 DIAGNOSIS — M7918 Myalgia, other site: Secondary | ICD-10-CM

## 2020-07-13 NOTE — Therapy (Signed)
[facility] [address] [place], [place], [address] Phone: [phone]   Fax:  [phone]  Physical Therapy Treatment  Patient Details  Name: Kristie King MRN: [id number] Date of Birth: 04/28/1950 Referring Provider (PT): Dr. Georgina Snell   Encounter Date: 07/13/2020   PT End of Session - 07/13/20 1730    Visit Number 3   pt arrived late. unable to accomodate   Number of Visits 7    Date for PT Re-Evaluation 08/28/20    Authorization Type [employer]    PT Start Time 1525    PT Stop Time 1600    PT Time Calculation (min) 35 min    Activity Tolerance Patient tolerated treatment well    Behavior During Therapy WFL for tasks assessed/performed           Past Medical History:  Diagnosis Date  . AC (acromioclavicular) arthritis 11/13/2016  . Allergy    SEASONAL  . Cataract    BILATERAL-REMOVED  . Cervical radiculopathy at C6 09/10/2016  . CKD stage 2 due to type 2 diabetes mellitus ([place]) 04/14/2018  . Diabetes mellitus without complication ([place])   . History of lumbar laminectomy 2019  . HTN (hypertension) 09/17/2012  . Hyperlipidemia   . Hyperlipidemia associated with type 2 diabetes mellitus ([place]) 09/17/2012  . Hypertension associated with diabetes ([place]) 04/14/2018  . Iron deficiency anemia 10/16/2016  . Osteoarthritis of spine with radiculopathy, cervical region 08/03/2016  . Vitamin D deficiency 10/16/2016    Past Surgical History:  Procedure Laterality Date  . BACK SURGERY  03/30/2018  . BUNIONECTOMY Right 02/18/2013  . BUNIONECTOMY Left 04/01/2013  . CATARACTS      There were no vitals filed for this visit.   Subjective Assessment - 07/13/20 1534    Subjective Pt states the pain has been up and down. This past week she has been backing down on the Zumba classes bc she was worried she may have been overloading it. She still feels the pain at the top of the L hip. She reports gardening/working out in the yard instead of going  to the gym. She is unsure if the activity affects the pain.   Pertinent History Lumbar laminectomy 2020.  Pt is FEMALE    Patient Stated Goals reduce lumbar pain    Currently in Pain? No/denies    Pain Score 0-No pain    Pain Onset More than a month ago                             [facility] Adult PT Treatment/Exercise - 07/13/20 0001      Transfers   Transfers --      Ambulation/Gait   Ambulation/Gait Yes    Gait Pattern Within Functional Limits      Posture/Postural Control   Posture/Postural Control No significant limitations      Exercises   Exercises Lumbar      Lumbar Exercises: Stretches   Lower Trunk Rotation Limitations 10x 3s each    Other Lumbar Stretch Exercise sidelying QL stretch 30s 3x    Other Lumbar Stretch Exercise kneeling hip flexor stretch 30s 3x, thomas EOB 30s 1x (DC no feeling of stretch)      Lumbar Exercises: Standing   Other Standing Lumbar Exercises Paloff Press 2x10 each; green TB    Other Standing Lumbar Exercises --      Lumbar Exercises: Supine   Bridge Limitations segmental bridge 10x  Other Supine Lumbar Exercises deadbug 10x      Lumbar Exercises: Sidelying   Other Sidelying Lumbar Exercises T/S open book stretch 10x 5s      Lumbar Exercises: Quadruped   Other Quadruped Lumbar Exercises childs pose to L 10s 5x hold                  PT Education - 07/13/20 1729    Education Details anatomy, exercise progression, joint protection, rest/recovery/nutrition, load management, muscle firing, HEP, POC    Person(s) Educated Patient    Methods Explanation;Demonstration;Tactile cues;Verbal cues;Handout    Comprehension Verbalized understanding;Returned demonstration;Verbal cues required;Tactile cues required            PT Short Term Goals - 06/29/20 1709      PT SHORT TERM GOAL #1   Title Pt will become independent with HEP in order to demonstrate synthesis of PT education.    Time 2    Period Weeks    Status  New      PT SHORT TERM GOAL #2   Title Pt will report at least 2 pt reduction on VAS scale for pain in order to demonstrate funcitonal improvement with ADL.    Time 3    Period Weeks    Status New             PT Long Term Goals - 06/29/20 1710      PT LONG TERM GOAL #1   Title Pt will become independent with final HEP in order to demonstrate synthesis of PT education.    Time 6    Period Weeks    Status New      PT LONG TERM GOAL #2   Title Pt will be able to demonstrate ability to dance/spin without pain in order to demonstrate functionl improvement and tolerance to low level plyometric loading during dance related activity.    Time 6    Period Weeks    Status New      PT LONG TERM GOAL #3   Title Pt will demonstrate at least a 12.8 improvement in Oswestry Index in order to demonstrate a clinically significant change in LBP and function.    Time 6    Period Weeks    Status New      PT LONG TERM GOAL #4   Time --    Period --    Status --                 Plan - 07/13/20 1731    Clinical Impression Statement Pt required review of HEP and cuing for proper technique and form. Pt continues to have L flank/abdominal pain that is still consistent with muscle strain presentation. Pt may be slow to recover due to continued physical activity as well as possible nutritional deficits, based on pt report of lower protein intake. Plan to progress T/S and L/S ROM exercise as able along with lumbopelvic stability exercise. Trial lat stretching and QL resisted SB at next session. Pt would benefit from continued skilled therapy in ordre to progress functional lumbopelvic strength and core stability for full return to PLOF.    Personal Factors and Comorbidities Comorbidity 1;Age;Time since onset of injury/illness/exacerbation    Comorbidities previous LBP    Examination-Activity Limitations Carry;Sit;Transfers;Sleep    Examination-Participation Restrictions Driving;Yard  Work;Community Activity    Stability/Clinical Decision Making Stable/Uncomplicated    Rehab Potential Excellent    PT Frequency 1x / week    PT Duration 6 weeks  PT Treatment/Interventions ADLs/Self Care Home Management;Cryotherapy;Electrical Stimulation;Moist Heat;Functional mobility training;Neuromuscular re-education;Therapeutic exercise;Therapeutic activities;Patient/family education;Manual techniques;Passive range of motion;Taping;Dry needling;Iontophoresis 4mg /ml Dexamethasone;Ultrasound;Traction;Gait training;Stair training;Balance training    PT Next Visit Plan review HEP, add core strength, lumbar segmental mobility, open book stretch, bridge with march, resisted SB    Consulted and Agree with Plan of Care Patient           Patient will benefit from skilled therapeutic intervention in order to improve the following deficits and impairments:  Decreased activity tolerance,Impaired flexibility,Pain,Improper body mechanics,Decreased range of motion,Increased muscle spasms  Visit Diagnosis: Pain in left hip  Muscular abdominal pain in left flank  Muscle weakness (generalized)     Problem List Patient Active Problem List   Diagnosis Date Noted  . Hypercholesteremia 05/24/2020  . Anemia of chronic disease 05/18/2020  . Hx of decompressive lumbar laminectomy 04/17/2018  . Hypertension associated with diabetes ([place]) 04/14/2018  . Sarcoidosis, with skin manifestations 04/14/2018  . Allergic rhinitis 04/14/2018  . Lumbar spinal stenosis 01/21/2017  . AC (acromioclavicular) arthritis 11/13/2016  . Vitamin D deficiency 10/16/2016  . Cervical radiculopathy at C6 09/10/2016  . Osteoarthritis of spine with radiculopathy, cervical region 08/03/2016  . Rotator cuff tear 04/12/2014  . Synovitis of knee 09/01/2013  . Type 2 diabetes mellitus with other specified complication [facility])     Daleen Bo PT, DPT 07/13/20 5:36 PM    [facility] [address] [place], [place], [address] Phone: [phone]   Fax:  [phone]  Name: TRELLIS GUIRGUIS MRN: [id number] Date of Birth: [birth year]

## 2020-07-13 NOTE — Telephone Encounter (Signed)
Patient states she was getting samples of Trulicity while in [place].    States she is unable to get samples anymore.    Is requesting script to be sent in to pharmacy.

## 2020-07-13 NOTE — Patient Instructions (Signed)
Access Code: [email or web address] URL: [email or web address] Date: 07/13/2020 Prepared by: Daleen Bo  Exercises Supine Lower Trunk Rotation - 1 x daily - 7 x weekly - 1 sets - 10 reps - 3s hold Supine Bridge - 1 x daily - 7 x weekly - 2 sets - 10 reps Standing Anti-Rotation Press with Anchored Resistance - 1 x daily - 7 x weekly - 2 sets - 10 reps Half Kneeling Hip Flexor Stretch with Sidebend - 2 x daily - 7 x weekly - 1 sets - 3 reps - 30 hold Dead Bug - 1 x daily - 7 x weekly - 2 sets - 10 reps

## 2020-07-19 ENCOUNTER — Telehealth: Payer: Self-pay

## 2020-07-19 NOTE — Telephone Encounter (Signed)
I would do an icepack and take ibuprofen as needed. If any blistering I can send in cream, but I would try conservative therapy at this time.  Orma Flaming, MD [facility]

## 2020-07-19 NOTE — Telephone Encounter (Signed)
Pt called stating she has been experiencing some stiffness in her neck so she used a heating pad the other day to help relieve pain. Pt fell asleep with heating pad on and it burned her neck. Pt states she has been putting cocoa butter on the burn but it has not helped. Pt asked if Dr. Rogers Blocker could prescribe her something or give advise on something else she could use. Please advise.

## 2020-07-20 ENCOUNTER — Other Ambulatory Visit: Payer: Self-pay

## 2020-07-20 ENCOUNTER — Ambulatory Visit: Payer: Medicare HMO | Admitting: Physical Therapy

## 2020-07-20 ENCOUNTER — Encounter: Payer: Self-pay | Admitting: Physical Therapy

## 2020-07-20 DIAGNOSIS — M6281 Muscle weakness (generalized): Secondary | ICD-10-CM | POA: Diagnosis not present

## 2020-07-20 DIAGNOSIS — M25552 Pain in left hip: Secondary | ICD-10-CM

## 2020-07-20 DIAGNOSIS — R0781 Pleurodynia: Secondary | ICD-10-CM | POA: Diagnosis not present

## 2020-07-20 DIAGNOSIS — M7918 Myalgia, other site: Secondary | ICD-10-CM | POA: Diagnosis not present

## 2020-07-20 NOTE — Telephone Encounter (Signed)
I spoke with the pt to give message below. Pt voices understanding.

## 2020-07-20 NOTE — Patient Instructions (Signed)
Access Code: [email or web address] URL: [email or web address] Date: 07/20/2020 Prepared by: Daleen Bo  Exercises Supine Lower Trunk Rotation - 1 x daily - 7 x weekly - 1 sets - 10 reps - 3s hold Supine Bridge - 1 x daily - 7 x weekly - 2 sets - 10 reps Standing Anti-Rotation Press with Anchored Resistance - 1 x daily - 7 x weekly - 2 sets - 10 reps Half Kneeling Hip Flexor Stretch with Sidebend - 2 x daily - 7 x weekly - 1 sets - 3 reps - 30 hold Dead Bug - 1 x daily - 7 x weekly - 2 sets - 10 reps Side Lying Open Book - 1 x daily - 7 x weekly - 2 sets - 10 reps - 5 hold Thoracic Extension Mobilization on Foam Roll - 1 x daily - 7 x weekly - 2 sets - 10 reps - 5 hold

## 2020-07-20 NOTE — Therapy (Signed)
[facility] [address] [place], [place], [address] Phone: [phone]   Fax:  [phone]  Physical Therapy Treatment  Patient Details  Name: Kristie King MRN: [id number] Date of Birth: [birth year] Referring Provider (PT): Dr. Georgina Snell   Encounter Date: 07/20/2020   PT End of Session - 07/20/20 1708    Visit Number 4   pt arrived late. Unable to accomodate   Number of Visits 7    Date for PT Re-Evaluation 08/28/20    Authorization Type [employer] Medicare    PT Start Time 1525    PT Stop Time 1600    PT Time Calculation (min) 35 min    Activity Tolerance Patient tolerated treatment well    Behavior During Therapy WFL for tasks assessed/performed           Past Medical History:  Diagnosis Date  . AC (acromioclavicular) arthritis 11/13/2016  . Allergy    SEASONAL  . Cataract    BILATERAL-REMOVED  . Cervical radiculopathy at C6 09/10/2016  . CKD stage 2 due to type 2 diabetes mellitus ([place]) 04/14/2018  . Diabetes mellitus without complication ([place])   . History of lumbar laminectomy 2019  . HTN (hypertension) 09/17/2012  . Hyperlipidemia   . Hyperlipidemia associated with type 2 diabetes mellitus ([place]) 09/17/2012  . Hypertension associated with diabetes ([place]) 04/14/2018  . Iron deficiency anemia 10/16/2016  . Osteoarthritis of spine with radiculopathy, cervical region 08/03/2016  . Vitamin D deficiency 10/16/2016    Past Surgical History:  Procedure Laterality Date  . BACK SURGERY  03/30/2018  . BUNIONECTOMY Right 02/18/2013  . BUNIONECTOMY Left 04/01/2013  . CATARACTS      There were no vitals filed for this visit.   Subjective Assessment - 07/20/20 1524    Subjective Pt states that the pain is still similar. She has been taking more Aleve to try and reduce the pain. She has been doing the HEP as instructed.    Pertinent History Lumbar laminectomy 2020.  Pt is FEMALE    Patient Stated Goals reduce lumbar and flank pain.     Currently in Pain? No/denies    Pain Score 0-No pain                             [facility] Adult PT Treatment/Exercise - 07/20/20 0001      Ambulation/Gait   Ambulation/Gait Yes    Gait Pattern Within Functional Limits      Posture/Postural Control   Posture/Postural Control No significant limitations      Exercises   Exercises Lumbar      Lumbar Exercises: Stretches   Lower Trunk Rotation Limitations 10x 3s each    Other Lumbar Stretch Exercise sidelying QL stretch 30s 3x      Lumbar Exercises: Supine   Other Supine Lumbar Exercises self foam roll and T/S extension 15x 5s hold      Lumbar Exercises: Sidelying   Other Sidelying Lumbar Exercises T/S open book stretch 10x 5s      Manual Therapy   Manual Therapy Joint mobilization    Joint Mobilization T4-10 CPA and UPA grade III-IVA, T5-7 CPA grade V    Soft tissue mobilization bilateral T/S                  PT Education - 07/20/20 1709    Education Details anatomy, exercise progression, joint protection, rest/recovery/nutrition, load management, muscle firing, HEP, POC  Person(s) Educated Patient    Methods Explanation;Demonstration;Tactile cues;Verbal cues;Handout    Comprehension Verbalized understanding;Returned demonstration;Verbal cues required;Tactile cues required            PT Short Term Goals - 06/29/20 1709      PT SHORT TERM GOAL #1   Title Pt will become independent with HEP in order to demonstrate synthesis of PT education.    Time 2    Period Weeks    Status New      PT SHORT TERM GOAL #2   Title Pt will report at least 2 pt reduction on VAS scale for pain in order to demonstrate funcitonal improvement with ADL.    Time 3    Period Weeks    Status New             PT Long Term Goals - 06/29/20 1710      PT LONG TERM GOAL #1   Title Pt will become independent with final HEP in order to demonstrate synthesis of PT education.    Time 6    Period Weeks    Status New       PT LONG TERM GOAL #2   Title Pt will be able to demonstrate ability to dance/spin without pain in order to demonstrate functionl improvement and tolerance to low level plyometric loading during dance related activity.    Time 6    Period Weeks    Status New      PT LONG TERM GOAL #3   Title Pt will demonstrate at least a 12.8 improvement in Oswestry Index in order to demonstrate a clinically significant change in LBP and function.    Time 6    Period Weeks    Status New      PT LONG TERM GOAL #4   Time --    Period --    Status --                 Plan - 07/20/20 1717    Clinical Impression Statement Pt presented with increased T/S and rib mobility deficits on L side that could be causign referred pain down the L flank/abdomen. Pt had increased soft tissue extensibility following STM and joint mob and appeared to have improved T/S movement. Pt was instructed in foam rolling with HEP while traveling and to include thoracic mobilization movements. Trial lat stretching at next session. Pt would benefit from continued skilled therapy in ordre to progress functional lumbopelvic strength and core stability for full return to PLOF.    Personal Factors and Comorbidities Comorbidity 1;Age;Time since onset of injury/illness/exacerbation    Comorbidities previous LBP    Examination-Activity Limitations Carry;Sit;Transfers;Sleep    Examination-Participation Restrictions Driving;Yard Work;Community Activity    Stability/Clinical Decision Making Stable/Uncomplicated    Rehab Potential Excellent    PT Frequency 1x / week    PT Duration 6 weeks    PT Treatment/Interventions ADLs/Self Care Home Management;Cryotherapy;Electrical Stimulation;Moist Heat;Functional mobility training;Neuromuscular re-education;Therapeutic exercise;Therapeutic activities;Patient/family education;Manual techniques;Passive range of motion;Taping;Dry needling;Iontophoresis 4mg /ml Dexamethasone;Ultrasound;Traction;Gait  training;Stair training;Balance training    PT Next Visit Plan review HEP, add core strength, lumbar segmental mobility, resisted SB    Consulted and Agree with Plan of Care Patient           Patient will benefit from skilled therapeutic intervention in order to improve the following deficits and impairments:  Decreased activity tolerance,Impaired flexibility,Pain,Improper body mechanics,Decreased range of motion,Increased muscle spasms  Visit Diagnosis: Muscular abdominal pain in left flank  Muscle  weakness (generalized)  Hip pain, acute, left  Rib pain on left side     Problem List Patient Active Problem List   Diagnosis Date Noted  . Hypercholesteremia 05/24/2020  . Anemia of chronic disease 05/18/2020  . Hx of decompressive lumbar laminectomy 04/17/2018  . Hypertension associated with diabetes ([place]) 04/14/2018  . Sarcoidosis, with skin manifestations 04/14/2018  . Allergic rhinitis 04/14/2018  . Lumbar spinal stenosis 01/21/2017  . AC (acromioclavicular) arthritis 11/13/2016  . Vitamin D deficiency 10/16/2016  . Cervical radiculopathy at C6 09/10/2016  . Osteoarthritis of spine with radiculopathy, cervical region 08/03/2016  . Rotator cuff tear 04/12/2014  . Synovitis of knee 09/01/2013  . Type 2 diabetes mellitus with other specified complication [facility])    Daleen Bo PT, DPT 07/20/20 8:09 PM   [facility] [address] [place], [place], [address] Phone: [phone]   Fax:  [phone]  Name: LITHZY BERNARD MRN: [id number] Date of Birth: 04/17/1950

## 2020-08-08 ENCOUNTER — Ambulatory Visit: Payer: Medicare HMO | Admitting: Family Medicine

## 2020-08-08 ENCOUNTER — Other Ambulatory Visit: Payer: Self-pay | Admitting: Family Medicine

## 2020-08-21 ENCOUNTER — Ambulatory Visit: Payer: Medicare HMO | Admitting: Family Medicine

## 2020-09-13 ENCOUNTER — Encounter (HOSPITAL_BASED_OUTPATIENT_CLINIC_OR_DEPARTMENT_OTHER): Payer: Self-pay | Admitting: Physical Therapy

## 2020-09-13 ENCOUNTER — Other Ambulatory Visit: Payer: Self-pay

## 2020-09-13 ENCOUNTER — Ambulatory Visit (HOSPITAL_BASED_OUTPATIENT_CLINIC_OR_DEPARTMENT_OTHER): Payer: Medicare HMO | Attending: Family Medicine | Admitting: Physical Therapy

## 2020-09-13 DIAGNOSIS — M6281 Muscle weakness (generalized): Secondary | ICD-10-CM | POA: Insufficient documentation

## 2020-09-13 DIAGNOSIS — M545 Low back pain, unspecified: Secondary | ICD-10-CM | POA: Diagnosis not present

## 2020-09-13 NOTE — Patient Instructions (Signed)
Access Code: [email or web address] URL: [email or web address] Date: 09/13/2020 Prepared by: Daleen Bo  Exercises Supine Lower Trunk Rotation - 1 x daily - 7 x weekly - 1 sets - 10 reps - 3s hold Supine Bridge - 1 x daily - 7 x weekly - 2 sets - 10 reps Standing Anti-Rotation Press with Anchored Resistance - 1 x daily - 7 x weekly - 2 sets - 10 reps Half Kneeling Hip Flexor Stretch with Sidebend - 2 x daily - 7 x weekly - 1 sets - 3 reps - 30 hold Dead Bug - 1 x daily - 7 x weekly - 2 sets - 10 reps Side Lying Open Book - 1 x daily - 7 x weekly - 2 sets - 10 reps - 5 hold Thoracic Extension Mobilization on Foam Roll - 1 x daily - 7 x weekly - 2 sets - 10 reps - 5 hold

## 2020-09-13 NOTE — Therapy (Signed)
[facility] [address] [place], [place], [address] Phone: [phone]   Fax:  [phone]  Physical Therapy Re-Evaluation  Patient Details  Name: Kristie King MRN: [id number] Date of Birth: [birth year] Referring Provider (PT): Dr. Georgina Snell   Encounter Date: 09/13/2020   PT End of Session - 09/13/20 1613     Visit Number 5    Number of Visits 7    Date for PT Re-Evaluation 10/28/20    Authorization Type [employer]    PT Start Time 1600    PT Stop Time 1645    PT Time Calculation (min) 45 min    Activity Tolerance Patient tolerated treatment well    Behavior During Therapy [facility] for tasks assessed/performed             Past Medical History:  Diagnosis Date   AC (acromioclavicular) arthritis 11/13/2016   Allergy    SEASONAL   Cataract    BILATERAL-REMOVED   Cervical radiculopathy at C6 09/10/2016   CKD stage 2 due to type 2 diabetes mellitus ([place]) 04/14/2018   Diabetes mellitus without complication ([place])    History of lumbar laminectomy 2019   HTN (hypertension) 09/17/2012   Hyperlipidemia    Hyperlipidemia associated with type 2 diabetes mellitus ([place]) 09/17/2012   Hypertension associated with diabetes ([place]) 04/14/2018   Iron deficiency anemia 10/16/2016   Osteoarthritis of spine with radiculopathy, cervical region 08/03/2016   Vitamin D deficiency 10/16/2016    Past Surgical History:  Procedure Laterality Date   BACK SURGERY  03/30/2018   BUNIONECTOMY Right 02/18/2013   BUNIONECTOMY Left 04/01/2013   CATARACTS      There were no vitals filed for this visit.   Subjective Assessment - 09/13/20 1604     Subjective Pt states she did very well after the last session. She had decreased side/flank pain after last session but now it has returned. Pt continues to do Zumba but will have continued flank pain in the evenings. Pt denies weight loss and systemic symptoms.    Pertinent History Lumbar laminectomy 2020     Patient Stated Goals reduce lumbar and flank pain.    Currently in Pain? Yes    Pain Score 3     Pain Location Abdomen    Pain Orientation Left    Pain Descriptors / Indicators Sharp    Pain Type Chronic pain                [facility] PT Assessment - 09/13/20 0001       Assessment   Medical Diagnosis Left Flank and LBP    Referring Provider (PT) Dr. Georgina Snell    Prior Therapy LBP      Precautions   Precautions None      Restrictions   Weight Bearing Restrictions No      Balance Screen   Has the patient fallen in the past 6 months No      [place] residence    Living Arrangements Spouse/significant other      Prior Function   Level of [place] Retired    Leisure exercise, travel, house projects, dance      Cognition   Overall Cognitive Status Within Functional Limits for tasks assessed      Observation/Other Assessments   Other Surveys  Oswestry Disability Index    Oswestry Disability Index  18% (9/50)      Posture/Postural Control   Posture/Postural Control  No significant limitations      AROM   Overall AROM Comments L/S WFL; pain with R rot on L flank, stretching with R SB      PROM   Overall PROM  Deficits    Overall PROM Comments Bilat hip WFL      Strength   Overall Strength Comments 4+/5 bilateral hips      Palpation   Spinal mobility no significant abnormality, stiffness into extension L1-5 with CPA    Palpation comment TTP and recreation of pain along stiffness/tissue restriction within obliques and abdominals on L;               Ambulation/Gait   Ambulation/Gait Yes    Gait Pattern Within Functional Limits                           [facility] Adult PT Treatment/Exercise - 09/13/20 0001       Exercises   Exercises Lumbar      Lumbar Exercises: Stretches       Other Lumbar Stretch Exercise prone press up 15x 3s hold      Lumbar Exercises: Supine   Other Supine  Lumbar Exercises self foam roll and T/S extension 15x 5s hold               Manual Therapy   Manual Therapy Joint mobilization    Joint Mobilization T4-10 CPA and UPA grade III-IVA, T5-7 CPA grade V    Soft tissue mobilization bilateral T/S              Improved global hip and L/S extension after session      PT Education - 09/13/20 1613     Education Details rest/recovery/nutrition, load management, need for further referral, HEP, POC    Person(s) Educated Patient    Methods Explanation;Demonstration;Tactile cues;Verbal cues    Comprehension Verbalized understanding;Returned demonstration;Verbal cues required;Tactile cues required;Need further instruction              PT Short Term Goals - 09/13/20 1706       PT SHORT TERM GOAL #1   Title Pt will become independent with HEP in order to demonstrate synthesis of PT education.    Time 2    Period Weeks    Status Achieved      PT SHORT TERM GOAL #2   Title Pt will report at least 2 pt reduction on VAS scale for pain in order to demonstrate funcitonal improvement with ADL.    Time 3    Period Weeks    Status Achieved               PT Long Term Goals - 09/13/20 1706       PT LONG TERM GOAL #1   Title Pt will become independent with final HEP in order to demonstrate synthesis of PT education.    Time 6    Period Weeks    Status Achieved      PT LONG TERM GOAL #2   Title Pt will be able to demonstrate ability to dance/spin without pain in order to demonstrate functionl improvement and tolerance to low level plyometric loading during dance related activity.    Time 6    Period Weeks    Status Partially Met      PT LONG TERM GOAL #3   Title Pt will demonstrate at least a 12.8 improvement in Oswestry Index in order to demonstrate a clinically  significant change in LBP and function.    Time 6    Period Weeks    Status On-going                   Plan - 09/13/20 1613     Clinical  Impression Statement Pt demonstrates improved hip strength and L/S ROM at re-assessment but continues to complain of L sided abdominal pain. Pt had palpable triggerpoints and banding through L obliques at beginning of session that was relieved with STM and joint mobilization. Pt had total abolishment of pain following session. Pt also had improved L/S and hip extension following manual and exercise. HEP was updated to perform self abdominal release as well as more anteriorly directed stretch. Pt had improved strength and less pain since initial measures but had significant increase in improvement following today's session. Pt to continue with decreased frequency of visits and re-check in 1 week to ensure lasting reduction of pain. Pt would benefit from continued skilled therapy in order to reach goals and maximize functional lumbopelvic strength and ROM for full return to full exericse and activity.    Personal Factors and Comorbidities Comorbidity 1;Age;Time since onset of injury/illness/exacerbation    Comorbidities previous LBP    Examination-Activity Limitations Carry;Sit;Transfers;Sleep    Examination-Participation Restrictions Driving;Yard Work;Community Activity    Stability/Clinical Decision Making Stable/Uncomplicated    Rehab Potential Excellent    PT Frequency 1x / week    PT Duration 6 weeks    PT Treatment/Interventions ADLs/Self Care Home Management;Cryotherapy;Electrical Stimulation;Moist Heat;Functional mobility training;Neuromuscular re-education;Therapeutic exercise;Therapeutic activities;Patient/family education;Manual techniques;Passive range of motion;Taping;Dry needling;Iontophoresis 52m/ml Dexamethasone;Ultrasound;Traction;Gait training;Stair training;Balance training    PT Next Visit Plan --    PT Home Exercise Plan [phone]   Consulted and Agree with Plan of Care Patient             Patient will benefit from skilled therapeutic intervention in order to improve the  following deficits and impairments:  Decreased activity tolerance, Impaired flexibility, Pain, Improper body mechanics, Decreased range of motion, Increased muscle spasms  Visit Diagnosis: Pain, lumbar region  Muscle weakness (generalized)     Problem List Patient Active Problem List   Diagnosis Date Noted   Hypercholesteremia 05/24/2020   Anemia of chronic disease 05/18/2020   Hx of decompressive lumbar laminectomy 04/17/2018   Hypertension associated with diabetes ([place] 04/14/2018   Sarcoidosis, with skin manifestations 04/14/2018   Allergic rhinitis 04/14/2018   Lumbar spinal stenosis 01/21/2017   AC (acromioclavicular) arthritis 11/13/2016   Vitamin D deficiency 10/16/2016   Cervical radiculopathy at C6 09/10/2016   Osteoarthritis of spine with radiculopathy, cervical region 08/03/2016   Rotator cuff tear 04/12/2014   Synovitis of knee 09/01/2013   Type 2 diabetes mellitus with other specified complication ([facility]    ADaleen BoPT, DPT 09/13/20 5:08 PM   [facility] [address] [address]Phone: [phone]  Fax:  [phone] Name: Kristie HUBERTYMRN: [id number] of Birth: 1Jun 04, 1952

## 2020-09-14 ENCOUNTER — Telehealth: Payer: Self-pay

## 2020-09-14 DIAGNOSIS — R040 Epistaxis: Secondary | ICD-10-CM | POA: Diagnosis not present

## 2020-09-14 NOTE — Telephone Encounter (Signed)
  LAST APPOINTMENT DATE: 06/22/2020  NEXT APPOINTMENT DATE:@Visit  date not found  MEDICATION: FUROSEMIDE 40 mg  Is the patient out of medication?  Yes   PHARMACY: [id number] - [place], [place] - [address]

## 2020-09-15 NOTE — Telephone Encounter (Signed)
Spoke to pt told her I can not refill Furosemide medication it was discontinued and has not been prescribed since 2021. Asked pt if she has been taking the medication. Pt said as needed for swelling in ankles, lower legs and hands. Told pt I will have a scheduler call her and get her an appt sooner so she can get her medication. Pt verbalized understanding.

## 2020-09-15 NOTE — Telephone Encounter (Signed)
Patient has been scheduled for Monday with Alyssa.

## 2020-09-18 ENCOUNTER — Ambulatory Visit (INDEPENDENT_AMBULATORY_CARE_PROVIDER_SITE_OTHER): Payer: Medicare HMO | Admitting: Physician Assistant

## 2020-09-18 ENCOUNTER — Other Ambulatory Visit: Payer: Self-pay

## 2020-09-18 ENCOUNTER — Encounter: Payer: Self-pay | Admitting: Physician Assistant

## 2020-09-18 VITALS — BP 136/83 | HR 65 | Temp 97.8°F | Ht 63.0 in | Wt 149.5 lb

## 2020-09-18 DIAGNOSIS — R601 Generalized edema: Secondary | ICD-10-CM | POA: Diagnosis not present

## 2020-09-18 MED ORDER — FUROSEMIDE 40 MG PO TABS: ORAL_TABLET | ORAL | 0 refills | Status: DC

## 2020-09-18 NOTE — Progress Notes (Signed)
Established Patient Office Visit  Subjective:  Patient ID: Kristie King, female    DOB: 10-07-1950  Age: 70 y.o. MRN: [id number]  CC:  Chief Complaint  Patient presents with   Leg Swelling    HPI Kristie King presents for refill of her Lasix.  Patient states that she has a history of intermittent generalized pain and swelling in her legs, ankles, hands.  She started to have a flareup of swelling a few weeks ago in these areas and she took 4 tablets of her Lasix, which resolved the symptoms. She does not have a history of congestive heart failure. No autoimmune hx. She denies any chest pain or shortness of breath.  She is retired and likes to stay very active.  Past Medical History:  Diagnosis Date   AC (acromioclavicular) arthritis 11/13/2016   Allergy    SEASONAL   Cataract    BILATERAL-REMOVED   Cervical radiculopathy at C6 09/10/2016   CKD stage 2 due to type 2 diabetes mellitus ([place]) 04/14/2018   Diabetes mellitus without complication ([facility])    History of lumbar laminectomy 2019   HTN (hypertension) 09/17/2012   Hyperlipidemia    Hyperlipidemia associated with type 2 diabetes mellitus ([place]) 09/17/2012   Hypertension associated with diabetes ([place]) 04/14/2018   Iron deficiency anemia 10/16/2016   Osteoarthritis of spine with radiculopathy, cervical region 08/03/2016   Vitamin D deficiency 10/16/2016    Past Surgical History:  Procedure Laterality Date   BACK SURGERY  03/30/2018   BUNIONECTOMY Right 02/18/2013   BUNIONECTOMY Left 04/01/2013   CATARACTS      Family History  Problem Relation Age of Onset   Stroke Mother    Stroke Father    Hyperlipidemia Sister    Diabetes Brother    Hypertension Brother    Diabetes Sister    Hypertension Sister    Stroke Maternal Grandfather    Colon cancer Neg Hx    Stomach cancer Neg Hx    Esophageal cancer Neg Hx    Pancreatic cancer Neg Hx    Colon polyps Neg Hx    Ulcerative colitis Neg Hx     Social History   Socioeconomic  History   Marital status: Married    Spouse name: Not on file   Number of children: 0   Years of education: Not on file   Highest education level: Not on file  Occupational History   Occupation: Retired    Fish farm manager: [employer]  Tobacco Use   Smoking status: Former    Years: 10.00    Types: Cigarettes    Quit date: 01/15/1971    Years since quitting: 49.7   Smokeless tobacco: Never  Vaping Use   Vaping Use: Never used  Substance and Sexual Activity   Alcohol use: Yes    Comment: 09/17/2012 "drink on special occasions only"   Drug use: No   Sexual activity: Yes    Partners: Male  Other Topics Concern   Not on file  Social History Narrative   Increased stress with impending death of brother whom lives in [place] Determinants of Health   Financial Resource Strain: Not on file  Food Insecurity: Not on file  Transportation Needs: Not on file  Physical Activity: Not on file  Stress: Not on file  Social Connections: Not on file  Intimate Partner Violence: Not on file    Outpatient Medications Prior to Visit  Medication Sig Dispense Refill   azelastine (ASTELIN) 0.1 %  nasal spray PLACE 1 SPRAY INTO BOTH NOSTRILS IN THE MORNING. USE IN EACH NOSTRIL AS DIRECTED 30 mL 1   traZODone (DESYREL) 50 MG tablet TAKE 1/2 TO 1 TABLET BY MOUTH AT BEDTIME AS NEEDED FOR SLEEP 90 tablet 1   TRULICITY 1.5 KG/4.0NU SOPN INJECT 0.5 MLS INTO THE SKIN ONCE A WEEK. 4 mL 3   TIZANIDINE HCL PO Take by mouth.     rosuvastatin (CRESTOR) 10 MG tablet Take 1 tablet (10 mg total) by mouth daily. 90 tablet 3   cyclobenzaprine (FLEXERIL) 10 MG tablet Take 1 tablet (10 mg total) by mouth 3 (three) times daily as needed for muscle spasms. 30 tablet 0   MAXITROL 3.5-10000-0.1 ophthalmic suspension Apply to eye.     metFORMIN (GLUCOPHAGE) 1000 MG tablet Take 1 tablet (1,000 mg total) by mouth daily with breakfast. Mon, wed fri 90 tablet 1   neomycin-polymyxin b-dexamethasone (MAXITROL) 2.7-25366-4.4 OINT 1  application 2 (two) times daily.     prednisoLONE acetate (PRED FORTE) 1 % ophthalmic suspension Place into the left eye.     Vitamin D, Ergocalciferol, (DRISDOL) 1.25 MG (50000 UNIT) CAPS capsule One capsule by mouth once a week for 12 weeks. Then take 5000IU/day of D3 daily 12 capsule 0   No facility-administered medications prior to visit.    No Known Allergies  ROS Review of Systems REFER TO HPI FOR PERTINENT POSITIVES AND NEGATIVES    Objective:    Physical Exam Vitals and nursing note reviewed.  Constitutional:      General: She is not in acute distress.    Appearance: Normal appearance. She is normal weight.  HENT:     Head: Normocephalic.  Cardiovascular:     Rate and Rhythm: Normal rate and regular rhythm.     Pulses: Normal pulses.     Heart sounds: No murmur heard. Pulmonary:     Effort: Pulmonary effort is normal.     Breath sounds: Normal breath sounds.  Abdominal:     Tenderness: There is no abdominal tenderness.  Musculoskeletal:        General: No swelling or tenderness. Normal range of motion.     Cervical back: Normal range of motion.     Right lower leg: No edema.     Left lower leg: No edema.  Skin:    General: Skin is warm.  Neurological:     General: No focal deficit present.     Mental Status: She is alert and oriented to person, place, and time.     Gait: Gait normal.  Psychiatric:        Mood and Affect: Mood normal.        Behavior: Behavior normal.    BP 136/83   Pulse 65   Temp 97.8 F (36.6 C)   Ht 5\' 3"  (1.6 m)   Wt 149 lb 8 oz (67.8 kg)   SpO2 99%   BMI 26.48 kg/m  Wt Readings from Last 3 Encounters:  09/18/20 149 lb 8 oz (67.8 kg)  06/27/20 148 lb 4.8 oz (67.3 kg)  06/22/20 149 lb 3.2 oz (67.7 kg)     Health Maintenance Due  Topic Date Due   FOOT EXAM  Never done   TETANUS/TDAP  Never done   Zoster Vaccines- Shingrix (1 of 2) Never done   COVID-19 Vaccine (3 - Pfizer risk series) 05/27/2019   OPHTHALMOLOGY EXAM   02/18/2020    There are no preventive care reminders to display for this patient.  Lab Results  Component Value Date   TSH 0.67 10/18/2019   Lab Results  Component Value Date   WBC 5.5 05/18/2020   HGB 11.6 (L) 05/18/2020   HCT 34.8 (L) 05/18/2020   MCV 88.6 05/18/2020   PLT 273.0 05/18/2020   Lab Results  Component Value Date   NA 139 05/18/2020   K 3.2 (L) 05/18/2020   CO2 31 05/18/2020   GLUCOSE 72 05/18/2020   BUN 25 (H) 05/18/2020   CREATININE 0.92 05/18/2020   BILITOT 0.6 05/18/2020   ALKPHOS 51 05/18/2020   AST 18 05/18/2020   ALT 5 05/18/2020   PROT 7.2 05/18/2020   ALBUMIN 4.3 05/18/2020   CALCIUM 9.9 05/18/2020   ANIONGAP 7 10/11/2019   GFR 63.61 05/18/2020   Lab Results  Component Value Date   CHOL 300 (H) 05/18/2020   Lab Results  Component Value Date   HDL 160.00 05/18/2020   Lab Results  Component Value Date   LDLCALC 131 (H) 05/18/2020   Lab Results  Component Value Date   TRIG 49.0 05/18/2020   Lab Results  Component Value Date   CHOLHDL 2 05/18/2020   Lab Results  Component Value Date   HGBA1C 6.2 05/18/2020      Assessment & Plan:   Problem List Items Addressed This Visit   None Visit Diagnoses     Generalized edema    -  Primary       No orders of the defined types were placed in this encounter.   Follow-up: No follow-ups on file.   1. Generalized edema Resolved; intermittent. She has had Rx for Lasix 40 mg to use prn for multiple years. Medication refilled today. Cautioned use - she does need to eat a banana with this to prevent low potassium. Should she ever have orthopnea, severe edema, or SOB, she needs to call to be seen. Pt agreeable and understanding about use of this medication and risks vs benefits.  Kimbly Eanes M Harry Shuck, PA-C

## 2020-09-21 ENCOUNTER — Encounter (HOSPITAL_BASED_OUTPATIENT_CLINIC_OR_DEPARTMENT_OTHER): Payer: Self-pay | Admitting: Physical Therapy

## 2020-09-21 ENCOUNTER — Other Ambulatory Visit: Payer: Self-pay

## 2020-09-21 ENCOUNTER — Ambulatory Visit (HOSPITAL_BASED_OUTPATIENT_CLINIC_OR_DEPARTMENT_OTHER): Payer: Medicare HMO | Admitting: Physical Therapy

## 2020-09-21 DIAGNOSIS — M6281 Muscle weakness (generalized): Secondary | ICD-10-CM

## 2020-09-21 DIAGNOSIS — M545 Low back pain, unspecified: Secondary | ICD-10-CM

## 2020-09-21 NOTE — Therapy (Addendum)
[facility] [address] [place], [place], [address] Phone: [phone]   Fax:  [phone]  Physical Therapy Treatment  Patient Details  Name: Kristie King MRN: [id number] Date of Birth: [birth year] Referring Provider (PT): Dr. Georgina Snell   Encounter Date: 09/21/2020   PT End of Session - 09/21/20 1428     Visit Number 6    Number of Visits 7    Date for PT Re-Evaluation 10/28/20    Authorization Type [employer]    PT Start Time 1430    PT Stop Time 1510    PT Time Calculation (min) 40 min    Activity Tolerance Patient tolerated treatment well    Behavior During Therapy [facility] for tasks assessed/performed             Past Medical History:  Diagnosis Date   AC (acromioclavicular) arthritis 11/13/2016   Allergy    SEASONAL   Cataract    BILATERAL-REMOVED   Cervical radiculopathy at C6 09/10/2016   CKD stage 2 due to type 2 diabetes mellitus ([place]) 04/14/2018   Diabetes mellitus without complication ([place])    History of lumbar laminectomy 2019   HTN (hypertension) 09/17/2012   Hyperlipidemia    Hyperlipidemia associated with type 2 diabetes mellitus ([place]) 09/17/2012   Hypertension associated with diabetes ([place]) 04/14/2018   Iron deficiency anemia 10/16/2016   Osteoarthritis of spine with radiculopathy, cervical region 08/03/2016   Vitamin D deficiency 10/16/2016    Past Surgical History:  Procedure Laterality Date   BACK SURGERY  03/30/2018   BUNIONECTOMY Right 02/18/2013   BUNIONECTOMY Left 04/01/2013   CATARACTS      There were no vitals filed for this visit.   Subjective Assessment - 09/21/20 1430     Subjective Pt states the pain feels better since last session but the pain persists. She has to manage the pain with the massage and exercise. She states is between a 6/10 now where it was a 9/10 before. The pain is described as sore. The pain comes back every day.    Pertinent History Lumbar laminectomy 2020     Patient Stated Goals reduce lumbar and flank pain.    Currently in Pain? No/denies                                [facility] Adult PT Treatment/Exercise - 09/21/20 0001       Exercises   Exercises Lumbar      Lumbar Exercises: Stretches   Lower Trunk Rotation Limitations 10x 3s  QL stretch seated 30s 3x   Other Lumbar Stretch Exercise childs pose 10s 5x      Lumbar Exercises: Supine   Other Supine Lumbar Exercises self foam roll and T/S extension 15x 5s hold      Lumbar Exercises: Quadruped   [place] Horse Limitations 10x 5s      Manual Therapy   Manual Therapy Joint mobilization    Joint Mobilization T4-10 CPA and UPA grade III-IVA, T5-7 CPA grade V    Soft tissue mobilization bilateral T/S and L/S; skilled palpation and monitoring of soft tissue to assess DN response             Trigger Point Dry Needling - 09/21/20 0001     Consent Given? Yes    Education Handout Provided Yes    Muscles Treated Back/Hip Erector spinae; L/S shelfing; .30x30m    Erector spinae Response Twitch  response elicited;Palpable increased muscle length                  PT Short Term Goals - 09/13/20 1706       PT SHORT TERM GOAL #1   Title Pt will become independent with HEP in order to demonstrate synthesis of PT education.    Time 2    Period Weeks    Status Achieved      PT SHORT TERM GOAL #2   Title Pt will report at least 2 pt reduction on VAS scale for pain in order to demonstrate funcitonal improvement with ADL.    Time 3    Period Weeks    Status Achieved               PT Long Term Goals - 09/13/20 1706       PT LONG TERM GOAL #1   Title Pt will become independent with final HEP in order to demonstrate synthesis of PT education.    Time 6    Period Weeks    Status Achieved      PT LONG TERM GOAL #2   Title Pt will be able to demonstrate ability to dance/spin without pain in order to demonstrate functionl improvement and tolerance to  low level plyometric loading during dance related activity.    Time 6    Period Weeks    Status Partially Met      PT LONG TERM GOAL #3   Title Pt will demonstrate at least a 12.8 improvement in Oswestry Index in order to demonstrate a clinically significant change in LBP and function.    Time 6    Period Weeks    Status On-going                   Plan - 09/21/20 1428     Clinical Impression Statement Pt presents today with increased L/S paraspinals muscle hypertonicity and referred pain from erector spinae. Pt tolerated DN well with report zero/no pain at end of session. Pt also had improve soft tissue extensibility following session. Pt's s/s and healing timeline does not follow expected trajectory. Pt pain does appear to still be mechanical in nature but does not respond with expected level of improvement with stretching and exercise. Pt is scheduled to see a specialist to seek further imaging to further characterize her L sided back/flank pain. Plan to await physician visit before continuing with therapy.    Personal Factors and Comorbidities Comorbidity 1;Age;Time since onset of injury/illness/exacerbation    Comorbidities previous LBP    Examination-Activity Limitations Carry;Sit;Transfers;Sleep    Examination-Participation Restrictions Driving;Yard Work;Community Activity    Stability/Clinical Decision Making Stable/Uncomplicated    Rehab Potential Excellent    PT Frequency 1x / week    PT Duration 6 weeks    PT Treatment/Interventions ADLs/Self Care Home Management;Cryotherapy;Electrical Stimulation;Moist Heat;Functional mobility training;Neuromuscular re-education;Therapeutic exercise;Therapeutic activities;Patient/family education;Manual techniques;Passive range of motion;Taping;Dry needling;Iontophoresis 35m/ml Dexamethasone;Ultrasound;Traction;Gait training;Stair training;Balance training    PT Home Exercise Plan [id number]   Consulted and Agree with Plan of Care Patient              Patient will benefit from skilled therapeutic intervention in order to improve the following deficits and impairments:  Decreased activity tolerance, Impaired flexibility, Pain, Improper body mechanics, Decreased range of motion, Increased muscle spasms  Visit Diagnosis: Pain, lumbar region  Muscle weakness (generalized)     Problem List Patient Active Problem List   Diagnosis Date Noted  Hypercholesteremia 05/24/2020   Anemia of chronic disease 05/18/2020   Hx of decompressive lumbar laminectomy 04/17/2018   Hypertension associated with diabetes ([place]) 04/14/2018   Sarcoidosis, with skin manifestations 04/14/2018   Allergic rhinitis 04/14/2018   Lumbar spinal stenosis 01/21/2017   AC (acromioclavicular) arthritis 11/13/2016   Vitamin D deficiency 10/16/2016   Cervical radiculopathy at C6 09/10/2016   Osteoarthritis of spine with radiculopathy, cervical region 08/03/2016   Rotator cuff tear 04/12/2014   Synovitis of knee 09/01/2013   Type 2 diabetes mellitus with other specified complication [facility])     Daleen Bo PT, DPT 09/21/20 4:09 PM   [facility] [place], [place], [address] Phone: [phone]   Fax:  [phone]  Name: Kristie King MRN: [id number] Date of Birth: [birth year]   PHYSICAL THERAPY DISCHARGE SUMMARY  Visits from Start of Care: 6  Current functional level related to goals / functional outcomes: See above   Remaining deficits: See above    Education / Equipment: Anatomy of condition, POC, HEP, exercise form/rationale    Patient agrees to discharge. Patient goals were partially met. Patient is being discharged due to not returning since the last visit. Jessica C. Hightower PT, DPT 04/25/22 11:15 AM

## 2020-09-27 ENCOUNTER — Ambulatory Visit (HOSPITAL_BASED_OUTPATIENT_CLINIC_OR_DEPARTMENT_OTHER): Payer: Medicare HMO | Admitting: Internal Medicine

## 2020-10-02 ENCOUNTER — Telehealth: Payer: Self-pay

## 2020-10-02 DIAGNOSIS — Z79899 Other long term (current) drug therapy: Secondary | ICD-10-CM | POA: Diagnosis not present

## 2020-10-02 DIAGNOSIS — A048 Other specified bacterial intestinal infections: Secondary | ICD-10-CM

## 2020-10-02 DIAGNOSIS — Z79891 Long term (current) use of opiate analgesic: Secondary | ICD-10-CM | POA: Diagnosis not present

## 2020-10-02 DIAGNOSIS — M545 Low back pain, unspecified: Secondary | ICD-10-CM | POA: Diagnosis not present

## 2020-10-02 DIAGNOSIS — G894 Chronic pain syndrome: Secondary | ICD-10-CM | POA: Diagnosis not present

## 2020-10-02 DIAGNOSIS — M898X8 Other specified disorders of bone, other site: Secondary | ICD-10-CM | POA: Diagnosis not present

## 2020-10-02 DIAGNOSIS — R0781 Pleurodynia: Secondary | ICD-10-CM | POA: Diagnosis not present

## 2020-10-02 NOTE — Telephone Encounter (Signed)
Left message on machine to call back    Mansouraty, Telford Nab., MD  Timothy Lasso, RN Kristie King,  Please reach out to the patient and see if he is willing to have the H. pylori stool antigen performed so that we can show that he has eradication of his previous H. pylori infection.  Thanks.  GM

## 2020-10-03 NOTE — Telephone Encounter (Signed)
Left message on machine to call back  

## 2020-10-04 NOTE — Telephone Encounter (Signed)
The pt returned call and will come in this week to pick up stool kit.  She states she is not currently taking PPI.  The order has been entered.

## 2020-10-04 NOTE — Telephone Encounter (Signed)
Left message on machine to call back  I have been unable to reach the pt by phone.  I will mail a letter to have the pt contact our office to discuss.

## 2020-10-04 NOTE — Addendum Note (Signed)
Addended by: Timothy Lasso on: 10/04/2020 03:39 PM   Modules accepted: Orders

## 2020-10-05 ENCOUNTER — Telehealth: Payer: Self-pay

## 2020-10-05 NOTE — Telephone Encounter (Signed)
Pt called asking for some advice before she goes to an urgent care. She stated that she fell and she is concerned she may need an X-ray.

## 2020-10-05 NOTE — Telephone Encounter (Signed)
Spoke to pt she said she fell down the stairs yesterday and her Right lower leg is black and some swelling and discomfort, also her right knee is bothering her today. Pt said she took Advil and put some cream on her leg. Told pt she needs to go to Urgent care to be evaluated and have possible x-ray done to make sure she is okay. Pt verbalized understanding and will go to Urgent care.

## 2020-10-06 ENCOUNTER — Ambulatory Visit (INDEPENDENT_AMBULATORY_CARE_PROVIDER_SITE_OTHER): Payer: Medicare HMO

## 2020-10-06 ENCOUNTER — Encounter (HOSPITAL_COMMUNITY): Payer: Self-pay

## 2020-10-06 ENCOUNTER — Ambulatory Visit (HOSPITAL_COMMUNITY)
Admission: EM | Admit: 2020-10-06 | Discharge: 2020-10-06 | Disposition: A | Discharge status: Home / Self Care | Payer: Medicare HMO | Attending: Medical Oncology | Admitting: Medical Oncology

## 2020-10-06 ENCOUNTER — Other Ambulatory Visit: Payer: Self-pay

## 2020-10-06 DIAGNOSIS — M25561 Pain in right knee: Secondary | ICD-10-CM

## 2020-10-06 DIAGNOSIS — W19XXXA Unspecified fall, initial encounter: Secondary | ICD-10-CM | POA: Diagnosis not present

## 2020-10-06 DIAGNOSIS — T148XXA Other injury of unspecified body region, initial encounter: Secondary | ICD-10-CM | POA: Diagnosis not present

## 2020-10-06 DIAGNOSIS — R600 Localized edema: Secondary | ICD-10-CM | POA: Diagnosis not present

## 2020-10-06 MED ORDER — TIZANIDINE HCL 4 MG PO TABS: 4.0000 mg | ORAL_TABLET | Freq: Four times a day (QID) | ORAL | 0 refills | Status: DC | PRN

## 2020-10-06 NOTE — ED Provider Notes (Signed)
[facility]    CSN: [id number] Arrival date & time: 10/06/20  1034      History   Chief Complaint Chief Complaint  Patient presents with   Fall    HPI Kristie King is a 70 y.o. female.   HPI  Fall: Patient reports that about 2 days ago she accidentally slipped down the last few steps going down stairs.  She states that she hit her right lower shin and felt like she twisted her right knee.  She denies any other injuries and denies hitting her head or having loss of consciousness.  Since this time she has had a bit of soreness of the right lower leg with a few skin abrasions that are healing well.  She states that her knee is giving her the most trouble as it feels a bit swollen in the front and a bit "squirley".  She has tried Advil which is helped some.  She states that she has been able to walk on the limits and has been able to work out.  No previous injury.  Of note she states that she does have a bit of muscle tightness in her left trapezius muscle secondary to the fall.  Past Medical History:  Diagnosis Date   AC (acromioclavicular) arthritis 11/13/2016   Allergy    SEASONAL   Cataract    BILATERAL-REMOVED   Cervical radiculopathy at C6 09/10/2016   CKD stage 2 due to type 2 diabetes mellitus ([place]) 04/14/2018   Diabetes mellitus without complication ([facility])    History of lumbar laminectomy 2019   HTN (hypertension) 09/17/2012   Hyperlipidemia    Hyperlipidemia associated with type 2 diabetes mellitus ([place]) 09/17/2012   Hypertension associated with diabetes ([place]) 04/14/2018   Iron deficiency anemia 10/16/2016   Osteoarthritis of spine with radiculopathy, cervical region 08/03/2016   Vitamin D deficiency 10/16/2016    Patient Active Problem List   Diagnosis Date Noted   Hypercholesteremia 05/24/2020   Anemia of chronic disease 05/18/2020   Hx of decompressive lumbar laminectomy 04/17/2018   Hypertension associated with diabetes ([place]) 04/14/2018   Sarcoidosis, with  skin manifestations 04/14/2018   Allergic rhinitis 04/14/2018   Lumbar spinal stenosis 01/21/2017   AC (acromioclavicular) arthritis 11/13/2016   Vitamin D deficiency 10/16/2016   Cervical radiculopathy at C6 09/10/2016   Osteoarthritis of spine with radiculopathy, cervical region 08/03/2016   Rotator cuff tear 04/12/2014   Synovitis of knee 09/01/2013   Type 2 diabetes mellitus with other specified complication [facility])     Past Surgical History:  Procedure Laterality Date   BACK SURGERY  03/30/2018   BUNIONECTOMY Right 02/18/2013   BUNIONECTOMY Left 04/01/2013   CATARACTS      OB History   No obstetric history on file.      Home Medications    Prior to Admission medications   Medication Sig Start Date End Date Taking? Authorizing Provider  azelastine (ASTELIN) 0.1 % nasal spray PLACE 1 SPRAY INTO BOTH NOSTRILS IN THE MORNING. USE IN EACH NOSTRIL AS DIRECTED 02/04/20   Orma Flaming, MD  furosemide (LASIX) 40 MG tablet Take 1/2 to 1 tab qd prn edema. 09/18/20   Allwardt, Randa Evens, PA-C  rosuvastatin (CRESTOR) 10 MG tablet Take 1 tablet (10 mg total) by mouth daily. 06/07/20   Orma Flaming, MD  traZODone (DESYREL) 50 MG tablet TAKE 1/2 TO 1 TABLET BY MOUTH AT BEDTIME AS NEEDED FOR SLEEP 01/10/20   Orma Flaming, MD  TRULICITY 1.5 0000000 [facility]  INJECT 0.5 MLS INTO THE SKIN ONCE A WEEK. 07/14/20   Orma Flaming, MD    Family History Family History  Problem Relation Age of Onset   Stroke Mother    Stroke Father    Hyperlipidemia Sister    Diabetes Brother    Hypertension Brother    Diabetes Sister    Hypertension Sister    Stroke Maternal Grandfather    Colon cancer Neg Hx    Stomach cancer Neg Hx    Esophageal cancer Neg Hx    Pancreatic cancer Neg Hx    Colon polyps Neg Hx    Ulcerative colitis Neg Hx     Social History Social History   Tobacco Use   Smoking status: Former    Years: 10.00    Types: Cigarettes    Quit date: 01/15/1971    Years since quitting:  49.7   Smokeless tobacco: Never  Vaping Use   Vaping Use: Never used  Substance Use Topics   Alcohol use: Yes    Comment: 09/17/2012 "drink on special occasions only"   Drug use: No     Allergies   Patient has no known allergies.   Review of Systems Review of Systems  As stated above in HPI Physical Exam Triage Vital Signs ED Triage Vitals  Enc Vitals Group     BP 10/06/20 1151 (!) 141/77     Pulse Rate 10/06/20 1151 75     Resp 10/06/20 1151 18     Temp 10/06/20 1151 98 F (36.7 C)     Temp Source 10/06/20 1151 Oral     SpO2 10/06/20 1151 98 %     Weight --      Height --      Head Circumference --      Peak Flow --      Pain Score 10/06/20 1152 6     Pain Loc --      Pain Edu? --      Excl. in [place]? --    No data found.  Updated Vital Signs BP (!) 141/77 (BP Location: Right Arm)   Pulse 75   Temp 98 F (36.7 C) (Oral)   Resp 18   SpO2 98%    Physical Exam Vitals and nursing note reviewed.  Constitutional:      General: She is not in acute distress.    Appearance: Normal appearance. She is not ill-appearing, toxic-appearing or diaphoretic.  Musculoskeletal:     Right knee: Effusion (mild to moderate anterior) and crepitus present. No erythema, ecchymosis, lacerations or bony tenderness. Normal range of motion. No tenderness. No LCL laxity, MCL laxity, ACL laxity or PCL laxity. Normal alignment, normal meniscus and normal patellar mobility. Normal pulse.     Instability Tests: Anterior drawer test negative. Posterior drawer test negative. Anterior Lachman test negative. Medial McMurray test negative and lateral McMurray test negative.     Left knee: Normal.     Right lower leg: Normal. No deformity, lacerations, tenderness or bony tenderness.     Left lower leg: Normal. No deformity, lacerations, tenderness or bony tenderness.     Right ankle: Normal.     Left ankle: Normal.  Skin:    General: Skin is warm.     Comments: Two small superficial abrasions of  the right lower leg which appear to be healing well without discharge or bleeding.   Neurological:     Mental Status: She is alert.     UC Treatments / Results  Labs (all labs ordered are listed, but only abnormal results are displayed) Labs Reviewed - No data to display  EKG   Radiology No results found.  Procedures Procedures (including critical care time)  Medications Ordered in UC Medications - No data to display  Initial Impression / Assessment and Plan / UC Course  I have reviewed the triage vital signs and the nursing notes.  Pertinent labs & imaging results that were available during my care of the patient were reviewed by me and considered in my medical decision making (see chart for details).     New. Pt requests x ray imaging of knee. Image pending. NO point tenderness of the Tibeal head.  Knee brace given.  Discussed RICE.  Discussed that symptoms will likely be present for about 2 weeks and if they still continue would recommend seeing orthopedics.  Also sending in tizanidine for her muscle soreness.  Discussed how to use along with common potential side effects and precautions.  Also discussed her Tdap vaccine which I have sent into the pharmacy for her given her insurance.   Final Clinical Impressions(s) / UC Diagnoses   Final diagnoses:  None   Discharge Instructions   None    ED Prescriptions   None    PDMP not reviewed this encounter.   Hughie Closs, [place] 10/06/20 1311

## 2020-10-06 NOTE — ED Triage Notes (Signed)
Pt presents with right leg and right knee pain after a fall X 2 days ago.

## 2020-11-10 ENCOUNTER — Other Ambulatory Visit: Payer: Self-pay

## 2020-11-10 ENCOUNTER — Encounter: Payer: Self-pay | Admitting: Physician Assistant

## 2020-11-10 ENCOUNTER — Ambulatory Visit (INDEPENDENT_AMBULATORY_CARE_PROVIDER_SITE_OTHER): Payer: Medicare HMO | Admitting: Physician Assistant

## 2020-11-10 VITALS — BP 156/87 | HR 61 | Temp 97.9°F | Ht 63.0 in | Wt 151.8 lb

## 2020-11-10 DIAGNOSIS — G8929 Other chronic pain: Secondary | ICD-10-CM

## 2020-11-10 DIAGNOSIS — M25561 Pain in right knee: Secondary | ICD-10-CM | POA: Diagnosis not present

## 2020-11-10 DIAGNOSIS — Z23 Encounter for immunization: Secondary | ICD-10-CM | POA: Diagnosis not present

## 2020-11-10 DIAGNOSIS — E1159 Type 2 diabetes mellitus with other circulatory complications: Secondary | ICD-10-CM

## 2020-11-10 DIAGNOSIS — I152 Hypertension secondary to endocrine disorders: Secondary | ICD-10-CM

## 2020-11-10 LAB — CBC WITH DIFFERENTIAL/PLATELET
Basophils Absolute: 0 10*3/uL (ref 0.0–0.1)
Basophils Relative: 0.5 % (ref 0.0–3.0)
Eosinophils Absolute: 0.1 10*3/uL (ref 0.0–0.7)
Eosinophils Relative: 2.3 % (ref 0.0–5.0)
HCT: 32 % — ABNORMAL LOW (ref 36.0–46.0)
Hemoglobin: 10.4 g/dL — ABNORMAL LOW (ref 12.0–15.0)
Lymphocytes Relative: 33.3 % (ref 12.0–46.0)
Lymphs Abs: 1.9 10*3/uL (ref 0.7–4.0)
MCHC: 32.5 g/dL (ref 30.0–36.0)
MCV: 88.4 fl (ref 78.0–100.0)
Monocytes Absolute: 0.6 10*3/uL (ref 0.1–1.0)
Monocytes Relative: 10.4 % (ref 3.0–12.0)
Neutro Abs: 3 10*3/uL (ref 1.4–7.7)
Neutrophils Relative %: 53.5 % (ref 43.0–77.0)
Platelets: 239 10*3/uL (ref 150.0–400.0)
RBC: 3.62 Mil/uL — ABNORMAL LOW (ref 3.87–5.11)
RDW: 13.4 % (ref 11.5–15.5)
WBC: 5.6 10*3/uL (ref 4.0–10.5)

## 2020-11-10 LAB — COMPREHENSIVE METABOLIC PANEL
ALT: 8 U/L (ref 0–35)
AST: 20 U/L (ref 0–37)
Albumin: 3.9 g/dL (ref 3.5–5.2)
Alkaline Phosphatase: 49 U/L (ref 39–117)
BUN: 21 mg/dL (ref 6–23)
CO2: 28 mEq/L (ref 19–32)
Calcium: 9.3 mg/dL (ref 8.4–10.5)
Chloride: 106 mEq/L (ref 96–112)
Creatinine, Ser: 0.88 mg/dL (ref 0.40–1.20)
GFR: 66.86 mL/min (ref >60.00)
Glucose, Bld: 81 mg/dL (ref 70–99)
Potassium: 3.5 mEq/L (ref 3.5–5.1)
Sodium: 141 mEq/L (ref 135–145)
Total Bilirubin: 0.6 mg/dL (ref 0.2–1.2)
Total Protein: 6.8 g/dL (ref 6.0–8.3)

## 2020-11-10 MED ORDER — LISINOPRIL 5 MG PO TABS: 5.0000 mg | ORAL_TABLET | Freq: Every day | ORAL | 0 refills | Status: DC

## 2020-11-10 NOTE — Progress Notes (Signed)
Kristie King is a 70 y.o. female here for a follow up of a pre-existing problem.  History of Present Illness:   Chief Complaint  Patient presents with   Knee Injury    Patient had a fall 6 weeks ago , Her xray cam back fine. She was wearing a brace and compression on her knee. Now that she has taken the brace and compression off she is now experiencing some problems with her knee.      HPI  R knee pain Patient had a fall 6 weeks ago and had an xray 10/06/20. Her xray showed --  IMPRESSION:  Mild irregularity of the fibular head is suggested on AP view, correlate with any point tenderness in the region of the fibular head. May represent sequela of remote injury or subtle fracture though there is no overlying soft tissue swelling.  She was wearing a brace and compression on her knee. Now that she has taken the brace and compression off she is now experiencing some problems with her knee.    Using tiger balm and blue emu cream. While walking on Wednesday she has had a pop on her knee and she felt like things "went back to place" in her knee and feels like her symptoms are  somewhat improved however she is having some ongoing tenderness and pain.  HTN Currently taking lasix 40 mg prn about 3 times a week. Was previously on 20 mg lisinopril daily but this was stopped when she experienced symptomatic hypotension. At home blood pressure readings are: normal per patient. Patient denies chest pain, SOB, blurred vision, dizziness, unusual headaches, lower leg swelling. Patient is compliant with medication. Denies excessive caffeine intake, stimulant usage, excessive alcohol intake, or increase in salt consumption.  BP Readings from Last 3 Encounters:  11/10/20 (!) 156/87  10/06/20 (!) 141/77  09/18/20 136/83        Past Medical History:  Diagnosis Date   AC (acromioclavicular) arthritis 11/13/2016   Allergy    SEASONAL   Cataract    BILATERAL-REMOVED   Cervical radiculopathy at C6  09/10/2016   CKD stage 2 due to type 2 diabetes mellitus ([place]) 04/14/2018   Diabetes mellitus without complication ([place])    History of lumbar laminectomy 2019   HTN (hypertension) 09/17/2012   Hyperlipidemia    Hyperlipidemia associated with type 2 diabetes mellitus ([place]) 09/17/2012   Hypertension associated with diabetes ([place]) 04/14/2018   Iron deficiency anemia 10/16/2016   Osteoarthritis of spine with radiculopathy, cervical region 08/03/2016   Vitamin D deficiency 10/16/2016     Social History   Tobacco Use   Smoking status: Former    Years: 10.00    Types: Cigarettes    Quit date: 01/15/1971    Years since quitting: 49.8   Smokeless tobacco: Never  Vaping Use   Vaping Use: Never used  Substance Use Topics   Alcohol use: Yes    Comment: 09/17/2012 "drink on special occasions only"   Drug use: No    Past Surgical History:  Procedure Laterality Date   BACK SURGERY  03/30/2018   BUNIONECTOMY Right 02/18/2013   BUNIONECTOMY Left 04/01/2013   CATARACTS      Family History  Problem Relation Age of Onset   Stroke Mother    Stroke Father    Hyperlipidemia Sister    Diabetes Brother    Hypertension Brother    Diabetes Sister    Hypertension Sister    Stroke Maternal Grandfather    Colon cancer  Neg Hx    Stomach cancer Neg Hx    Esophageal cancer Neg Hx    Pancreatic cancer Neg Hx    Colon polyps Neg Hx    Ulcerative colitis Neg Hx     No Known Allergies  Current Medications:   Current Outpatient Medications:    alclomethasone (ACLOVATE) 0.05 % cream, Apply topically daily as needed., Disp: , Rfl:    azelastine (ASTELIN) 0.1 % nasal spray, PLACE 1 SPRAY INTO BOTH NOSTRILS IN THE MORNING. USE IN EACH NOSTRIL AS DIRECTED, Disp: 30 mL, Rfl: 1   furosemide (LASIX) 40 MG tablet, Take 1/2 to 1 tab qd prn edema., Disp: 30 tablet, Rfl: 0   lisinopril (ZESTRIL) 5 MG tablet, Take 1 tablet (5 mg total) by mouth daily., Disp: 90 tablet, Rfl: 0   tiZANidine (ZANAFLEX) 4 MG tablet,  Take 1 tablet (4 mg total) by mouth every 6 (six) hours as needed for muscle spasms., Disp: 30 tablet, Rfl: 0   traZODone (DESYREL) 50 MG tablet, TAKE 1/2 TO 1 TABLET BY MOUTH AT BEDTIME AS NEEDED FOR SLEEP, Disp: 90 tablet, Rfl: 1   triamcinolone cream (KENALOG) 0.1 %, Apply 1 application topically daily as needed., Disp: , Rfl:    TRULICITY 1.5 0000000 SOPN, INJECT 0.5 MLS INTO THE SKIN ONCE A WEEK., Disp: 4 mL, Rfl: 3   Review of Systems:   ROS Negative unless otherwise specified per HPI.  Vitals:   Vitals:   11/10/20 1108  BP: (!) 156/87  Pulse: 61  Temp: 97.9 F (36.6 C)  TempSrc: Temporal  SpO2: 100%  Weight: 151 lb 12.8 oz (68.9 kg)  Height: '5\' 3"'$  (1.6 m)     Body mass index is 26.89 kg/m.  Physical Exam:   Physical Exam Vitals and nursing note reviewed.  Constitutional:      General: She is not in acute distress.    Appearance: She is well-developed. She is not ill-appearing or toxic-appearing.  Cardiovascular:     Rate and Rhythm: Normal rate and regular rhythm.     Pulses: Normal pulses.     Heart sounds: Normal heart sounds, S1 normal and S2 normal.     Comments: No LE edema Pulmonary:     Effort: Pulmonary effort is normal.     Breath sounds: Normal breath sounds.  Musculoskeletal:     Comments: R knee joint line TTP Normal R knee ROM No obvious swelling  Skin:    General: Skin is warm and dry.  Neurological:     Mental Status: She is alert.     GCS: GCS eye subscore is 4. GCS verbal subscore is 5. GCS motor subscore is 6.  Psychiatric:        Speech: Speech normal.        Behavior: Behavior normal. Behavior is cooperative.    Assessment and Plan:   1. Hypertension associated with diabetes ([place]) Uncontrolled Asymptomatic Restart lisinopril 5 mg daily BP log given Has already scheduled follow-up appt with cardiology next month Recommend follow-up with them regarding BP or [place] sooner if concerns Lasix 40 mg prn for swelling  2. Need for  immunization against influenza Completed today  3. Chronic pain of right knee Referral made for evaluation with sports medicine Will defer to them the best next steps -- imaging and/or PT and/or other Patient in agreement  [name], PA-C

## 2020-11-10 NOTE — Patient Instructions (Addendum)
It was great to see you!  Restart blood pressure medication Start lisinopril 5 mg daily  Please check your blood pressure 2-3 times a week and record on sheet for me  We will also double check your kidney function, electrolytes and blood counts today  An appointment has been made for you to see Dr. Georgina Snell. His location: [place] at [facility] [address] on the 1st floor.   Phone number [phone] This location is across the street from the entrance to [facility] and in the same complex as the [facility] and [employer] [facility]  Take care,  [name] PA-C

## 2020-11-13 ENCOUNTER — Other Ambulatory Visit: Payer: Self-pay | Admitting: Physician Assistant

## 2020-11-13 ENCOUNTER — Encounter: Payer: Self-pay | Admitting: Family Medicine

## 2020-11-13 ENCOUNTER — Other Ambulatory Visit: Payer: Self-pay

## 2020-11-13 ENCOUNTER — Ambulatory Visit: Payer: Self-pay

## 2020-11-13 ENCOUNTER — Ambulatory Visit: Payer: Medicare HMO | Admitting: Family Medicine

## 2020-11-13 VITALS — BP 124/68 | HR 75 | Ht 63.0 in | Wt 149.8 lb

## 2020-11-13 DIAGNOSIS — M25561 Pain in right knee: Secondary | ICD-10-CM | POA: Diagnosis not present

## 2020-11-13 DIAGNOSIS — R109 Unspecified abdominal pain: Secondary | ICD-10-CM | POA: Diagnosis not present

## 2020-11-13 DIAGNOSIS — R10A2 Flank pain, left side: Secondary | ICD-10-CM

## 2020-11-13 DIAGNOSIS — M545 Low back pain, unspecified: Secondary | ICD-10-CM

## 2020-11-13 DIAGNOSIS — D638 Anemia in other chronic diseases classified elsewhere: Secondary | ICD-10-CM

## 2020-11-13 NOTE — Patient Instructions (Signed)
Thank you for coming in today.   Call or go to the ER if you develop a large red swollen joint with extreme pain or oozing puss.    I've referred you to Physical Therapy.  Let us know if you don't hear from them in one week.   Recheck in about 6 week. If not better next step is probably MRI.   Meniscus Tear A meniscus tear is a knee injury that happens when a piece of the meniscus is torn. The meniscus is a thick, rubbery, wedge-shaped piece of cartilage in the knee. Each knee has two menisci sitting between the upper bone (femur) and lower bone (tibia) that form the knee joint. Each meniscus acts as a shock absorber for the knee. A torn meniscus is a common knee injury, ranging from mild to severe. Surgery may be needed to repair a severe tear. What are the causes? This condition may be caused by kneeling, squatting, twisting, or pivoting movements. Sports-related injuries are the most common cause, often resulting from: Running and stopping suddenly. Changing direction. Being tackled or knocked off your feet. Lifting or carrying heavy weights. As people get older, their menisci get thinner and weaker. Tears can happen more easily in older people, for example, when climbing stairs. What increases the risk? You are more likely to develop this condition if you: Play contact sports. Have a job that requires kneeling or squatting. Are female. Are over 32 years old. What are the signs or symptoms? Symptoms of this condition include: Knee pain, especially at the side of the knee joint. You may feel pain immediately after injury, or hear a pop and feel pain later. A feeling that your knee is clicking, catching, locking, or giving way (weakness, instability). Not being able to fully bend or extend your knee. Bruising or swelling in your knee. How is this diagnosed? This condition may be diagnosed based on your symptoms and a physical exam. You may also have tests, such  as: X-rays. MRI. Arthroscopy. This is a procedure to look inside your knee with a narrow surgical telescope. You may be referred to a knee specialist (orthopedic surgeon). How is this treated? Treatment for this injury depends on the severity of the tear. Treatment for a mild tear may include: Rest. Medicine to reduce pain and swelling, usually a nonsteroidal anti-inflammatory drug (NSAID), like ibuprofen. A knee brace, sleeve, or wrap. Using crutches or a walker to keep weight off your knee and help with walking. Exercises to strengthen your knee (physical therapy). You may need surgery if you have a severe tear or if other treatments fail. Follow these instructions at home: If you have a brace, sleeve, or wrap: Wear it, as told by your health care provider. Remove it, only as told by your health care provider. Loosen the brace, sleeve, or wrap if your toes tingle, become numb, or turn cold and blue. Keep the brace, sleeve, or wrap clean. If the brace, sleeve, or wrap is not waterproof: Do not let it get wet. Cover it with a watertight covering when you take a bath or shower. Managing pain, stiffness, and swelling  Take over-the-counter and prescription medicines only as told by your health care provider. If directed, put ice on your knee. To do this: If you have a removable brace, sleeve, or wrap, remove it as told by your health care provider. Put ice in a plastic bag. Place a towel between your skin and the bag. Leave the ice on for 20  minutes, 2-3 times per day. Remove the ice if your skin turns bright red. This is very important. If you cannot feel pain, heat, or cold, you have a greater risk of damage to the area. Move your toes often to reduce stiffness and swelling. Raise (elevate) the injured area above the level of your heart while you are sitting or lying down. Activity Do not use the injured limb to support your body weight until your health care provider says that you  can. Use crutches or a walker as told by your health care provider. Return to your normal activities as told by your health care provider. Ask your health care provider what activities are safe for you. Perform range-of-motion exercises only as told by your health care provider. Begin doing exercises to strengthen your knee and leg muscles only as told by your health care provider. After you recover, your health care provider may recommend these exercises to help prevent another injury. General instructions Use a knee brace, sleeve, or wrap as told by your health care provider. Ask your health care provider when it is safe to drive if you have a brace, sleeve, or wrap on your knee. Do not use any products that contain nicotine or tobacco, such as cigarettes, e-cigarettes, and chewing tobacco. If you need help quitting, ask your health care provider. Ask your health care provider if the medicine prescribed to you: Requires you to avoid driving or using heavy machinery. Can cause constipation. You may need to take these actions to prevent or treat constipation: Drink enough fluid to keep your urine pale yellow. Take over-the-counter or prescription medicines. Eat foods that are high in fiber, such as beans, whole grains, and fresh fruits and vegetables. Limit foods that are high in fat and processed sugars, such as fried or sweet foods. Keep all follow-up visits. This is important. Contact a health care provider if: You have a fever. Your knee becomes red, tender, or swollen. Your pain medicine is not controlling your pain. Your symptoms get worse or do not improve after 2 weeks of home care. Summary A meniscus tear is a knee injury that happens when a piece of the meniscus is torn. Treatment for this injury depends on the severity of the tear. You may need surgery if you have a severe tear or if other treatments fail. Rest, ice, and raise (elevate) your injured knee, as told by your health  care provider. This will help lessen pain and swelling. Contact a health care provider if you have new symptoms, your symptoms worsen, or they do not improve after 2 weeks of home care. Keep all follow-up visits. This is important. This information is not intended to replace advice given to you by your health care provider. Make sure you discuss any questions you have with your health care provider. Document Revised: 07/01/2019 Document Reviewed: 07/01/2019 Elsevier Patient Education  [place].

## 2020-11-13 NOTE — Progress Notes (Signed)
I, Wendy Poet, LAT, ATC, am serving as scribe for Dr. Lynne Leader.  Kristie King is a 70 y.o. female who presents to [place] at [facility] today for R knee pain. Pt was previously seen by Dr. Georgina Snell on 06/27/20 L-side flank pain. She was seen at the [facility] on 10/06/20 for her current knee pain and was given a knee brace and prescribed tizanidine. Today, pt reports R knee pain since the beginning of Aug when she slipped going down the last few stairs, hitting her R knee and shin and twisting her R knee. Pt locates pain to her R ant knee around her patella.  She also notes that the left-sided flank pain is a little bit flared up again and she is interested in restarting physical therapy with Antony Haste at [facility].  R knee swelling: No Mechanical symptoms: yes intermittently Aggravates: at the end of the day; transitioning from sitting-to-standing; twisting/rotation w/ her R foot planted Treatments tried: IBU; Blue Emu; knee brace; Tiger balm; Tizanidine  Dx imaging: 10/06/20 R knee XR  Pertinent review of systems: No fevers or chills  Relevant historical information: HTN and DM sarcoidosis   Exam:  BP 124/68 (BP Location: Left Arm, Patient Position: Sitting, Cuff Size: Normal)   Pulse 75   Ht '5\' 3"'$  (1.6 m)   Wt 149 lb 12.8 oz (67.9 kg)   SpO2 98%   BMI 26.54 kg/m  General: Well Developed, well nourished, and in no acute distress.   MSK: Right knee normal-appearing Normal motion with crepitation.  Tender palpation anterior to medial knee. Nontender at proximal fibular head. Stable ligamentous exam. Mildly positive medial McMurray's test. Intact strength.  Left flank normal appearing.  Normal motion some pain with rotation.    Lab and Radiology Results  Procedure: Real-time Ultrasound Guided Injection of left knee superior lateral patellar space Device: Philips Affiniti 50G Images permanently stored and available for review in PACS Ultrasound  evaluation prior to injection reveals degenerative appearing medial meniscus with partial extrusion. Verbal informed consent obtained.  Discussed risks and benefits of procedure. Warned about infection bleeding damage to structures skin hypopigmentation and fat atrophy among others. Patient expresses understanding and agreement Time-out conducted.   Noted no overlying erythema, induration, or other signs of local infection.   Skin prepped in a sterile fashion.   Local anesthesia: Topical Ethyl chloride.   With sterile technique and under real time ultrasound guidance: 40 mg of Kenalog and 2 mL of Marcaine injected into knee joint. Fluid seen entering the Joint capsule Completed without difficulty   Pain immediately resolved suggesting accurate placement of the medication.   Advised to call if fevers/chills, erythema, induration, drainage, or persistent bleeding.   Images permanently stored and available for review in the ultrasound unit.  Impression: Technically successful ultrasound guided injection.     EXAM: RIGHT KNEE - 1-2 VIEW   COMPARISON:  None   FINDINGS: Mild irregularity of the fibular head is suggested on AP view. No oblique view with AP and lateral views only. No sign of soft tissue swelling.   Degenerative changes, mild in the knee.   No joint effusion. Enthesopathy upon the patella.   IMPRESSION: Mild irregularity of the fibular head is suggested on AP view, correlate with any point tenderness in the region of the fibular head. May represent sequela of remote injury or subtle fracture though there is no overlying soft tissue swelling.     Electronically Signed   By:  Zetta Bills M.D.   On: 10/06/2020 12:54   I, Lynne Leader, personally (independently) visualized and performed the interpretation of the images attached in this note.     Assessment and Plan: 70 y.o. female with right knee pain.  Etiology somewhat unclear but thought to be possibly medial  meniscus injury.  Patient does have mechanical symptoms.  Discussed options.  Plan for trial of steroid injection and quad strengthening exercises with physical therapy.  If not improving will proceed to MRI.  Additional refer to physical therapy for the flank pain that has now become recurrent.  Check back in about 6 weeks.   PDMP not reviewed this encounter. Orders Placed This Encounter  Procedures   [place] LIMITED JOINT SPACE STRUCTURES LOW RIGHT(NO LINKED CHARGES)    Order Specific Question:   Reason for Exam (SYMPTOM  OR DIAGNOSIS REQUIRED)    Answer:   R knee pain    Order Specific Question:   Preferred imaging location?    Answer:   [place]   Ambulatory referral to Physical Therapy    Referral Priority:   Routine    Referral Type:   Physical Medicine    Referral Reason:   Specialty Services Required    Requested Specialty:   Physical Therapy    Number of Visits Requested:   1   No orders of the defined types were placed in this encounter.    Discussed warning signs or symptoms. Please see discharge instructions. Patient expresses understanding.   The above documentation has been reviewed and is accurate and complete Lynne Leader, M.D.

## 2020-11-20 ENCOUNTER — Other Ambulatory Visit: Payer: Self-pay | Admitting: *Deleted

## 2020-11-20 DIAGNOSIS — D638 Anemia in other chronic diseases classified elsewhere: Secondary | ICD-10-CM

## 2020-11-20 DIAGNOSIS — R710 Precipitous drop in hematocrit: Secondary | ICD-10-CM

## 2020-11-22 ENCOUNTER — Other Ambulatory Visit: Payer: Self-pay | Admitting: Pain Medicine

## 2020-11-22 ENCOUNTER — Ambulatory Visit: Payer: Medicare HMO | Admitting: Family Medicine

## 2020-11-22 DIAGNOSIS — G8929 Other chronic pain: Secondary | ICD-10-CM

## 2020-11-22 DIAGNOSIS — M25552 Pain in left hip: Secondary | ICD-10-CM

## 2020-11-23 ENCOUNTER — Ambulatory Visit: Payer: Medicare HMO | Admitting: Family Medicine

## 2020-11-24 ENCOUNTER — Other Ambulatory Visit (INDEPENDENT_AMBULATORY_CARE_PROVIDER_SITE_OTHER): Payer: Medicare HMO

## 2020-11-24 ENCOUNTER — Other Ambulatory Visit: Payer: Self-pay

## 2020-11-24 DIAGNOSIS — R710 Precipitous drop in hematocrit: Secondary | ICD-10-CM

## 2020-11-24 DIAGNOSIS — D638 Anemia in other chronic diseases classified elsewhere: Secondary | ICD-10-CM

## 2020-11-24 LAB — CBC WITH DIFFERENTIAL/PLATELET
Basophils Absolute: 0 10*3/uL (ref 0.0–0.1)
Basophils Relative: 0.4 % (ref 0.0–3.0)
Eosinophils Absolute: 0.1 10*3/uL (ref 0.0–0.7)
Eosinophils Relative: 1.5 % (ref 0.0–5.0)
HCT: 33.1 % — ABNORMAL LOW (ref 36.0–46.0)
Hemoglobin: 10.8 g/dL — ABNORMAL LOW (ref 12.0–15.0)
Lymphocytes Relative: 35.1 % (ref 12.0–46.0)
Lymphs Abs: 2 10*3/uL (ref 0.7–4.0)
MCHC: 32.6 g/dL (ref 30.0–36.0)
MCV: 88.4 fl (ref 78.0–100.0)
Monocytes Absolute: 0.6 10*3/uL (ref 0.1–1.0)
Monocytes Relative: 10.8 % (ref 3.0–12.0)
Neutro Abs: 2.9 10*3/uL (ref 1.4–7.7)
Neutrophils Relative %: 52.2 % (ref 43.0–77.0)
Platelets: 249 10*3/uL (ref 150.0–400.0)
RBC: 3.74 Mil/uL — ABNORMAL LOW (ref 3.87–5.11)
RDW: 13.5 % (ref 11.5–15.5)
WBC: 5.6 10*3/uL (ref 4.0–10.5)

## 2020-12-06 ENCOUNTER — Ambulatory Visit: Payer: Medicare HMO | Admitting: Podiatry

## 2020-12-11 ENCOUNTER — Encounter: Payer: Self-pay | Admitting: Podiatry

## 2020-12-11 ENCOUNTER — Ambulatory Visit (INDEPENDENT_AMBULATORY_CARE_PROVIDER_SITE_OTHER): Payer: Self-pay | Admitting: Podiatry

## 2020-12-11 ENCOUNTER — Other Ambulatory Visit: Payer: Self-pay

## 2020-12-11 DIAGNOSIS — B351 Tinea unguium: Secondary | ICD-10-CM

## 2020-12-11 MED ORDER — TERBINAFINE HCL 250 MG PO TABS: 250.0000 mg | ORAL_TABLET | Freq: Every day | ORAL | 0 refills | Status: DC

## 2020-12-11 NOTE — Progress Notes (Signed)
  Subjective:  Patient ID: Kristie King, female    DOB: 1951/02/23,   MRN: [id number]  Chief Complaint  Patient presents with   Nail Problem    I stumped my left big toe and the nail is a dark color and there is some discoloration on the right big toe as well    70 y.o. female presents for bilateral discoloration of hallux nails particularly the left hallux nail. Relates she may have dropped something on her right toe about 2-3 months ago. Has been using tea tree oil on the nails.Denies any pain. Patient is diabetic.  Marland Kitchen Denies any other pedal complaints. Denies n/v/f/c.   Past Medical History:  Diagnosis Date   AC (acromioclavicular) arthritis 11/13/2016   Allergy    SEASONAL   Cataract    BILATERAL-REMOVED   Cervical radiculopathy at C6 09/10/2016   CKD stage 2 due to type 2 diabetes mellitus ([place]) 04/14/2018   Diabetes mellitus without complication (HCC)    History of lumbar laminectomy 2019   HTN (hypertension) 09/17/2012   Hyperlipidemia    Hyperlipidemia associated with type 2 diabetes mellitus ([place]) 09/17/2012   Hypertension associated with diabetes ([place]) 04/14/2018   Iron deficiency anemia 10/16/2016   Osteoarthritis of spine with radiculopathy, cervical region 08/03/2016   Vitamin D deficiency 10/16/2016    Objective:  Physical Exam: Vascular: DP/PT pulses 2/4 bilateral. CFT <3 seconds. Normal hair growth on digits. No edema.  Skin. No lacerations or abrasions bilateral feet. Discolored thickened bilateral great toenails.  Musculoskeletal: MMT 5/5 bilateral lower extremities in DF, PF, Inversion and Eversion. Deceased ROM in DF of ankle joint.  Neurological: Sensation intact to light touch.   Assessment:   1. Onychomycosis due to dermatophyte      Plan:  Patient was evaluated and treated and all questions answered. -Examined patient -Discussed treatment options for painful dystrophic nails  -Discussed fungal nail treatment options including oral, topical, and laser  treatments.  -LFTS reviewed and within normal limits. -Prescription for lamisil.  -Patient to return in 3 months for follow up evaluation and discussion of fungal culture results or sooner if symptoms worsen.   Lorenda Peck, DPM

## 2020-12-12 ENCOUNTER — Other Ambulatory Visit: Payer: Self-pay | Admitting: Podiatry

## 2020-12-12 ENCOUNTER — Telehealth: Payer: Self-pay | Admitting: Podiatry

## 2020-12-12 MED ORDER — TERBINAFINE HCL 250 MG PO TABS: 250.0000 mg | ORAL_TABLET | Freq: Every day | ORAL | 0 refills | Status: DC

## 2020-12-12 NOTE — Telephone Encounter (Signed)
Pt called office to notify us that [facility] states they have not received prescription for lamasil. Could you re send? Thanks

## 2020-12-22 ENCOUNTER — Ambulatory Visit (HOSPITAL_BASED_OUTPATIENT_CLINIC_OR_DEPARTMENT_OTHER): Payer: Medicare HMO | Admitting: Internal Medicine

## 2020-12-25 ENCOUNTER — Ambulatory Visit: Payer: Medicare HMO | Admitting: Family Medicine

## 2020-12-25 NOTE — Progress Notes (Deleted)
   I, Peterson Lombard, LAT, ATC acting as a scribe for Lynne Leader, MD.  Kristie King is a 70 y.o. female who presents to [place] at [facility] today for f/u R knee pain. Pt was last seen by Dr. Georgina Snell on 11/13/20 and was given a R knee steroid injection and was referred to PT to work on quad strengthening, but has not completed any visits. Today, pt reports  Dx imaging: 10/06/20 R knee XR  Pertinent review of systems: ***  Relevant historical information: ***   Exam:  There were no vitals taken for this visit. General: Well Developed, well nourished, and in no acute distress.   MSK: ***    Lab and Radiology Results No results found for this or any previous visit (from the past 72 hour(s)). No results found.     Assessment and Plan: 70 y.o. female with ***   PDMP not reviewed this encounter. No orders of the defined types were placed in this encounter.  No orders of the defined types were placed in this encounter.    Discussed warning signs or symptoms. Please see discharge instructions. Patient expresses understanding.   ***

## 2020-12-27 ENCOUNTER — Other Ambulatory Visit: Payer: Self-pay

## 2020-12-27 ENCOUNTER — Ambulatory Visit
Admission: RE | Admit: 2020-12-27 | Discharge: 2020-12-27 | Disposition: A | Discharge status: Home / Self Care | Payer: Medicare HMO | Source: Ambulatory Visit | Attending: Pain Medicine | Admitting: Pain Medicine

## 2020-12-27 DIAGNOSIS — M545 Low back pain, unspecified: Secondary | ICD-10-CM | POA: Diagnosis not present

## 2020-12-27 DIAGNOSIS — M25552 Pain in left hip: Secondary | ICD-10-CM

## 2020-12-27 DIAGNOSIS — M48061 Spinal stenosis, lumbar region without neurogenic claudication: Secondary | ICD-10-CM | POA: Diagnosis not present

## 2021-01-01 ENCOUNTER — Encounter: Payer: Medicare HMO | Admitting: Physician Assistant

## 2021-02-01 ENCOUNTER — Other Ambulatory Visit: Payer: Self-pay | Admitting: Physician Assistant

## 2021-02-09 NOTE — Progress Notes (Signed)
Kristie King is a 70 y.o. female here for a follow up referred by former PCP, Dr. Rogers Blocker.   History of Present Illness:   Chief Complaint  Patient presents with   Transfer of care   Hypertension   throat problem    Pt has been having some trouble swallowing x 3 weeks.   Diabetes    HPI  Throat irritation [name] states she has been having an irritation in her throat for three weeks. Pt descrived this as having something caught in her throat, "feels like a piece of lint." In an effort to manage sx, she has been gargling a salt and water mixture, which has provided relief.  She has not noticed the sensation becoming more frequent but would still like a referral to gastroenterology. Denies pain, trouble breathing, unintentional weight loss, nausea, or vomiting.   Diabetes Current DM meds: trulicity 1.5 mg/injection once a week. Blood sugars at home are: not regularly checked.  Patient is compliant with medications. Denies: hypoglycemic or hyperglycemic episodes or symptoms. This patient's diabetes is complicated by HTN. Despite trulicity becoming more expensive as the year continues, she is interested in continuing this medication.  Lab Results  Component Value Date   HGBA1C 6.2 05/18/2020    HTN Currently reports taking lisinopril 5 mg prn 5 days a week and lasix 40 mg prn about 3 times a week. At home blood pressure readings are:  checked regularly, with fluctuations, per patient. Patient denies chest pain, SOB, blurred vision, dizziness, unusual headaches. Denies excessive caffeine intake, stimulant usage, excessive alcohol intake, or increase in salt consumption.  BP Readings from Last 3 Encounters:  02/12/21 (!) 156/94  11/13/20 124/68  11/10/20 (!) 156/87     Past Medical History:  Diagnosis Date   AC (acromioclavicular) arthritis 11/13/2016   Allergy    SEASONAL   Cataract    BILATERAL-REMOVED   Cervical radiculopathy at C6 09/10/2016   CKD stage 2 due to type 2 diabetes  mellitus ([place]) 04/14/2018   Diabetes mellitus without complication (HCC)    History of lumbar laminectomy 2019   HTN (hypertension) 09/17/2012   Hyperlipidemia    Hyperlipidemia associated with type 2 diabetes mellitus ([place]) 09/17/2012   Hypertension associated with diabetes ([place]) 04/14/2018   Iron deficiency anemia 10/16/2016   Osteoarthritis of spine with radiculopathy, cervical region 08/03/2016   Vitamin D deficiency 10/16/2016     Social History   Tobacco Use   Smoking status: Former    Years: 10.00    Types: Cigarettes    Quit date: 01/15/1971    Years since quitting: 50.1   Smokeless tobacco: Never  Vaping Use   Vaping Use: Never used  Substance Use Topics   Alcohol use: Yes    Comment: 09/17/2012 "drink on special occasions only"   Drug use: No    Past Surgical History:  Procedure Laterality Date   BACK SURGERY  03/30/2018   BUNIONECTOMY Right 02/18/2013   BUNIONECTOMY Left 04/01/2013   CATARACTS      Family History  Problem Relation Age of Onset   Stroke Mother    Stroke Father    Hyperlipidemia Sister    Diabetes Brother    Hypertension Brother    Diabetes Sister    Hypertension Sister    Stroke Maternal Grandfather    Colon cancer Neg Hx    Stomach cancer Neg Hx    Esophageal cancer Neg Hx    Pancreatic cancer Neg Hx    Colon polyps  Neg Hx    Ulcerative colitis Neg Hx     No Known Allergies  Current Medications:   Current Outpatient Medications:    lisinopril (ZESTRIL) 5 MG tablet, TAKE 1 TABLET (5 MG TOTAL) BY MOUTH DAILY., Disp: 90 tablet, Rfl: 0   traZODone (DESYREL) 50 MG tablet, TAKE 1/2 TO 1 TABLET BY MOUTH AT BEDTIME AS NEEDED FOR SLEEP, Disp: 90 tablet, Rfl: 1   TRULICITY 1.5 GQ/6.7YP SOPN, INJECT 0.5 MLS INTO THE SKIN ONCE A WEEK., Disp: 4 mL, Rfl: 3   furosemide (LASIX) 40 MG tablet, Take 1/2 to 1 tab qd prn edema. (Patient not taking: Reported on 02/12/2021), Disp: 30 tablet, Rfl: 0   Review of Systems:   ROS Negative unless otherwise  specified per HPI.  Vitals:   Vitals:   02/12/21 1005  BP: (!) 156/94  Pulse: 62  Temp: (!) 97.3 F (36.3 C)  TempSrc: Temporal  SpO2: 98%  Weight: 149 lb (67.6 kg)  Height: 5\' 3"  (1.6 m)     Body mass index is 26.39 kg/m.  Physical Exam:   Physical Exam Vitals and nursing note reviewed.  Constitutional:      General: She is not in acute distress.    Appearance: She is well-developed. She is not ill-appearing or toxic-appearing.  HENT:     Head: Normocephalic and atraumatic.     Mouth/Throat:     Lips: Pink.     Mouth: Mucous membranes are moist.     Pharynx: Oropharynx is clear. Uvula midline. No posterior oropharyngeal erythema.  Eyes:     General: Lids are normal.     Conjunctiva/sclera: Conjunctivae normal.  Neck:     Trachea: Trachea normal.  Cardiovascular:     Rate and Rhythm: Normal rate and regular rhythm.     Pulses: Normal pulses.     Heart sounds: Normal heart sounds, S1 normal and S2 normal.  Pulmonary:     Effort: Pulmonary effort is normal.     Breath sounds: Normal breath sounds. No decreased breath sounds, wheezing, rhonchi or rales.  Lymphadenopathy:     Cervical: No cervical adenopathy.  Skin:    General: Skin is warm and dry.  Neurological:     Mental Status: She is alert.     GCS: GCS eye subscore is 4. GCS verbal subscore is 5. GCS motor subscore is 6.  Psychiatric:        Speech: Speech normal.        Behavior: Behavior normal. Behavior is cooperative.    Assessment and Plan:   Hypertension associated with diabetes ([place]) Uncontrolled Start lisinopril 5 mg daily  Monitor BP at home 1-2 times a week and keep a log I advised patient that if BP reads consistently >140/90, to reach out to office and we can advise further Follow up in 3 months, sooner if concerns occur   Generalized edema Controlled Maintain lasix 20-40 mg daily as needed for LE edema Update BMP today and provide recommendations accordingly on need for possible  potassium supplementation, if indicated on lab results Recommended working on regular hydration  Throat irritation Will send referral to gastroenterology for further evaluation per patient request Continue to chew foods adequately to prevent choking episodes Follow up as needed   Type 2 diabetes mellitus with other specified complication, without long-term current use of insulin ([place]) Update labs today, will adjust medication as indicated by labs I did discuss the option of restarting Metformin daily and discontinuing Trulicity 1.5 mg weekly  injection due to cost of Trulicity. Patient will consider this and follow up as desired.  Did discuss that she is very tightly controlled, based on last A1c, and we do also have the potential to de-prescribe her medications. Will provide recommendations with blood work results. Follow up as based on lab results.  I,Havlyn C Ratchford,acting as a Education administrator for [employer], PA.,have documented all relevant documentation on the behalf of [name], PA,as directed by  [name], PA while in the presence of [name], [place].  I, [name], [place], have reviewed all documentation for this visit. The documentation on 02/12/21 for the exam, diagnosis, procedures, and orders are all accurate and complete.   [name], PA-C

## 2021-02-12 ENCOUNTER — Ambulatory Visit (INDEPENDENT_AMBULATORY_CARE_PROVIDER_SITE_OTHER): Payer: Medicare HMO | Admitting: Physician Assistant

## 2021-02-12 ENCOUNTER — Other Ambulatory Visit: Payer: Self-pay

## 2021-02-12 ENCOUNTER — Encounter: Payer: Self-pay | Admitting: Physician Assistant

## 2021-02-12 VITALS — BP 156/94 | HR 62 | Temp 97.3°F | Ht 63.0 in | Wt 149.0 lb

## 2021-02-12 DIAGNOSIS — E1159 Type 2 diabetes mellitus with other circulatory complications: Secondary | ICD-10-CM | POA: Diagnosis not present

## 2021-02-12 DIAGNOSIS — R601 Generalized edema: Secondary | ICD-10-CM | POA: Diagnosis not present

## 2021-02-12 DIAGNOSIS — J392 Other diseases of pharynx: Secondary | ICD-10-CM

## 2021-02-12 DIAGNOSIS — I152 Hypertension secondary to endocrine disorders: Secondary | ICD-10-CM | POA: Diagnosis not present

## 2021-02-12 DIAGNOSIS — E1169 Type 2 diabetes mellitus with other specified complication: Secondary | ICD-10-CM | POA: Diagnosis not present

## 2021-02-12 LAB — BASIC METABOLIC PANEL
BUN: 15 mg/dL (ref 6–23)
CO2: 28 mEq/L (ref 19–32)
Calcium: 9.4 mg/dL (ref 8.4–10.5)
Chloride: 105 mEq/L (ref 96–112)
Creatinine, Ser: 0.9 mg/dL (ref 0.40–1.20)
GFR: 64.97 mL/min (ref >60.00)
Glucose, Bld: 86 mg/dL (ref 70–99)
Potassium: 3.9 mEq/L (ref 3.5–5.1)
Sodium: 141 mEq/L (ref 135–145)

## 2021-02-12 LAB — HEMOGLOBIN A1C: Hgb A1c MFr Bld: 6.1 % (ref 4.6–6.5)

## 2021-02-12 MED ORDER — LISINOPRIL 5 MG PO TABS: 5.0000 mg | ORAL_TABLET | Freq: Every day | ORAL | 3 refills | Status: DC

## 2021-02-12 MED ORDER — FUROSEMIDE 40 MG PO TABS: ORAL_TABLET | ORAL | 0 refills | Status: DC

## 2021-02-12 NOTE — Patient Instructions (Addendum)
It was great to see you!  Start taking lisinopril 5 mg daily. Please record your blood pressure for [place] regularly -- take 1-2 days per week. May use lasix as needed for swelling.  Let's follow-up in 3 MONTHS for your blood pressure.  We will update your HgbA1c today and provide recommendations via MyChart  I will put in referral to gastroenterology today.  If a referral was placed today, you will be contacted for an appointment. Please note that routine referrals can sometimes take up to 3-4 weeks to process. Please call our office if you haven't heard anything after this time frame.  Take care,  [name] PA-C

## 2021-02-12 NOTE — Addendum Note (Signed)
Addended by: Erlene Quan on: 02/12/2021 11:35 AM   Modules accepted: Orders

## 2021-02-19 ENCOUNTER — Telehealth: Payer: Self-pay | Admitting: *Deleted

## 2021-02-19 NOTE — Telephone Encounter (Signed)
Pt called but was disconnected. Called back and left message on her voicemail to call me.

## 2021-02-19 NOTE — Telephone Encounter (Signed)
Pt called back she said she will continue on her dosage of Trulicity and not change at this time. Told her that is fine. Pt asked if Aldona Bar could write her a prescription for cough syrup? Told her Aldona Bar is not here and she usually just recommends Delsym OTC for coughs. Pt verbalized understanding.

## 2021-02-20 DIAGNOSIS — Z1231 Encounter for screening mammogram for malignant neoplasm of breast: Secondary | ICD-10-CM | POA: Diagnosis not present

## 2021-02-20 LAB — HM MAMMOGRAPHY

## 2021-02-27 ENCOUNTER — Encounter: Payer: Self-pay | Admitting: Physician Assistant

## 2021-02-28 DIAGNOSIS — R0781 Pleurodynia: Secondary | ICD-10-CM | POA: Diagnosis not present

## 2021-02-28 DIAGNOSIS — M4727 Other spondylosis with radiculopathy, lumbosacral region: Secondary | ICD-10-CM | POA: Diagnosis not present

## 2021-02-28 DIAGNOSIS — G894 Chronic pain syndrome: Secondary | ICD-10-CM | POA: Diagnosis not present

## 2021-02-28 DIAGNOSIS — M5136 Other intervertebral disc degeneration, lumbar region: Secondary | ICD-10-CM | POA: Diagnosis not present

## 2021-03-07 ENCOUNTER — Ambulatory Visit: Payer: Medicare HMO | Admitting: Podiatry

## 2021-03-21 ENCOUNTER — Ambulatory Visit
Admission: RE | Admit: 2021-03-21 | Discharge: 2021-03-21 | Disposition: A | Discharge status: Home / Self Care | Payer: Medicare HMO | Source: Ambulatory Visit | Attending: Pain Medicine | Admitting: Pain Medicine

## 2021-03-21 ENCOUNTER — Other Ambulatory Visit: Payer: Self-pay | Admitting: Pain Medicine

## 2021-03-21 ENCOUNTER — Other Ambulatory Visit: Payer: Self-pay

## 2021-03-21 DIAGNOSIS — M898X8 Other specified disorders of bone, other site: Secondary | ICD-10-CM

## 2021-03-21 DIAGNOSIS — R0781 Pleurodynia: Secondary | ICD-10-CM

## 2021-03-21 DIAGNOSIS — M47816 Spondylosis without myelopathy or radiculopathy, lumbar region: Secondary | ICD-10-CM | POA: Diagnosis not present

## 2021-03-21 DIAGNOSIS — R102 Pelvic and perineal pain: Secondary | ICD-10-CM | POA: Diagnosis not present

## 2021-03-21 DIAGNOSIS — M545 Low back pain, unspecified: Secondary | ICD-10-CM | POA: Diagnosis not present

## 2021-04-06 ENCOUNTER — Emergency Department (HOSPITAL_BASED_OUTPATIENT_CLINIC_OR_DEPARTMENT_OTHER)
Admission: EM | Admit: 2021-04-06 | Discharge: 2021-04-06 | Disposition: A | Discharge status: Home / Self Care | Payer: Medicare HMO | Attending: Emergency Medicine | Admitting: Emergency Medicine

## 2021-04-06 ENCOUNTER — Ambulatory Visit: Payer: Medicare HMO | Admitting: Gastroenterology

## 2021-04-06 ENCOUNTER — Telehealth: Payer: Self-pay | Admitting: Physician Assistant

## 2021-04-06 ENCOUNTER — Other Ambulatory Visit: Payer: Self-pay

## 2021-04-06 ENCOUNTER — Encounter (HOSPITAL_BASED_OUTPATIENT_CLINIC_OR_DEPARTMENT_OTHER): Payer: Self-pay | Admitting: *Deleted

## 2021-04-06 DIAGNOSIS — R42 Dizziness and giddiness: Secondary | ICD-10-CM | POA: Diagnosis not present

## 2021-04-06 DIAGNOSIS — I10 Essential (primary) hypertension: Secondary | ICD-10-CM | POA: Diagnosis not present

## 2021-04-06 DIAGNOSIS — R059 Cough, unspecified: Secondary | ICD-10-CM | POA: Diagnosis present

## 2021-04-06 DIAGNOSIS — Z20822 Contact with and (suspected) exposure to covid-19: Secondary | ICD-10-CM | POA: Insufficient documentation

## 2021-04-06 DIAGNOSIS — Z79899 Other long term (current) drug therapy: Secondary | ICD-10-CM | POA: Diagnosis not present

## 2021-04-06 LAB — CBC WITH DIFFERENTIAL/PLATELET
Abs Immature Granulocytes: 0.01 10*3/uL (ref 0.00–0.07)
Basophils Absolute: 0 10*3/uL (ref 0.0–0.1)
Basophils Relative: 1 %
Eosinophils Absolute: 0.1 10*3/uL (ref 0.0–0.5)
Eosinophils Relative: 2 %
HCT: 36.3 % (ref 36.0–46.0)
Hemoglobin: 11.9 g/dL — ABNORMAL LOW (ref 12.0–15.0)
Immature Granulocytes: 0 %
Lymphocytes Relative: 42 %
Lymphs Abs: 2.1 10*3/uL (ref 0.7–4.0)
MCH: 29.1 pg (ref 26.0–34.0)
MCHC: 32.8 g/dL (ref 30.0–36.0)
MCV: 88.8 fL (ref 80.0–100.0)
Monocytes Absolute: 0.6 10*3/uL (ref 0.1–1.0)
Monocytes Relative: 12 %
Neutro Abs: 2.1 10*3/uL (ref 1.7–7.7)
Neutrophils Relative %: 43 %
Platelets: 271 10*3/uL (ref 150–400)
RBC: 4.09 MIL/uL (ref 3.87–5.11)
RDW: 13.1 % (ref 11.5–15.5)
WBC: 4.9 10*3/uL (ref 4.0–10.5)
nRBC: 0 % (ref 0.0–0.2)

## 2021-04-06 LAB — BASIC METABOLIC PANEL
Anion gap: 8 (ref 5–15)
BUN: 27 mg/dL — ABNORMAL HIGH (ref 8–23)
CO2: 32 mmol/L (ref 22–32)
Calcium: 9.5 mg/dL (ref 8.9–10.3)
Chloride: 98 mmol/L (ref 98–111)
Creatinine, Ser: 1.06 mg/dL — ABNORMAL HIGH (ref 0.44–1.00)
GFR, Estimated: 57 mL/min — ABNORMAL LOW (ref >60)
Glucose, Bld: 82 mg/dL (ref 70–99)
Potassium: 4.1 mmol/L (ref 3.5–5.1)
Sodium: 138 mmol/L (ref 135–145)

## 2021-04-06 LAB — RESP PANEL BY RT-PCR (FLU A&B, COVID) ARPGX2
Influenza A by PCR: NEGATIVE
Influenza B by PCR: NEGATIVE
SARS Coronavirus 2 by RT PCR: NEGATIVE

## 2021-04-06 MED ORDER — MECLIZINE HCL 25 MG PO TABS: 25.0000 mg | ORAL_TABLET | Freq: Two times a day (BID) | ORAL | 0 refills | Status: DC | PRN

## 2021-04-06 NOTE — Discharge Instructions (Addendum)
You have been seen and discharged from the emergency department.  Your blood work showed mild dehydration.  You may be suffering from vertigo. Follow-up with your primary provider and ENT for further evaluation and further care. You may take meclizine as needed for vertiginous symptoms. Stay well hydrated. Take home medications as prescribed. If you have any worsening symptoms or further concerns for your health please return to an emergency department for further evaluation.

## 2021-04-06 NOTE — ED Provider Notes (Signed)
[facility] EMERGENCY DEPARTMENT Provider Note   CSN: [id number] Arrival date & time: 04/06/21  1451     History  Chief Complaint  Patient presents with   Cough   Dizziness    Kristie King is a 71 y.o. female.  HPI  71 year old female with past medical history of HTN presents the emergency department with concern for intermittent episodes of lightheadedness.  Patient states this has been going on for about the past week. She states it is mainly with brief head movement or bending over.  She feels slightly lightheaded but then this self resolves.  She has no associated headache, dizziness, chest pain, shortness of breath.  She is in her usual state of health, continues to workout at the gym without any difficulty.  No recent fever or illness.  No associated neuro symptoms.  Appetite has been baseline.  Patient did start a new medication lisinopril recently.  Home Medications Prior to Admission medications   Medication Sig Start Date End Date Taking? Authorizing Provider  furosemide (LASIX) 40 MG tablet Take 1/2 to 1 tab qd prn edema. 02/12/21   [name], PA  lisinopril (ZESTRIL) 5 MG tablet Take 1 tablet (5 mg total) by mouth daily. 02/12/21   [name], PA  traZODone (DESYREL) 50 MG tablet TAKE 1/2 TO 1 TABLET BY MOUTH AT BEDTIME AS NEEDED FOR SLEEP 01/10/20   Orma Flaming, MD  TRULICITY 1.5 LS/9.3TD SOPN INJECT 0.5 MLS INTO THE SKIN ONCE A WEEK. 07/14/20   Orma Flaming, MD      Allergies    Patient has no known allergies.    Review of Systems   Review of Systems  Constitutional:  Negative for fever.  Respiratory:  Negative for shortness of breath.   Cardiovascular:  Negative for chest pain.  Gastrointestinal:  Negative for abdominal pain, diarrhea and vomiting.  Musculoskeletal:  Negative for back pain and neck pain.  Skin:  Negative for rash.  Neurological:  Positive for light-headedness. Negative for dizziness, syncope, facial asymmetry, speech  difficulty, weakness, numbness and headaches.   Physical Exam Updated Vital Signs BP 120/70    Pulse 72    Temp 97.9 F (36.6 C) (Oral)    Resp 18    Ht 5\' 3"  (1.6 m)    Wt 67.6 kg    SpO2 100%    BMI 26.40 kg/m  Physical Exam Vitals and nursing note reviewed.  Constitutional:      Appearance: Normal appearance. She is not ill-appearing or diaphoretic.  HENT:     Head: Normocephalic.     Mouth/Throat:     Mouth: Mucous membranes are moist.  Eyes:     Extraocular Movements: Extraocular movements intact.     Pupils: Pupils are equal, round, and reactive to light.  Cardiovascular:     Rate and Rhythm: Normal rate.  Pulmonary:     Effort: Pulmonary effort is normal. No respiratory distress.  Abdominal:     Palpations: Abdomen is soft.     Tenderness: There is no abdominal tenderness.  Musculoskeletal:     Cervical back: No rigidity or tenderness.  Skin:    General: Skin is warm.  Neurological:     General: No focal deficit present.     Mental Status: She is alert and oriented to person, place, and time. Mental status is at baseline.     Cranial Nerves: No cranial nerve deficit.  Psychiatric:        Mood and Affect: Mood normal.  ED Results / Procedures / Treatments   Labs (all labs ordered are listed, but only abnormal results are displayed) Labs Reviewed  RESP PANEL BY RT-PCR (FLU A&B, COVID) [id number]  CBC WITH DIFFERENTIAL/PLATELET  BASIC METABOLIC PANEL    EKG None  Radiology No results found.  Procedures Procedures    Medications Ordered in ED Medications - No data to display  ED Course/ Medical Decision Making/ A&P                           Medical Decision Making Amount and/or Complexity of Data Reviewed Labs: ordered.   71 year old female presents emergency department with brief episodes of lightheadedness, usually related to head movement or position change.  Patient was recently started on lisinopril.  Denies any dizziness, difficulty with gait,  neuro symptoms.  No recent fever or illness.  No history of vertigo in the past.  No active headache.  Patient is very well-appearing on physical exam.  Neuro intact.  Orthostatic vitals are normal.  Blood work reveals mild dehydration but no other acute findings.  Do not appreciate this to be acutely neurologic or TIA/CVA.  Very low suspicion that this lightheadedness is cardiac or ACS.  Sounds vertiginous in my opinion given the relationship with position changes.  Could be more prominent given the recent start of lisinopril and mild dehydration.  Will refer the patient for outpatient follow-up, encouraged oral rehydration.  She is otherwise very well-appearing and plan for outpatient follow-up.  Patient at this time appears safe and stable for discharge and close outpatient follow up. Discharge plan and strict return to ED precautions discussed, patient verbalizes understanding and agreement.        Final Clinical Impression(s) / ED Diagnoses Final diagnoses:  None    Rx / DC Orders ED Discharge Orders     None         Lorelle Gibbs, DO 04/06/21 1823

## 2021-04-06 NOTE — ED Triage Notes (Signed)
For a few days she has been light headed. She started taking Lisinopril for 3 months.

## 2021-04-06 NOTE — Telephone Encounter (Signed)
See [place] note.

## 2021-04-06 NOTE — Telephone Encounter (Signed)
Pt was advised to go to ED   Patient Name: Kristie King Gender: Female DOB: 1950-11-13 Age: 71 Y 2 M 21 D Return Phone Number: [phone] (Primary) Address: City/ State/ Zip: [place]  [address] Client [facility] Client Site [facility] Provider Morene Rankins, [place]- PA Contact Type Call Who Is Calling Patient / Member / Family / Caregiver Call Type Triage / Clinical Relationship To Patient Self Return Phone Number [phone] (Primary) Chief Complaint Blood Pressure Low Reason for Call Symptomatic / Request for [place] states that he is transferring a patient who is having blood pressure issues. Her BP was 94/61 and 72, Most recently 102/63 and 69. She is experiencing headaches and a spinning sensation when turning her head. Translation No Nurse Assessment Nurse: Clovis Riley, RN, Georgina Peer Date/Time (Eastern Time): 04/06/2021 11:53:52 AM Confirm and document reason for call. If symptomatic, describe symptoms. ---Caller states she is having blood pressure issues. Most recently BP102/63 and 69 this morning. She is experiencing headaches and a spinning sensation when turning her head. Does the patient have any new or worsening symptoms? ---Yes Will a triage be completed? ---Yes Related visit to physician within the last 2 weeks? ---No Does the PT have any chronic conditions? (i.e. diabetes, asthma, this includes High risk factors for pregnancy, etc.) ---Yes List chronic conditions. ---HTN Is this a behavioral health or substance abuse call? ---No Guidelines Guideline Title Affirmed Question Affirmed Notes Nurse Date/Time (Eastern Time) Dizziness - Vertigo [1] Dizziness (vertigo) present now AND [2] age > 96 (Exception: prior physician evaluation Clovis Riley, RN, Georgina Peer 04/06/2021 11:55:48 AM Guidelines Guideline Title Affirmed Question Affirmed Notes Nurse Date/Time  Eilene Ghazi Time) for this AND no different/worse than usual) Disp. Time Eilene Ghazi Time) Disposition Final User 04/06/2021 11:35:35 AM Attempt made - message left Clovis Riley, RN, Georgina Peer 04/06/2021 11:57:30 AM Go to ED Now (or PCP triage) Yes Clovis Riley, RN, Leilani Merl Disagree/Comply Comply Caller Understands Yes PreDisposition Call Doctor Care Advice Given Per Guideline GO TO ED NOW (OR PCP TRIAGE): * IF NO PCP (PRIMARY CARE PROVIDER) SECOND-LEVEL TRIAGE: You need to be seen within the next hour. Go to the [facility] at _____________ [facility] as soon as you can. NOTE TO TRIAGER - DRIVING: * Another adult should drive. * Patient should not delay going to the emergency department. CARE ADVICE given per Dizziness - Vertigo (Adult) guideline. Referrals GO TO FACILITY UNDECIDED

## 2021-04-06 NOTE — ED Notes (Signed)
Discharge instructions discussed with pt. Pt verbalized understanding. Pt stable and ambulatory.  °

## 2021-04-12 ENCOUNTER — Other Ambulatory Visit: Payer: Self-pay | Admitting: Physician Assistant

## 2021-04-13 ENCOUNTER — Inpatient Hospital Stay: Payer: Medicare HMO | Attending: Hematology and Oncology | Admitting: Hematology and Oncology

## 2021-04-13 ENCOUNTER — Inpatient Hospital Stay: Payer: Medicare HMO

## 2021-04-13 ENCOUNTER — Encounter: Payer: Self-pay | Admitting: Hematology and Oncology

## 2021-04-13 ENCOUNTER — Other Ambulatory Visit: Payer: Self-pay

## 2021-04-13 DIAGNOSIS — K449 Diaphragmatic hernia without obstruction or gangrene: Secondary | ICD-10-CM | POA: Diagnosis not present

## 2021-04-13 DIAGNOSIS — K5909 Other constipation: Secondary | ICD-10-CM | POA: Diagnosis not present

## 2021-04-13 DIAGNOSIS — K649 Unspecified hemorrhoids: Secondary | ICD-10-CM | POA: Diagnosis not present

## 2021-04-13 DIAGNOSIS — E119 Type 2 diabetes mellitus without complications: Secondary | ICD-10-CM | POA: Insufficient documentation

## 2021-04-13 DIAGNOSIS — D649 Anemia, unspecified: Secondary | ICD-10-CM | POA: Diagnosis not present

## 2021-04-13 DIAGNOSIS — D869 Sarcoidosis, unspecified: Secondary | ICD-10-CM | POA: Insufficient documentation

## 2021-04-13 DIAGNOSIS — L814 Other melanin hyperpigmentation: Secondary | ICD-10-CM | POA: Diagnosis not present

## 2021-04-13 DIAGNOSIS — K5900 Constipation, unspecified: Secondary | ICD-10-CM | POA: Diagnosis not present

## 2021-04-13 DIAGNOSIS — D638 Anemia in other chronic diseases classified elsewhere: Secondary | ICD-10-CM

## 2021-04-13 DIAGNOSIS — Z86010 Personal history of colonic polyps: Secondary | ICD-10-CM | POA: Diagnosis not present

## 2021-04-13 NOTE — Assessment & Plan Note (Signed)
After significant evaluation, her overall cause of anemia is multifactorial, likely anemia of chronic illness related to her diabetes as well as her sarcoidosis She does not require treatment At hemoglobin greater than 10, typically, it should not affect her energy level or cause symptoms The patient is very active, in fact, after her appointments, she will be going for her Zumba class which she enjoys With stability of her blood count, the patient would like to discontinue follow-up which I think is reasonable I recommend her to call me if her hemoglobin dropped to 9 and below Otherwise, she can just follow-up with primary care doctor for routine surveillance of her blood work She is in agreement

## 2021-04-13 NOTE — Progress Notes (Signed)
[facility] OFFICE PROGRESS NOTE  [name], [place]  ASSESSMENT & PLAN:  Anemia of chronic disease After significant evaluation, her overall cause of anemia is multifactorial, likely anemia of chronic illness related to her diabetes as well as her sarcoidosis She does not require treatment At hemoglobin greater than 10, typically, it should not affect her energy level or cause symptoms The patient is very active, in fact, after her appointments, she will be going for her Zumba class which she enjoys With stability of her blood count, the patient would like to discontinue follow-up which I think is reasonable I recommend her to call me if her hemoglobin dropped to 9 and below Otherwise, she can just follow-up with primary care doctor for routine surveillance of her blood work She is in agreement  No orders of the defined types were placed in this encounter.   The total time spent in the appointment was 15 minutes encounter with patients including review of chart and various tests results, discussions about plan of care and coordination of care plan   All questions were answered. The patient knows to call the clinic with any problems, questions or concerns. No barriers to learning was detected.    Heath Lark, MD 2/10/202310:19 AM  INTERVAL HISTORY: Kristie King 71 y.o. female returns for follow-up on chronic anemia She is doing well No recent bleeding  SUMMARY OF HEMATOLOGIC HISTORY:  She was found to have abnormal CBC from CBC monitoring with her primary care doctor I have the opportunity to review his CBC dated back to 2014 Her hemoglobin typically range between 10.5-12.2 She denies recent chest pain on exertion, shortness of breath on minimal exertion, pre-syncopal episodes, or palpitations. She had not noticed any recent bleeding such as epistaxis, hematuria or hematochezia The patient denies over the counter NSAID ingestion. She is not on antiplatelets  agents. Her last colonoscopy was in 2018 and it was reported as normal but she is scheduled for another repeat colonoscopy next month She had no prior history or diagnosis of cancer. Her age appropriate screening programs are up-to-date. She denies any pica and eats a variety of diet. She never donated blood or received blood transfusion The patient was prescribed oral iron supplements and she takes iron twice a day usually around her mealtime She has occasional nausea She have intermittent chronic constipation She has history of sarcoidosis but she does not take chronic prednisone therapy She has been very active since retirement She has gained almost 10 pounds since COVID-19 restriction and is looking forward to exercising at the gym again On 09/28/2019: EGD showed No gross lesions in esophagus. - 2 cm hiatal hernia. - Erythematous mucosa in the gastric body. No other gross lesions in the stomach. Biopsied On 09/28/2019, colonoscopy showed  Hemorrhoids found on digital rectal exam. - Melanosis in the colon. - One 5 mm polyp at the hepatic flexure, removed with a cold snare. Resected and retrieved. - Non-bleeding non-thrombosed external and internal hemorrhoids. BIOPSY from 09/28/2019 1. Surgical [P], duodenal biopsies - BENIGN DUODENAL MUCOSA. - NO FEATURES OF CELIAC SPRUE OR GRANULOMAS. - GASTRIC FRAGMENTS WITH MILDLY ACTIVE CHRONIC GASTRITIS (HELICOBACTER PYLORI TYPE). 2. Surgical [P], gastric antrum and gastric body - GASTRIC MUCOSA WITH MILDLY ACTIVE CHRONIC HELICOBACTER PYLORI GASTRITIS. - WARTHIN-STARRY POSITIVE FOR HELICOBACTER PYLORI. - NO INTESTINAL METAPLASIA, DYSPLASIA OR CARCINOMA. 3. Surgical [P], colon, hepatic flexure, polyp - TUBULAR ADENOMA(S). - NO HIGH GRADE DYSPLASIA OR CARCINOMA.   I have reviewed the past  medical history, past surgical history, social history and family history with the patient and they are unchanged from previous note.  ALLERGIES:  has No  Known Allergies.  MEDICATIONS:  Current Outpatient Medications  Medication Sig Dispense Refill   furosemide (LASIX) 40 MG tablet TAKE 1/2 - 1 TAB BY MOUTH DAILY AS NEEDED FOR EDEMA 60 tablet 0   lisinopril (ZESTRIL) 5 MG tablet Take 1 tablet (5 mg total) by mouth daily. 90 tablet 3   meclizine (ANTIVERT) 25 MG tablet Take 1 tablet (25 mg total) by mouth 2 (two) times daily as needed for dizziness. 30 tablet 0   traZODone (DESYREL) 50 MG tablet TAKE 1/2 TO 1 TABLET BY MOUTH AT BEDTIME AS NEEDED FOR SLEEP 90 tablet 1   TRULICITY 1.5 EY/8.1KG SOPN INJECT 0.5 MLS INTO THE SKIN ONCE A WEEK. 4 mL 3   No current facility-administered medications for this visit.     REVIEW OF SYSTEMS:   Constitutional: Denies fevers, chills or night sweats Eyes: Denies blurriness of vision Ears, nose, mouth, throat, and face: Denies mucositis or sore throat Respiratory: Denies cough, dyspnea or wheezes Cardiovascular: Denies palpitation, chest discomfort or lower extremity swelling Gastrointestinal:  Denies nausea, heartburn or change in bowel habits Skin: Denies abnormal skin rashes Lymphatics: Denies new lymphadenopathy or easy bruising Neurological:Denies numbness, tingling or new weaknesses Behavioral/Psych: Mood is stable, no new changes  All other systems were reviewed with the patient and are negative.  PHYSICAL EXAMINATION: ECOG PERFORMANCE STATUS: 0 - Asymptomatic  Vitals:   04/13/21 0945  BP: (!) 120/93  Pulse: 78  Resp: 18  Temp: 97.7 F (36.5 C)  SpO2: 100%   Filed Weights   04/13/21 0945  Weight: 144 lb 3.2 oz (65.4 kg)    GENERAL:alert, no distress and comfortable NEURO: alert & oriented x 3 with fluent speech, no focal motor/sensory deficits  LABORATORY DATA:  I have reviewed the data as listed     Component Value Date/Time   NA 138 04/06/2021 1627   K 4.1 04/06/2021 1627   CL 98 04/06/2021 1627   CO2 32 04/06/2021 1627   GLUCOSE 82 04/06/2021 1627   BUN 27 (H)  04/06/2021 1627   CREATININE 1.06 (H) 04/06/2021 1627   CREATININE 0.94 10/18/2019 1049   CALCIUM 9.5 04/06/2021 1627   PROT 6.8 11/10/2020 1147   ALBUMIN 3.9 11/10/2020 1147   AST 20 11/10/2020 1147   ALT 8 11/10/2020 1147   ALKPHOS 49 11/10/2020 1147   BILITOT 0.6 11/10/2020 1147   GFRNONAA 57 (L) 04/06/2021 1627   GFRNONAA >60 10/11/2019 0931   GFRAA >60 10/11/2019 0931    No results found for: SPEP, UPEP  Lab Results  Component Value Date   WBC 4.9 04/06/2021   NEUTROABS 2.1 04/06/2021   HGB 11.9 (L) 04/06/2021   HCT 36.3 04/06/2021   MCV 88.8 04/06/2021   PLT 271 04/06/2021      Chemistry      Component Value Date/Time   NA 138 04/06/2021 1627   K 4.1 04/06/2021 1627   CL 98 04/06/2021 1627   CO2 32 04/06/2021 1627   BUN 27 (H) 04/06/2021 1627   CREATININE 1.06 (H) 04/06/2021 1627   CREATININE 0.94 10/18/2019 1049      Component Value Date/Time   CALCIUM 9.5 04/06/2021 1627   ALKPHOS 49 11/10/2020 1147   AST 20 11/10/2020 1147   ALT 8 11/10/2020 1147   BILITOT 0.6 11/10/2020 1147

## 2021-04-26 ENCOUNTER — Encounter: Payer: Self-pay | Admitting: Physician Assistant

## 2021-04-26 ENCOUNTER — Other Ambulatory Visit: Payer: Self-pay

## 2021-04-26 ENCOUNTER — Ambulatory Visit (INDEPENDENT_AMBULATORY_CARE_PROVIDER_SITE_OTHER): Payer: Medicare HMO | Admitting: Physician Assistant

## 2021-04-26 VITALS — BP 100/66 | HR 80 | Temp 97.5°F | Ht 63.0 in | Wt 141.4 lb

## 2021-04-26 DIAGNOSIS — M25562 Pain in left knee: Secondary | ICD-10-CM

## 2021-04-26 DIAGNOSIS — E1159 Type 2 diabetes mellitus with other circulatory complications: Secondary | ICD-10-CM

## 2021-04-26 DIAGNOSIS — I152 Hypertension secondary to endocrine disorders: Secondary | ICD-10-CM | POA: Diagnosis not present

## 2021-04-26 DIAGNOSIS — R42 Dizziness and giddiness: Secondary | ICD-10-CM | POA: Diagnosis not present

## 2021-04-26 LAB — CBC WITH DIFFERENTIAL/PLATELET
Basophils Absolute: 0 10*3/uL (ref 0.0–0.1)
Basophils Relative: 0.3 % (ref 0.0–3.0)
Eosinophils Absolute: 0.1 10*3/uL (ref 0.0–0.7)
Eosinophils Relative: 1 % (ref 0.0–5.0)
HCT: 35.8 % — ABNORMAL LOW (ref 36.0–46.0)
Hemoglobin: 11.7 g/dL — ABNORMAL LOW (ref 12.0–15.0)
Lymphocytes Relative: 25.5 % (ref 12.0–46.0)
Lymphs Abs: 1.4 10*3/uL (ref 0.7–4.0)
MCHC: 32.8 g/dL (ref 30.0–36.0)
MCV: 88.4 fl (ref 78.0–100.0)
Monocytes Absolute: 0.9 10*3/uL (ref 0.1–1.0)
Monocytes Relative: 16 % — ABNORMAL HIGH (ref 3.0–12.0)
Neutro Abs: 3.1 10*3/uL (ref 1.4–7.7)
Neutrophils Relative %: 57.2 % (ref 43.0–77.0)
Platelets: 272 10*3/uL (ref 150.0–400.0)
RBC: 4.05 Mil/uL (ref 3.87–5.11)
RDW: 13.8 % (ref 11.5–15.5)
WBC: 5.5 10*3/uL (ref 4.0–10.5)

## 2021-04-26 LAB — COMPREHENSIVE METABOLIC PANEL
ALT: 5 U/L (ref 0–35)
AST: 16 U/L (ref 0–37)
Albumin: 4.4 g/dL (ref 3.5–5.2)
Alkaline Phosphatase: 52 U/L (ref 39–117)
BUN: 22 mg/dL (ref 6–23)
CO2: 33 mEq/L — ABNORMAL HIGH (ref 19–32)
Calcium: 9.9 mg/dL (ref 8.4–10.5)
Chloride: 98 mEq/L (ref 96–112)
Creatinine, Ser: 1.34 mg/dL — ABNORMAL HIGH (ref 0.40–1.20)
GFR: 40.24 mL/min — ABNORMAL LOW (ref >60.00)
Glucose, Bld: 74 mg/dL (ref 70–99)
Potassium: 3.5 mEq/L (ref 3.5–5.1)
Sodium: 138 mEq/L (ref 135–145)
Total Bilirubin: 0.7 mg/dL (ref 0.2–1.2)
Total Protein: 7.1 g/dL (ref 6.0–8.3)

## 2021-04-26 LAB — TSH: TSH: 0.84 u[IU]/mL (ref 0.35–5.50)

## 2021-04-26 NOTE — Patient Instructions (Addendum)
It was great to see you!  Consider purchasing a glucometer to check your blood sugars when you are having these episodes -- you can purchase these at [facility]  We will update your blood work today  Please stop taking lisinopril Record blood pressure every other day -- write on your log Follow-up in 2-4 weeks, sooner if concerns If any NEW symptoms, please let me know   Please get an xray for your L knee An order for an xray has been put in for you. To get your xray, you can walk in at the [facility] location without a scheduled appointment.  The address is [address]. It is across the street from [facility] is located in the basement.  Hours of operation are M-F 8:30am to 5:00pm. Please note that they are closed for lunch between 12:30 and 1:00pm.   Take care,  [name] PA-C

## 2021-04-26 NOTE — Progress Notes (Signed)
Kristie King is a 71 y.o. female here for hypotension.   History of Present Illness:   Chief Complaint  Patient presents with   Hypotension    Pt c/o low blood pressure since Sunday, systolic 585-277, diastolic 70. Having some dizziness off and on.   Knee Pain    Pt c/o left knee pain, fell 2-3 weeks ago at the gym on the cement. Also having discomfort in Right knee.    Hypertension Currently compliant with taking lisinopril 5 mg daily and lasix 40 mg as needed with no complications. At home blood pressure readings are: checked daily for the past week with systolic ranging from 824-235 and diastolic staying in 36R range. Patient denies chest pain, SOB, blurred vision, dizziness, unusual headaches, lower leg swelling. Denies excessive caffeine intake, stimulant usage, excessive alcohol intake, or increase in salt consumption.  BP Readings from Last 3 Encounters:  04/26/21 100/66  04/13/21 (!) 120/93  04/06/21 126/73   Lightheadedness In addition to low blood pressure readings, Kristie King has also been experiencing intermittent lightheadedness that she believes could be related.States that she notices this sensation particularly when she stands upright following her being in a bending position. She has previously visited the ED on 04/06/21 for this issue, but was found to be WNL despite mild dehydration. She has since been trying to increase her water intake, but has found the sensation is still occurring.    Diabetes Current DM meds: Trulicity 1.5 mg weekly injection . Blood sugars at home are: not checked. Patient is compliant with medication. Denies: hypoglycemic or hyperglycemic episodes or symptoms. At this time she is not sure if lightheadedness could be caused by hypoglycemic episodes. She does express wanting to stay on trulicity due to this medication keeping her controlled to the point where she is not a full diabetic.   Lab Results  Component Value Date   HGBA1C 6.1 02/12/2021   Left  Knee Pain Pt presents with c/o left knee pain following a fall that occurred 2-3 weeks ago. States she was at the gym, when she fell on the cement and landed on her left knee. In an effort to manage her pain, she has tried applying voltaren gel to the area, but this hasn't provided her with much relief. Upon further discussion, she does admit that she is also experiencing discomfort in her right knee, but attributes this to hitting the side of it on her stairwell. Pt currently shows no concerns with her right knee discomfort. At this time she is mainly concerned at figuring out what is going on with her left knee. Denies swelling, tenderness upon palpation, or redness.    Past Medical History:  Diagnosis Date   AC (acromioclavicular) arthritis 11/13/2016   Allergy    SEASONAL   Cataract    BILATERAL-REMOVED   Cervical radiculopathy at C6 09/10/2016   CKD stage 2 due to type 2 diabetes mellitus ([facility]) 04/14/2018   Diabetes mellitus without complication ([facility])    History of lumbar laminectomy 2019   HTN (hypertension) 09/17/2012   Hyperlipidemia    Hyperlipidemia associated with type 2 diabetes mellitus ([facility]) 09/17/2012   Hypertension associated with diabetes ([facility]) 04/14/2018   Iron deficiency anemia 10/16/2016   Osteoarthritis of spine with radiculopathy, cervical region 08/03/2016   Vitamin D deficiency 10/16/2016     Social History   Tobacco Use   Smoking status: Former    Years: 10.00    Types: Cigarettes    Quit date: 01/15/1971  Years since quitting: 50.3   Smokeless tobacco: Never  Vaping Use   Vaping Use: Never used  Substance Use Topics   Alcohol use: Yes    Comment: 09/17/2012 "drink on special occasions only"   Drug use: No    Past Surgical History:  Procedure Laterality Date   BACK SURGERY  03/30/2018   BUNIONECTOMY Right 02/18/2013   BUNIONECTOMY Left 04/01/2013   CATARACTS      Family History  Problem Relation Age of Onset   Stroke Mother    Stroke Father     Hyperlipidemia Sister    Diabetes Brother    Hypertension Brother    Diabetes Sister    Hypertension Sister    Stroke Maternal Grandfather    Colon cancer Neg Hx    Stomach cancer Neg Hx    Esophageal cancer Neg Hx    Pancreatic cancer Neg Hx    Colon polyps Neg Hx    Ulcerative colitis Neg Hx     No Known Allergies  Current Medications:   Current Outpatient Medications:    furosemide (LASIX) 40 MG tablet, TAKE 1/2 - 1 TAB BY MOUTH DAILY AS NEEDED FOR EDEMA, Disp: 60 tablet, Rfl: 0   lisinopril (ZESTRIL) 5 MG tablet, Take 1 tablet (5 mg total) by mouth daily., Disp: 90 tablet, Rfl: 3   traZODone (DESYREL) 50 MG tablet, TAKE 1/2 TO 1 TABLET BY MOUTH AT BEDTIME AS NEEDED FOR SLEEP, Disp: 90 tablet, Rfl: 1   TRULICITY 1.5 HG/9.9ME SOPN, INJECT 0.5 MLS INTO THE SKIN ONCE A WEEK., Disp: 4 mL, Rfl: 3   Review of Systems:   ROS Negative unless otherwise specified per HPI. Vitals:   Vitals:   04/26/21 1137  BP: 100/66  Pulse: 80  Temp: (!) 97.5 F (36.4 C)  TempSrc: Temporal  Weight: 141 lb 6.1 oz (64.1 kg)  Height: 5\' 3"  (1.6 m)     Body mass index is 25.04 kg/m.  Physical Exam:   Physical Exam Vitals and nursing note reviewed.  Constitutional:      General: She is not in acute distress.    Appearance: She is well-developed. She is not ill-appearing or toxic-appearing.  Cardiovascular:     Rate and Rhythm: Normal rate and regular rhythm.     Pulses: Normal pulses.     Heart sounds: Normal heart sounds, S1 normal and S2 normal.  Pulmonary:     Effort: Pulmonary effort is normal.     Breath sounds: Normal breath sounds.  Musculoskeletal:     Comments: Left knee No physical deformity or TTP Normal ROM  Skin:    General: Skin is warm and dry.  Neurological:     Mental Status: She is alert.     GCS: GCS eye subscore is 4. GCS verbal subscore is 5. GCS motor subscore is 6.  Psychiatric:        Speech: Speech normal.        Behavior: Behavior normal. Behavior is  cooperative.    Assessment and Plan:   Episodic lightheadedness; HTN associated with DM No red flags Suspect possible over-control of HTN Update labs, will make recommendations accordingly Stop taking lisinopril 5 mg daily  Monitor BP every other day, keep a log of readings for future review  I did also recommend that she consider purchasing a glucometer to check her blood sugars when she is having these episodes --I again asked patient if she would be willing to decrease her Trulicity dose but she is  very reluctant to do so out of fear of developing diabetes. I discussed with her that I cannot rule out that she may also be having some issues with her blood sugar at this time.  Patient verbalized understanding. Follow up in 2-4 weeks, sooner if new symptoms or concerns occur  Acute pain of left knee No red flags Ordered Xray of left knee for further evaluation Will follow up based on results  Consider PT vs sports medicine referral  I,Havlyn C Ratchford,acting as a scribe for [employer], PA.,have documented all relevant documentation on the behalf of [name], PA,as directed by  [name], PA while in the presence of [name], [place].  I, [name], [place], have reviewed all documentation for this visit. The documentation on 04/27/21 for the exam, diagnosis, procedures, and orders are all accurate and complete.   [name], PA-C

## 2021-04-27 ENCOUNTER — Telehealth: Payer: Self-pay | Admitting: Physician Assistant

## 2021-04-27 ENCOUNTER — Other Ambulatory Visit: Payer: Self-pay | Admitting: Physician Assistant

## 2021-04-27 ENCOUNTER — Encounter: Payer: Self-pay | Admitting: Physician Assistant

## 2021-04-27 DIAGNOSIS — R944 Abnormal results of kidney function studies: Secondary | ICD-10-CM

## 2021-04-27 NOTE — Telephone Encounter (Signed)
Pt verified DOB, advised recommendations of scheudling a virtua/ office visit this afternoon. Pt states she is out of town and will continue using Desylum OTC and if not better by Monday she will call and schedule a visit. Pt verbalized understanding

## 2021-04-27 NOTE — Telephone Encounter (Signed)
Pt needs a cough medicine- stated she took over the counter meds last night but needs something stronger.

## 2021-04-27 NOTE — Telephone Encounter (Signed)
Please advise 

## 2021-05-01 ENCOUNTER — Encounter: Payer: Self-pay | Admitting: Gastroenterology

## 2021-05-01 ENCOUNTER — Ambulatory Visit: Payer: Medicare HMO | Admitting: Gastroenterology

## 2021-05-01 VITALS — BP 150/88 | Ht 63.0 in | Wt 151.0 lb

## 2021-05-01 DIAGNOSIS — Z86010 Personal history of colonic polyps: Secondary | ICD-10-CM | POA: Diagnosis not present

## 2021-05-01 DIAGNOSIS — R1311 Dysphagia, oral phase: Secondary | ICD-10-CM | POA: Insufficient documentation

## 2021-05-01 DIAGNOSIS — R053 Chronic cough: Secondary | ICD-10-CM | POA: Insufficient documentation

## 2021-05-01 DIAGNOSIS — Z8619 Personal history of other infectious and parasitic diseases: Secondary | ICD-10-CM | POA: Diagnosis not present

## 2021-05-01 DIAGNOSIS — J029 Acute pharyngitis, unspecified: Secondary | ICD-10-CM | POA: Diagnosis not present

## 2021-05-01 NOTE — Progress Notes (Signed)
[facility] VISIT   Primary Care Provider [name], [place] [address] [place] [address] [phone]  Patient Profile: JERELYN TRIMARCO is a 71 y.o. female with a pmh significant for diabetes, hypertension, insomnia, skin manifested sarcoidosis, colon polyps (TAs), H. pylori gastritis (status post treatment pending eradication), hemorrhoids.  The patient presents to the [facility] for an evaluation and management of problem(s) noted below:  Problem List 1. Sore throat   2. Chronic cough   3. Oral phase dysphagia   4. History of Helicobacter pylori infection   5. Hx of adenomatous colonic polyps     History of Present Illness Please see prior notes for full details of HPI.  Interval History The patient returns for a follow-up.  I have not seen the patient in quite a while since her endoscopy/colonoscopy back in 2021.  The patient over the course of the last year or so has been experiencing persistent issues of a sore throat and a chronic cough.  She is dealt with issues of sinus problems in the past.  Over the course the last few weeks with the weather changing she has had more issues.  She got to a point a few weeks ago where her uvula was felt to be enlarged and was causing significant issues with swallowing.  She has tried over-the-counter therapies and that have been helpful somewhat but she is still having sinus drainage.  She denies any overt heartburn symptoms.  She denies any acid indigestion.  She denies reflux.  When she had problems swallowing it was with the initiation of the food bolus transiting from the mouth into the esophagus that was difficult due to the soreness of her throat.  She has not had significant weight loss.  She denies any hematemesis or coffee-ground emesis.  She has had no changes in her bowel habits.  She denies any blood in her stools.  She is not taking significant nonsteroidals or BC/Goody  powders.  GI Review of Systems Positive as above Negative for nausea, vomiting, bloating  Review of Systems General: Denies fevers/chills/weight loss unintentionally HEENT: Denies oral ulcers/lesions Cardiovascular: Denies chest pain Pulmonary: Denies shortness of breath Gastroenterological: See HPI Genitourinary: Denies darkened urine Dermatological: Denies jaundice Psychological: Mood is stable   Medications Current Outpatient Medications  Medication Sig Dispense Refill   furosemide (LASIX) 40 MG tablet TAKE 1/2 - 1 TAB BY MOUTH DAILY AS NEEDED FOR EDEMA 60 tablet 0   traZODone (DESYREL) 50 MG tablet TAKE 1/2 TO 1 TABLET BY MOUTH AT BEDTIME AS NEEDED FOR SLEEP 90 tablet 1   TRULICITY 1.5 PZ/0.2HE SOPN INJECT 0.5 MLS INTO THE SKIN ONCE A WEEK. 4 mL 3   No current facility-administered medications for this visit.    Allergies No Known Allergies  Histories Past Medical History:  Diagnosis Date   AC (acromioclavicular) arthritis 11/13/2016   Allergy    SEASONAL   Cataract    BILATERAL-REMOVED   Cervical radiculopathy at C6 09/10/2016   CKD stage 2 due to type 2 diabetes mellitus ([facility]) 04/14/2018   Diabetes mellitus without complication (HCC)    History of lumbar laminectomy 2019   HTN (hypertension) 09/17/2012   Hyperlipidemia    Hyperlipidemia associated with type 2 diabetes mellitus ([place]) 09/17/2012   Hypertension associated with diabetes ([place]) 04/14/2018   Iron deficiency anemia 10/16/2016   Osteoarthritis of spine with radiculopathy, cervical region 08/03/2016   Vitamin D deficiency 10/16/2016   Past Surgical History:  Procedure  Laterality Date   BACK SURGERY  03/30/2018   BUNIONECTOMY Right 02/18/2013   BUNIONECTOMY Left 04/01/2013   CATARACTS     Social History   Socioeconomic History   Marital status: Married    Spouse name: Not on file   Number of children: 0   Years of education: Not on file   Highest education level: Not on file  Occupational History    Occupation: Retired    Fish farm manager: [employer]  Tobacco Use   Smoking status: Former    Years: 10.00    Types: Cigarettes    Quit date: 01/15/1971    Years since quitting: 50.3   Smokeless tobacco: Never  Vaping Use   Vaping Use: Never used  Substance and Sexual Activity   Alcohol use: Yes    Comment: 09/17/2012 "drink on special occasions only"   Drug use: No   Sexual activity: Yes    Partners: Male  Other Topics Concern   Not on file  Social History Narrative   Increased stress with impending death of brother whom lives in [place] Determinants of Health   Financial Resource Strain: Not on file  Food Insecurity: Not on file  Transportation Needs: Not on file  Physical Activity: Not on file  Stress: Not on file  Social Connections: Not on file  Intimate Partner Violence: Not on file   Family History  Problem Relation Age of Onset   Stroke Mother    Stroke Father    Hyperlipidemia Sister    Diabetes Brother    Hypertension Brother    Diabetes Sister    Hypertension Sister    Stroke Maternal Grandfather    Colon cancer Neg Hx    Stomach cancer Neg Hx    Esophageal cancer Neg Hx    Pancreatic cancer Neg Hx    Colon polyps Neg Hx    Ulcerative colitis Neg Hx    I have reviewed her medical, social, and family history in detail and updated the electronic medical record as necessary.    PHYSICAL EXAMINATION  BP (!) 150/88    Ht 5\' 3"  (1.6 m)    Wt 151 lb (68.5 kg)    BMI 26.75 kg/m  Wt Readings from Last 3 Encounters:  05/01/21 151 lb (68.5 kg)  04/26/21 141 lb 6.1 oz (64.1 kg)  04/13/21 144 lb 3.2 oz (65.4 kg)  GEN: NAD, appears stated age, doesn't appear chronically ill PSYCH: Cooperative, without pressured speech EYE: Conjunctivae pink, sclerae anicteric ENT: MMM, without oral ulcers, no erythema or exudates noted CV: Nontachycardic RESP: No audible wheezing GI: NABS, soft, NT/ND, without rebound or guarding MSK/EXT: No lower extremity edema SKIN: No  jaundice NEURO:  Alert & Oriented x 3, no focal deficits   REVIEW OF DATA  I reviewed the following data at the time of this encounter:  GI Procedures and Studies  Had a chance to review the following: July 2021 EGD - No gross lesions in esophagus. - 2 cm hiatal hernia. - Erythematous mucosa in the gastric body. No other gross lesions in the stomach. Biopsied. - No gross lesions in the duodenal bulb, in the first portion of the duodenum and in the second portion of the duodenum. Biopsied.  July 2021 colonoscopy - Hemorrhoids found on digital rectal exam. - Melanosis in the colon. - One 5 mm polyp at the hepatic flexure, removed with a cold snare. Resected and retrieved. - Non-bleeding non-thrombosed external and internal hemorrhoids.  Pathology Diagnosis 1.  Surgical [P], duodenal biopsies - BENIGN DUODENAL MUCOSA. - NO FEATURES OF CELIAC SPRUE OR GRANULOMAS. - GASTRIC FRAGMENTS WITH MILDLY ACTIVE CHRONIC GASTRITIS (HELICOBACTER PYLORI TYPE). 2. Surgical [P], gastric antrum and gastric body - GASTRIC MUCOSA WITH MILDLY ACTIVE CHRONIC HELICOBACTER PYLORI GASTRITIS. - WARTHIN-STARRY POSITIVE FOR HELICOBACTER PYLORI. - NO INTESTINAL METAPLASIA, DYSPLASIA OR CARCINOMA. 3. Surgical [P], colon, hepatic flexure, polyp - TUBULAR ADENOMA(S). - NO HIGH GRADE DYSPLASIA OR CARCINOMA.  Laboratory Studies  Reviewed those in epic  Imaging Studies  No relevant studies to review   ASSESSMENT  Ms. Vater is a 71 y.o. female with a pmh significant for diabetes, hypertension, insomnia, skin manifested sarcoidosis, colon polyps (TAs), H. pylori gastritis (status post treatment pending eradication), hemorrhoids.  The patient is seen today for evaluation and management of:  1. Sore throat   2. Chronic cough   3. Oral phase dysphagia   4. History of Helicobacter pylori infection   5. Hx of adenomatous colonic polyps    The patient is hemodynamically stable.  Clinically her symptoms seem to  be less likely GERD related as she is not having any overt symptoms.  The small chance of having extra esophageal symptoms leading to her sore throat and chronic cough can still be considered.  We discussed the potential PPI trial for a few weeks to see if it made any difference but to me my pretest chance of her symptoms being GERD related is much less likely especially even after having had her prior endoscopy in 2021.  I think an ENT evaluation and sinus evaluation seems most reasonable for her.  I think her dysphagia symptoms that she described were a result of the significant sore throat leading to an oral food bolus transit issue rather than true esophageal dysphagia.  We discussed the potential modified barium swallow but we are going to hold on that for now.  We will get a place in ENT evaluation referral and hopefully she will be able to see them soon.  We need to perform H. pylori stool antigen testing to ensure she has had eradication of her infection.  She will pick that up and bring that back within the coming weeks.  She will be due for colon cancer screening in 2028.  All patient questions were answered to the best of my ability, and the patient agrees to the aforementioned plan of action with follow-up as indicated.   PLAN  Proceed with ENT referral for further evaluation of sore throat/chronic cough Obtain stool antigen testing to ensure H. pylori eradication Consider modified barium swallow in future Consider empiric PPI therapy/treatment in future Colonoscopy for surveillance in 2028   Orders Placed This Encounter  Procedures   H. pylori antigen, stool   Ambulatory referral to ENT    New Prescriptions   No medications on file   Modified Medications   No medications on file    Planned Follow Up No follow-ups on file.   Total Time in Face-to-Face and in Coordination of Care for patient including independent/personal interpretation/review of prior testing, medical history,  examination, medication adjustment, communicating results with the patient directly, and documentation within the EHR is 25 minutes.   Justice Britain, MD [facility]oscopy Office # [phone]

## 2021-05-01 NOTE — Patient Instructions (Signed)
You have been referred to Ear, Nose and Throat. Dr Janace Hoard office will contact you with an appointment. If you have not heard from their office in 1-2 weeks, please contact office and let us know.   If you are age 71 or older, your body mass index should be between 23-30. Your Body mass index is 26.75 kg/m. If this is out of the aforementioned range listed, please consider follow up with your Primary Care Provider.  If you are age 59 or younger, your body mass index should be between 19-25. Your Body mass index is 26.75 kg/m. If this is out of the aformentioned range listed, please consider follow up with your Primary Care Provider.   ________________________________________________________  The [facility] providers would like to encourage you to use [facility] to communicate with providers for non-urgent requests or questions.  Due to long hold times on the telephone, sending your provider a message by [facility] may be a faster and more efficient way to get a response.  Please allow 48 business hours for a response.  Please remember that this is for non-urgent requests.  _______________________________________________________  Thank you for choosing me and [facility].  Dr. Rush Landmark

## 2021-05-06 ENCOUNTER — Other Ambulatory Visit: Payer: Self-pay

## 2021-05-06 ENCOUNTER — Emergency Department (HOSPITAL_BASED_OUTPATIENT_CLINIC_OR_DEPARTMENT_OTHER)
Admission: EM | Admit: 2021-05-06 | Discharge: 2021-05-06 | Disposition: A | Discharge status: Home / Self Care | Payer: Medicare HMO | Attending: Emergency Medicine | Admitting: Emergency Medicine

## 2021-05-06 ENCOUNTER — Encounter (HOSPITAL_BASED_OUTPATIENT_CLINIC_OR_DEPARTMENT_OTHER): Payer: Self-pay | Admitting: Emergency Medicine

## 2021-05-06 ENCOUNTER — Emergency Department (HOSPITAL_BASED_OUTPATIENT_CLINIC_OR_DEPARTMENT_OTHER): Payer: Medicare HMO | Admitting: Radiology

## 2021-05-06 DIAGNOSIS — N182 Chronic kidney disease, stage 2 (mild): Secondary | ICD-10-CM | POA: Insufficient documentation

## 2021-05-06 DIAGNOSIS — R059 Cough, unspecified: Secondary | ICD-10-CM | POA: Diagnosis not present

## 2021-05-06 DIAGNOSIS — E1122 Type 2 diabetes mellitus with diabetic chronic kidney disease: Secondary | ICD-10-CM | POA: Insufficient documentation

## 2021-05-06 DIAGNOSIS — R053 Chronic cough: Secondary | ICD-10-CM

## 2021-05-06 DIAGNOSIS — Z20822 Contact with and (suspected) exposure to covid-19: Secondary | ICD-10-CM | POA: Diagnosis not present

## 2021-05-06 DIAGNOSIS — Z87891 Personal history of nicotine dependence: Secondary | ICD-10-CM | POA: Insufficient documentation

## 2021-05-06 DIAGNOSIS — J029 Acute pharyngitis, unspecified: Secondary | ICD-10-CM | POA: Diagnosis not present

## 2021-05-06 DIAGNOSIS — I129 Hypertensive chronic kidney disease with stage 1 through stage 4 chronic kidney disease, or unspecified chronic kidney disease: Secondary | ICD-10-CM | POA: Diagnosis not present

## 2021-05-06 LAB — RESP PANEL BY RT-PCR (FLU A&B, COVID) ARPGX2
Influenza A by PCR: NEGATIVE
Influenza B by PCR: NEGATIVE
SARS Coronavirus 2 by RT PCR: NEGATIVE

## 2021-05-06 LAB — GROUP A STREP BY PCR: Group A Strep by PCR: NOT DETECTED

## 2021-05-06 MED ORDER — PANTOPRAZOLE SODIUM 20 MG PO TBEC: 20.0000 mg | DELAYED_RELEASE_TABLET | Freq: Every day | ORAL | 0 refills | Status: DC

## 2021-05-06 MED ORDER — ALBUTEROL SULFATE HFA 108 (90 BASE) MCG/ACT IN AERS
2.0000 | INHALATION_SPRAY | Freq: Once | RESPIRATORY_TRACT | Status: AC
  Administered 2021-05-06: 2 via RESPIRATORY_TRACT
  Filled 2021-05-06: qty 6.7

## 2021-05-06 NOTE — ED Triage Notes (Signed)
Pt has a severe sore throat for 6 months . Pt has been to multiple doctors including GI with no relief or diagnosis, told to see ENT and ENT cant see her til April. Pt states she is congested intermittently and has drainage down her throat at night . Not sleeping. Difficult to swallow. ?

## 2021-05-06 NOTE — ED Notes (Signed)
Tested negative for covid a week ago.  ?

## 2021-05-06 NOTE — ED Provider Notes (Signed)
?[facility] DEPT ?Provider Note ? ? ?CSN: [id number] ?Arrival date & time: 05/06/21  0909 ? ?  ? ?History ? ?Chief Complaint  ?Patient presents with  ? Sore Throat  ? ? ?Kristie King is a 71 y.o. female. ? ?HPI ? ?  ?71 year old female with a history of diabetes, hypertension, hyperlipidemia, CKD, Skin manifested sarcoidosis, H. pylori gastritis who presents with concern for persistent cough for 6 months.  ? ? ? ?She saw gastroenterology on 2/28 with concerns for sore throat and chronic cough.  They discussed a trial of PPI to see if it made a difference, although gastroenterologist felt since she did not have overt reflux symptoms that cough and sore throat related to GERD less likely.  He had recommended an ENT evaluation ? ?6 months of symptoms ? ?--sore throat was for about one week several weeks ago-Uvula was sore ?Had a throat spray, tried some iodine externally, gargling with warm water and salt, sucking cough drops, sugar free cough drops, sore throat was once a few weeks ago ? ?Cough has been chronic, continuing ?Not coughing anything up ?Chronic sinusitis, has had runny nose, more phlegm recently, took some alkaselzer for 2-3 days and feel like it did help some  ?Taking tea, ocugh drops for some relief , lemon and honey ? ?Smoked a long time ago, not excessively ? ?No fevers, shortness of breath, chest pain ?No nausea, vomiting, diarrhea, black or bloody stools ?No leg swelling now, sometimes will have it and take prn lasix ? ?Works out 4 days a week, rarely coughs during that, no dyspnea ? ?Was on lisinopril, then stopped 3 weeks ago due to lower BP, then started again recently ?  ?Past Medical History:  ?Diagnosis Date  ? AC (acromioclavicular) arthritis 11/13/2016  ? Allergy   ? SEASONAL  ? Cataract   ? BILATERAL-REMOVED  ? Cervical radiculopathy at C6 09/10/2016  ? CKD stage 2 due to type 2 diabetes mellitus ([place]) 04/14/2018  ? Diabetes mellitus without complication ([place])   ? History  of lumbar laminectomy 2019  ? HTN (hypertension) 09/17/2012  ? Hyperlipidemia   ? Hyperlipidemia associated with type 2 diabetes mellitus ([place]) 09/17/2012  ? Hypertension associated with diabetes ([place]) 04/14/2018  ? Iron deficiency anemia 10/16/2016  ? Osteoarthritis of spine with radiculopathy, cervical region 08/03/2016  ? Vitamin D deficiency 10/16/2016  ?  ? ?Home Medications ?Prior to Admission medications   ?Medication Sig Start Date End Date Taking? Authorizing Provider  ?pantoprazole (PROTONIX) 20 MG tablet Take 1 tablet (20 mg total) by mouth daily for 14 days. 05/06/21 05/20/21 Yes Gareth Morgan, MD  ?furosemide (LASIX) 40 MG tablet TAKE 1/2 - 1 TAB BY MOUTH DAILY AS NEEDED FOR EDEMA 04/12/21   [name], PA  ?traZODone (DESYREL) 50 MG tablet TAKE 1/2 TO 1 TABLET BY MOUTH AT BEDTIME AS NEEDED FOR SLEEP 01/10/20   Orma Flaming, MD  ?TRULICITY 1.5 RS/8.5IO SOPN INJECT 0.5 MLS INTO THE SKIN ONCE A WEEK. 07/14/20   Orma Flaming, MD  ?   ? ?Allergies    ?Patient has no known allergies.   ? ?Review of Systems   ?Review of Systems ? ?Physical Exam ?Updated Vital Signs ?BP (!) 157/85   Pulse 60   Temp 98 ?F (36.7 ?C) (Oral)   Resp 16   SpO2 100%  ?Physical Exam ?Vitals and nursing note reviewed.  ?Constitutional:   ?   General: She is not in acute distress. ?   Appearance: She  is well-developed. She is not diaphoretic.  ?HENT:  ?   Head: Normocephalic and atraumatic.  ?Eyes:  ?   Conjunctiva/sclera: Conjunctivae normal.  ?Cardiovascular:  ?   Rate and Rhythm: Normal rate and regular rhythm.  ?   Heart sounds: Normal heart sounds. No murmur heard. ?  No friction rub. No gallop.  ?Pulmonary:  ?   Effort: Pulmonary effort is normal. No respiratory distress.  ?   Breath sounds: Normal breath sounds. No wheezing or rales.  ?Abdominal:  ?   General: There is no distension.  ?   Palpations: Abdomen is soft.  ?   Tenderness: There is no abdominal tenderness. There is no guarding.  ?Musculoskeletal:     ?   General: No  tenderness.  ?   Cervical back: Normal range of motion.  ?Skin: ?   General: Skin is warm and dry.  ?   Findings: No erythema or rash.  ?Neurological:  ?   Mental Status: She is alert and oriented to person, place, and time.  ? ? ?ED Results / Procedures / Treatments   ?Labs ?(all labs ordered are listed, but only abnormal results are displayed) ?Labs Reviewed  ?RESP PANEL BY RT-PCR (FLU A&B, COVID) [id number]  ?GROUP A STREP BY PCR  ? ? ?EKG ?None ? ?Radiology ?DG Chest 2 View ? ?Result Date: 05/06/2021 ?CLINICAL DATA:  C/o sore throat and dry cough x 6 months. Unable to see ENT till April. Hx sarcoidosis EXAM: CHEST - 2 VIEW COMPARISON:  03/21/2021 FINDINGS: Cardiac silhouette is normal in size. No mediastinal or hilar masses or evidence of adenopathy. Lungs are clear.  No pleural effusion or pneumothorax. Skeletal structures are intact. IMPRESSION: No active cardiopulmonary disease. Electronically Signed   By: Lajean Manes M.D.   On: 05/06/2021 10:08   ? ?Procedures ?Procedures  ? ? ?Medications Ordered in ED ?Medications  ?albuterol (VENTOLIN HFA) 108 (90 Base) MCG/ACT inhaler 2 puff (2 puffs Inhalation Given 05/06/21 1026)  ? ? ?ED Course/ Medical Decision Making/ A&P ?  ?                        ? ?71 year old female with a history of diabetes, hypertension, hyperlipidemia, CKD, Skin manifested sarcoidosis, H. pylori gastritis who presents with concern for persistent cough for 6 months.  ? ?CXR obtained and evaluated by me shows no evidence of pneumonia, pneumothorax, pulmonary edema.  Low suspicoin for PE in absence of dyspnea. ? ?Discussed that asthma, reflux, postnasal gtt are among the most common causes of chronic cough, and that cough may be related to one of these etiologies however possible cough related to lisinopril, pulmonary sarcoidosis or other.  ? ?Given albuterol inhaler, rx for PPI. Recommend pulmonology follow up for chronic cough, as well sarcoidosis.  Patient discharged in stable condition with  understanding of reasons to return.  ? ? ? ? ? ? ? ? ?Final Clinical Impression(s) / ED Diagnoses ?Final diagnoses:  ?Chronic cough  ? ? ?Rx / DC Orders ?ED Discharge Orders   ? ?      Ordered  ?  pantoprazole (PROTONIX) 20 MG tablet  Daily       ? 05/06/21 1019  ? ?  ?  ? ?  ? ? ?  ?Gareth Morgan, MD ?05/07/21 0107 ? ?

## 2021-05-06 NOTE — Discharge Instructions (Signed)
You can use the albuterol inhaler 2 puffs as needed every 6 hours for cough.  ?

## 2021-05-09 ENCOUNTER — Telehealth: Payer: Self-pay | Admitting: Physician Assistant

## 2021-05-09 NOTE — Telephone Encounter (Signed)
Patient called in requesting to become a patient of [facility]. Patient is requesting to transfer from [name] to [facility]. ? ?Please advise. ?

## 2021-05-09 NOTE — Telephone Encounter (Signed)
Please see message. °

## 2021-05-10 ENCOUNTER — Other Ambulatory Visit: Payer: Medicare HMO

## 2021-05-10 ENCOUNTER — Ambulatory Visit (INDEPENDENT_AMBULATORY_CARE_PROVIDER_SITE_OTHER)
Admission: RE | Admit: 2021-05-10 | Discharge: 2021-05-10 | Disposition: A | Discharge status: Home / Self Care | Payer: Medicare HMO | Source: Ambulatory Visit | Attending: Physician Assistant | Admitting: Physician Assistant

## 2021-05-10 ENCOUNTER — Other Ambulatory Visit: Payer: Self-pay

## 2021-05-10 DIAGNOSIS — A048 Other specified bacterial intestinal infections: Secondary | ICD-10-CM | POA: Diagnosis not present

## 2021-05-10 DIAGNOSIS — M25562 Pain in left knee: Secondary | ICD-10-CM

## 2021-05-11 ENCOUNTER — Other Ambulatory Visit (INDEPENDENT_AMBULATORY_CARE_PROVIDER_SITE_OTHER): Payer: Medicare HMO

## 2021-05-11 DIAGNOSIS — D638 Anemia in other chronic diseases classified elsewhere: Secondary | ICD-10-CM | POA: Diagnosis not present

## 2021-05-11 LAB — CBC
HCT: 34.5 % — ABNORMAL LOW (ref 36.0–46.0)
Hemoglobin: 11.3 g/dL — ABNORMAL LOW (ref 12.0–15.0)
MCHC: 32.8 g/dL (ref 30.0–36.0)
MCV: 89.1 fl (ref 78.0–100.0)
Platelets: 305 10*3/uL (ref 150.0–400.0)
RBC: 3.87 Mil/uL (ref 3.87–5.11)
RDW: 14.4 % (ref 11.5–15.5)
WBC: 7.4 10*3/uL (ref 4.0–10.5)

## 2021-05-11 LAB — HELICOBACTER PYLORI  SPECIAL ANTIGEN
MICRO NUMBER:: 13109986
SPECIMEN QUALITY: ADEQUATE

## 2021-05-14 NOTE — Telephone Encounter (Signed)
Left voicemail for patient to call back to schedule TOC appt w/Dr.Hernandez. ? ?Please advise. ?

## 2021-05-17 ENCOUNTER — Other Ambulatory Visit: Payer: Self-pay | Admitting: Physician Assistant

## 2021-05-18 ENCOUNTER — Ambulatory Visit (INDEPENDENT_AMBULATORY_CARE_PROVIDER_SITE_OTHER): Payer: Medicare HMO | Admitting: Physician Assistant

## 2021-05-18 ENCOUNTER — Encounter: Payer: Self-pay | Admitting: Physician Assistant

## 2021-05-18 VITALS — BP 119/82 | HR 77 | Temp 97.6°F | Ht 63.0 in | Wt 143.8 lb

## 2021-05-18 DIAGNOSIS — E1159 Type 2 diabetes mellitus with other circulatory complications: Secondary | ICD-10-CM

## 2021-05-18 DIAGNOSIS — I10 Essential (primary) hypertension: Secondary | ICD-10-CM | POA: Diagnosis not present

## 2021-05-18 DIAGNOSIS — R053 Chronic cough: Secondary | ICD-10-CM

## 2021-05-18 NOTE — Progress Notes (Signed)
Kristie King is a 71 y.o. female here for a follow up of a pre-existing problem. ? ?History of Present Illness:  ? ?Chief Complaint  ?Patient presents with  ? Follow-up  ?  Pt is here for f/u, showing she needs pneumonia vaccine but had last year;   ? ? ?HPI ? ?HTN ?Following our previous visit on 04/26/21, pt is no longer taking the lisinopril 5 mg daily with no complications. At home blood pressure readings are: checked and WNL per patient's report. Patient denies chest pain, SOB, blurred vision, dizziness, unusual headaches, lower leg swelling. Denies excessive caffeine intake, stimulant usage, excessive alcohol intake, or increase in salt consumption. Her lightheadedness episodes that we discussed at our  [name] from Last 3 Encounters:  ?05/18/21 119/82  ?05/06/21 (!) 157/85  ?05/01/21 (!) 150/88  ? ? ?Chronic Cough ?On 05/06/21, pt presented to the ED with c/o chronic cough that has been ongoing for the past 6 months as well as a sore throat that had been onset for 1 week. At the time, she reported she had just followed up with Dr. Rush Landmark, gastroenterology, on 05/01/21 for chronic cough and although a trial of a PPI was discussed she was instead recommended to undergo ENT evaluation due to provider's belief that her sx were ENT related. In an effort to manage her sx, she stated she has tried an otc throat spray, gargling with warm water, and sucking on cough drops which provided minor relief. Denies fever, SOB, CP, nausea, vomiting, diarrhea, or hemoptysis.  [name], pt underwent a CXR, which showed no evidence of pneumonia, pneumothorax, or pulmonary edema. As a result of this, she was prescribed protonix 20 mg daily and an albuterol 108 mcg base inhaler to use prn as well as recommended to f/u with pulmonology for further evaluation.  ? ?Currently she reports she has trialed the protonix 20 mg daily, but found no change in sx. Pt describes feeling a tickle in her throat that worsens at night  and wakes her up. At this time she does have an appointment with Dr. Sallee Provencal, otolaryngology on 06/05/21 for further evaluation. She is not interested in further intervention on my part at this time. Denies CP, SOB, or past COVID 19 infection.  ? ? ? ?Past Medical History:  ?Diagnosis Date  ? AC (acromioclavicular) arthritis 11/13/2016  ? Allergy   ? SEASONAL  ? Cataract   ? BILATERAL-REMOVED  ? Cervical radiculopathy at C6 09/10/2016  ? CKD stage 2 due to type 2 diabetes mellitus ([place]) 04/14/2018  ? Diabetes mellitus without complication ([place])   ? History of lumbar laminectomy 2019  ? HTN (hypertension) 09/17/2012  ? Hyperlipidemia   ? Hyperlipidemia associated with type 2 diabetes mellitus ([place]) 09/17/2012  ? Hypertension associated with diabetes ([place]) 04/14/2018  ? Iron deficiency anemia 10/16/2016  ? Osteoarthritis of spine with radiculopathy, cervical region 08/03/2016  ? Vitamin D deficiency 10/16/2016  ? ?  ?Social History  ? ?Tobacco Use  ? Smoking status: Former  ?  Years: 10.00  ?  Types: Cigarettes  ?  Quit date: 01/15/1971  ?  Years since quitting: 50.3  ? Smokeless tobacco: Never  ?Vaping Use  ? Vaping Use: Never used  ?Substance Use Topics  ? Alcohol use: Yes  ?  Comment: 09/17/2012 "drink on special occasions only"  ? Drug use: No  ? ? ?Past Surgical History:  ?Procedure Laterality Date  ? BACK SURGERY  03/30/2018  ? BUNIONECTOMY  Right 02/18/2013  ? BUNIONECTOMY Left 04/01/2013  ? CATARACTS    ? ? ?Family History  ?Problem Relation Age of Onset  ? Stroke Mother   ? Stroke Father   ? Hyperlipidemia Sister   ? Diabetes Brother   ? Hypertension Brother   ? Diabetes Sister   ? Hypertension Sister   ? Stroke Maternal Grandfather   ? Colon cancer Neg Hx   ? Stomach cancer Neg Hx   ? Esophageal cancer Neg Hx   ? Pancreatic cancer Neg Hx   ? Colon polyps Neg Hx   ? Ulcerative colitis Neg Hx   ? ? ?No Known Allergies ? ?Current Medications:  ? ?Current Outpatient Medications:  ?  furosemide (LASIX) 40 MG tablet, TAKE  1/2 - 1 TAB BY MOUTH DAILY AS NEEDED FOR EDEMA, Disp: 30 tablet, Rfl: 1 ?  pantoprazole (PROTONIX) 20 MG tablet, Take 1 tablet (20 mg total) by mouth daily for 14 days., Disp: 14 tablet, Rfl: 0 ?  traZODone (DESYREL) 50 MG tablet, TAKE 1/2 TO 1 TABLET BY MOUTH AT BEDTIME AS NEEDED FOR SLEEP, Disp: 90 tablet, Rfl: 1 ?  TRULICITY 1.5 AS/5.0NL SOPN, INJECT 0.5 MLS INTO THE SKIN ONCE A WEEK., Disp: 4 mL, Rfl: 3  [name]  ? ?ROS ?Negative unless otherwise specified per HPI. ?Vitals:  ? ?Vitals:  ? 05/18/21 1051  ?BP: 119/82  ?Pulse: 77  ?Temp: 97.6 ?F (36.4 ?C)  ?TempSrc: Temporal  ?SpO2: 99%  ?Weight: 143 lb 12.8 oz (65.2 kg)  ?Height: '5\' 3"'$  (1.6 m)  ?   ?Body mass index is 25.47 kg/m?. ? ?Physical Exam:  ? ?Physical Exam ?Vitals and nursing note reviewed.  ?Constitutional:   ?   General: She is not in acute distress. ?   Appearance: She is well-developed. She is not ill-appearing or toxic-appearing.  ?Cardiovascular:  ?   Rate and Rhythm: Normal rate and regular rhythm.  ?   Pulses: Normal pulses.  ?   Heart sounds: Normal heart sounds, S1 normal and S2 normal.  ?Pulmonary:  ?   Effort: Pulmonary effort is normal.  ?   Breath sounds: Normal breath sounds.  ?Skin: ?   General: Skin is warm and dry.  ?Neurological:  ?   Mental Status: She is alert.  ?   GCS: GCS eye subscore is 4. GCS verbal subscore is 5. GCS motor subscore is 6.  ?Psychiatric:     ?   Speech: Speech normal.     ?   Behavior: Behavior normal. Behavior is cooperative.  ? ? ?Assessment and Plan:  ? ?Chronic cough ?Declines further intervention from our office ?Follow up with ENT for further evaluation  ? ?HTN ?Normotensive ?Continue prn lasix and hold lisinopril ?Defer further management to new PCP ? ?I,Havlyn C Ratchford,acting as a scribe for [employer], PA.,have documented all relevant documentation on the behalf of Kristie Coke, PA,as directed by  Kristie Coke, PA while in the presence of Kristie King, [place]. ? ?IInda Coke, PA, have reviewed all documentation for this visit. The documentation on 05/18/21 for the exam, diagnosis, procedures, and orders are all accurate and complete. ? ?Kristie Coke, PA-C ? ?

## 2021-05-18 NOTE — Patient Instructions (Signed)
It was great to see you! ? ?You have had one dose of Pneumonia -23 vaccine ?You are eligible for Pneumonia- 20 vaccine ?Please see handout about this and talk to your new PCP about this if you are interested ? ?Take care, ? ?[name] PA-C  ?

## 2021-05-21 DIAGNOSIS — R69 Illness, unspecified: Secondary | ICD-10-CM | POA: Diagnosis not present

## 2021-05-29 ENCOUNTER — Encounter (HOSPITAL_BASED_OUTPATIENT_CLINIC_OR_DEPARTMENT_OTHER): Payer: Self-pay | Admitting: Internal Medicine

## 2021-05-29 ENCOUNTER — Ambulatory Visit (HOSPITAL_BASED_OUTPATIENT_CLINIC_OR_DEPARTMENT_OTHER): Payer: Medicare HMO | Admitting: Internal Medicine

## 2021-05-29 ENCOUNTER — Other Ambulatory Visit: Payer: Self-pay

## 2021-05-29 VITALS — BP 130/61 | HR 62 | Ht 63.0 in | Wt 149.0 lb

## 2021-05-29 DIAGNOSIS — E7800 Pure hypercholesterolemia, unspecified: Secondary | ICD-10-CM

## 2021-05-29 DIAGNOSIS — R7989 Other specified abnormal findings of blood chemistry: Secondary | ICD-10-CM | POA: Diagnosis not present

## 2021-05-29 NOTE — Patient Instructions (Signed)
Medication Instructions:  ?Your physician recommends that you continue on your current medications as directed. Please refer to the Current Medication list given to you today. ? ?*If you need a refill on your cardiac medications before your next appointment, please call your pharmacy* ? ? ?Lab Work: ?FASTING lab work to check cholesterol -- NMR lipoprofile ? ?If you have labs (blood work) drawn today and your tests are completely normal, you will receive your results only by: ?MyChart Message (if you have MyChart) OR ?A paper copy in the mail ?If you have any lab test that is abnormal or we need to change your treatment, we will call you to review the results. ? ? ?Testing/Procedures: ?Dr. Debara Pickett has ordered a CT coronary calcium score.  ? ?Test locations:  ?[facility] ([address] [place], [place] [address]) ?[facility] ([address] [place], [place] [address])  ? ?This is $99 out of pocket. ? ? ?Coronary CalciumScan ?A coronary calcium scan is an imaging test used to look for deposits of calcium and other fatty materials (plaques) in the inner lining of the blood vessels of the heart (coronary arteries). These deposits of calcium and plaques can partly clog and narrow the coronary arteries without producing any symptoms or warning signs. This puts a person at risk for a heart attack. This test can detect these deposits before symptoms develop. ?Tell a health care provider about: ?Any allergies you have. ?All medicines you are taking, including vitamins, herbs, eye drops, creams, and over-the-counter medicines. ?Any problems you or family members have had with anesthetic medicines. ?Any blood disorders you have. ?Any surgeries you have had. ?Any medical conditions you have. ?Whether you are pregnant or may be pregnant. ?What are the risks? ?Generally, this is a safe procedure. However, problems may occur, including: ?Harm to a pregnant woman and her unborn baby. This test involves the use of  radiation. Radiation exposure can be dangerous to a pregnant woman and her unborn baby. If you are pregnant, you generally should not have this procedure done. ?Slight increase in the risk of cancer. This is because of the radiation involved in the test. ?What happens before the procedure? ?No preparation is needed for this procedure. ?What happens during the procedure? ?You will undress and remove any jewelry around your neck or chest. ?You will put on a hospital gown. ?Sticky electrodes will be placed on your chest. The electrodes will be connected to an electrocardiogram (ECG) machine to record a tracing of the electrical activity of your heart. ?A CT scanner will take pictures of your heart. During this time, you will be asked to lie still and hold your breath for 2-3 seconds while a picture of your heart is being taken. ?The procedure may vary among health care providers and hospitals. ?What happens after the procedure? ?You can get dressed. ?You can return to your normal activities. ?It is up to you to get the results of your test. Ask your health care provider, or the department that is doing the test, when your results will be ready. ?Summary ?A coronary calcium scan is an imaging test used to look for deposits of calcium and other fatty materials (plaques) in the inner lining of the blood vessels of the heart (coronary arteries). ?Generally, this is a safe procedure. Tell your health care provider if you are pregnant or may be pregnant. ?No preparation is needed for this procedure. ?A CT scanner will take pictures of your heart. ?You can return to your normal  activities after the scan is done. ?This information is not intended to replace advice given to you by your health care provider. Make sure you discuss any questions you have with your health care provider. ?Document Released: 08/17/2007 Document Revised: 01/08/2016 Document Reviewed: 01/08/2016 ?Elsevier Interactive Patient Education ? 2017 Elsevier  Inc. ? ? ? ?Follow-Up: ?At [facility], you and your health needs are our priority.  As part of our continuing mission to provide you with exceptional heart care, we have created designated Provider Care Teams.  These Care Teams include your primary Cardiologist (physician) and Advanced Practice Providers (APPs -  Physician Assistants and Nurse Practitioners) who all work together to provide you with the care you need, when you need it. ? ?We recommend signing up for the patient portal called "MyChart".  Sign up information is provided on this After Visit Summary.  MyChart is used to connect with patients for Virtual Visits (Telemedicine).  Patients are able to view lab/test results, encounter notes, upcoming appointments, etc.  Non-urgent messages can be sent to your provider as well.   ?To learn more about what you can do with MyChart, go to [email or web address].   ? ?Your next appointment:   ? ?AS NEEDED with Dr. Debara Pickett  ? ?

## 2021-05-29 NOTE — Progress Notes (Addendum)
? ? ?LIPID CLINIC CONSULT NOTE ? ?Chief Complaint:  ?Manage dyslipidemia ? ?Primary Care Physician: ?[name], PA ? ?Primary Cardiologist:  ?None ? ?HPI:  ?Kristie King is a 71 y.o. female who is being seen today for the evaluation of dyslipidemia at the request of Orma Flaming, MD. This is a pleasant 72 year old female kindly referred for evaluation management of dyslipidemia.  She was originally referred by Dr. Rogers Blocker, but has subsequently transferred care to Dr. Isaac Bliss which would start in May.  She has a history of marked dyslipidemia with labs in March 2022 showing total cholesterol 300, HDL 160 LDL 131 and triglycerides of 49.  She has been on no therapy for his dyslipidemia in the past.  She has a surprisingly high HDL cholesterol which historically has been considered cardioprotective however at this level many studies of indicated is probably pathogenic.  It is unknown whether he has any evidence of any coronary disease or not.  We discussed this at length today and I feel that she would benefit probably from calcium scoring. ? ?PMHx:  ?Past Medical History:  ?Diagnosis Date  ? AC (acromioclavicular) arthritis 11/13/2016  ? Allergy   ? SEASONAL  ? Cataract   ? BILATERAL-REMOVED  ? Cervical radiculopathy at C6 09/10/2016  ? CKD stage 2 due to type 2 diabetes mellitus ([place]) 04/14/2018  ? Diabetes mellitus without complication ([place])   ? History of lumbar laminectomy 2019  ? HTN (hypertension) 09/17/2012  ? Hyperlipidemia   ? Hyperlipidemia associated with type 2 diabetes mellitus ([place]) 09/17/2012  ? Hypertension associated with diabetes ([place]) 04/14/2018  ? Iron deficiency anemia 10/16/2016  ? Osteoarthritis of spine with radiculopathy, cervical region 08/03/2016  ? Vitamin D deficiency 10/16/2016  ? ? ?Past Surgical History:  ?Procedure Laterality Date  ? BACK SURGERY  03/30/2018  ? BUNIONECTOMY Right 02/18/2013  ? BUNIONECTOMY Left 04/01/2013  ? CATARACTS    ? ? ?FAMHx:  ?Family History  ?Problem  Relation Age of Onset  ? Stroke Mother   ? Stroke Father   ? Hyperlipidemia Sister   ? Diabetes Brother   ? Hypertension Brother   ? Diabetes Sister   ? Hypertension Sister   ? Stroke Maternal Grandfather   ? Colon cancer Neg Hx   ? Stomach cancer Neg Hx   ? Esophageal cancer Neg Hx   ? Pancreatic cancer Neg Hx   ? Colon polyps Neg Hx   ? Ulcerative colitis Neg Hx   ? ? ?SOCHx:  ? reports that she quit smoking about 50 years ago. Her smoking use included cigarettes. She has never used smokeless tobacco. She reports current alcohol use. She reports that she does not use drugs. ? ?ALLERGIES:  ?No Known Allergies ? ?ROS: ?Pertinent items noted in HPI and remainder of comprehensive ROS otherwise negative. ? ?HOME MEDS: ?Current Outpatient Medications on File Prior to Visit  ?Medication Sig Dispense Refill  ? furosemide (LASIX) 40 MG tablet TAKE 1/2 - 1 TAB BY MOUTH DAILY AS NEEDED FOR EDEMA 30 tablet 1  ? traZODone (DESYREL) 50 MG tablet TAKE 1/2 TO 1 TABLET BY MOUTH AT BEDTIME AS NEEDED FOR SLEEP 90 tablet 1  ? TRULICITY 1.5 JH/4.1DE SOPN INJECT 0.5 MLS INTO THE SKIN ONCE A WEEK. 4 mL 3  ? ?No current facility-administered medications on file prior to visit.  ? ? ?LABS/IMAGING: ?No results found for this or any previous visit (from the past 48 hour(s)). ?No results found. ? ?LIPID PANEL: ?   ?  Component Value Date/Time  ? CHOL 300 (H) 05/18/2020 1341  ? TRIG 49.0 05/18/2020 1341  ? HDL 160.00 05/18/2020 1341  ? CHOLHDL 2 05/18/2020 1341  ? VLDL 9.8 05/18/2020 1341  ? LDLCALC 131 (H) 05/18/2020 1341  ? ? ?WEIGHTS: ?Wt Readings from Last 3 Encounters:  ?05/29/21 149 lb (67.6 kg)  ?05/18/21 143 lb 12.8 oz (65.2 kg)  ?05/01/21 151 lb (68.5 kg)  ? ? ?VITALS: ?BP 130/61   Pulse 62   Ht '5\' 3"'$  (1.6 m)   Wt 149 lb (67.6 kg)   SpO2 96%   BMI 26.39 kg/m?  ? ?EXAM: ?General appearance: alert and no distress ?Neck: no carotid bruit, no JVD, and thyroid not enlarged, symmetric, no tenderness/mass/nodules ?Lungs: clear to  auscultation bilaterally ?Heart: regular rate and rhythm, S1, S2 normal, no murmur, click, rub or gallop ?Abdomen: soft, non-tender; bowel sounds normal; no masses,  no organomegaly ?Extremities: extremities normal, atraumatic, no cyanosis or edema ?Pulses: 2+ and symmetric ?Skin: Skin color, texture, turgor normal. No rashes or lesions ?Neurologic: Grossly normal ?: Pleasant ? ?EKG: ?Deferred ? ?ASSESSMENT: ?Mixed dyslipidemia with very high HDL cholesterol ? ?PLAN: ?1.   Ms. Oliveri has a mixed dyslipidemia with very high HDL cholesterol based on labs last year.  I would like to repeat a lipid NMR to understand this further.  We will also get a calcium score.  Ultimately she may not need any therapy.  Her traditional risk factors are low and if there is no evidence of coronary artery disease at age 52, I may not necessarily advocate for any medication. ? ?Thanks for this interesting referral. ? ?Pixie Casino, MD, [facility], FACP  ?[place]  ?Medical Director of the [facility] Lipid [facility] &  ?Cardi[facility] ?Diplomate of the [employer] of Clinical Lipidology ?Attending Cardiologist  ?Direct Dial: [phone]  Fax: [phone]  ?Website:  [email or web address] ? ?Nadean Corwin Ziere Docken ?05/29/2021, 2:48 PM  ?

## 2021-05-30 ENCOUNTER — Ambulatory Visit: Payer: Medicare HMO | Admitting: Pulmonary Disease

## 2021-05-30 ENCOUNTER — Encounter: Payer: Self-pay | Admitting: Pulmonary Disease

## 2021-05-30 ENCOUNTER — Other Ambulatory Visit: Payer: Self-pay | Admitting: Physician Assistant

## 2021-05-30 VITALS — BP 134/84 | HR 72 | Ht 64.0 in | Wt 149.2 lb

## 2021-05-30 DIAGNOSIS — D869 Sarcoidosis, unspecified: Secondary | ICD-10-CM

## 2021-05-30 DIAGNOSIS — R053 Chronic cough: Secondary | ICD-10-CM

## 2021-05-30 MED ORDER — ALBUTEROL SULFATE HFA 108 (90 BASE) MCG/ACT IN AERS: 2.0000 | INHALATION_SPRAY | Freq: Four times a day (QID) | RESPIRATORY_TRACT | 6 refills | Status: DC | PRN

## 2021-05-30 NOTE — Progress Notes (Signed)
? ? ?Subjective:  ? ?PATIENT ID: Kristie King GENDER: female DOB: Jul 29, 1950, MRN: [id number] ? ? ?HPI ? ?Chief Complaint  ?Patient presents with  ? Consult  ?  Cough been going on 8-93moand seems to be getting better ?Skin sarcoid  ? ? ?Reason for Visit: New consult for sarcoid ? ?Ms. Kristie Lukehartis 71year old female with sarcoidosis, arthritis, HTN, DM2, HLD who presents as a new consult. ? ?She intially presented with facial and arm lesions and diagnosed via skin biopsy in the 1970s. She was treated with high dose steroids and methotrexate in the 80s. She was previously [place] Last note in 10/22/12 by [facility] and tapered off steroids. Note comments that she had sarcoid involving lung, skin, sinus and larynx. She was seen by Dr. WVanessa Kickin [facility] in 2016 and had received injections for facial lesions. ? ?She reports cough for the last 8-9 months. She was seen by GI. Scheduled for ENT next month. Seen by PCP and advised to hold lisinopril for her hypertension. Has started some reflux medication with help in cough. Reports nasal congestion ? ?Denies fever, fatigue, weight loss, visual disturbance, dyspnea, cough, palpitations, abdominal pain, numbness, tingling, lightheadedness, syncope  ? ?Social History: ?Retired [employer] employee ? ?Environmental exposures: Asbestosis ? ?I have personally reviewed patient's past medical/family/social history, allergies, current medications. ? ?Past Medical History:  ?Diagnosis Date  ? AC (acromioclavicular) arthritis 11/13/2016  ? Allergy   ? SEASONAL  ? Cataract   ? BILATERAL-REMOVED  ? Cervical radiculopathy at C6 09/10/2016  ? CKD stage 2 due to type 2 diabetes mellitus ([place] 04/14/2018  ? Diabetes mellitus without complication [place]   ? History of lumbar laminectomy 2019  ? HTN (hypertension) 09/17/2012  ? Hyperlipidemia   ? Hyperlipidemia associated with type 2 diabetes mellitus ([place] 09/17/2012  ? Hypertension associated with diabetes  ([facility] [place] 04/14/2018  ? Iron deficiency anemia 10/16/2016  ? Osteoarthritis of spine with radiculopathy, cervical region 08/03/2016  ? Vitamin D deficiency 10/16/2016  ?  ? ?Family History  ?Problem Relation Age of Onset  ? Stroke Mother   ? Stroke Father   ? Hyperlipidemia Sister   ? Diabetes Brother   ? Hypertension Brother   ? Diabetes Sister   ? Hypertension Sister   ? Stroke Maternal Grandfather   ? Colon cancer Neg Hx   ? Stomach cancer Neg Hx   ? Esophageal cancer Neg Hx   ? Pancreatic cancer Neg Hx   ? Colon polyps Neg Hx   ? Ulcerative colitis Neg Hx   ?  ? ?Social History  ? ?Occupational History  ? Occupation: Retired  ?  Employer: [employer]  ?Tobacco Use  ? Smoking status: Former  ?  Years: 10.00  ?  Types: Cigarettes  ?  Quit date: 01/15/1971  ?  Years since quitting: 50.4  ? Smokeless tobacco: Never  ?Vaping Use  ? Vaping Use: Never used  ?Substance and Sexual Activity  ? Alcohol use: Yes  ?  Comment: 09/17/2012 "drink on special occasions only"  ? Drug use: No  ? Sexual activity: Yes  ?  Partners: Male  ? ? ?No Known Allergies  ? ?Outpatient Medications Prior to Visit  ?Medication Sig Dispense Refill  ? furosemide (LASIX) 40 MG tablet TAKE 1/2 - 1 TAB BY MOUTH DAILY AS NEEDED FOR EDEMA 30 tablet 1  ? traZODone (DESYREL) 50 MG tablet TAKE 1/2 TO 1 TABLET BY MOUTH  AT BEDTIME AS NEEDED FOR SLEEP 90 tablet 1  ? TRULICITY 1.5 CX/4.4YJ SOPN INJECT 0.5 MLS INTO THE SKIN ONCE A WEEK. 4 mL 3  ? ?No facility-administered medications prior to visit.  ? ? ?Review of Systems  ?Constitutional:  Negative for chills, diaphoresis, fever, malaise/fatigue and weight loss.  ?HENT:  Negative for congestion.   ?Respiratory:  Positive for shortness of breath. Negative for cough, hemoptysis, sputum production and wheezing.   ?Cardiovascular:  Negative for chest pain, palpitations and leg swelling.  ? ? ?Objective:  ? ?Vitals:  ? 05/30/21 1404  ?BP: 134/84  ?Pulse: 72  ?SpO2: 100%  ?Weight: 149 lb 3.2 oz (67.7 kg)  ?Height: '5\' 4"'$  (1.626 m)   ? ?SpO2: 100 % ?O2 Device: None (Room air) ? ? ? ?Data Reviewed: ? ?Imaging: ?CXR 05/06/21 - No infiltrate, effusion or edema ? ?PFT: ?None on file ? ?Labs: ?CBC ?   ?Component Value Date/Time  ? WBC 7.4 05/11/2021 0915  ? RBC 3.87 05/11/2021 0915  ? HGB 11.3 (L) 05/11/2021 0915  ? HCT 34.5 (L) 05/11/2021 0915  ? PLT 305.0 05/11/2021 0915  ? MCV 89.1 05/11/2021 0915  ? MCH 29.1 04/06/2021 1627  ? MCHC 32.8 05/11/2021 0915  ? RDW 14.4 05/11/2021 0915  ? LYMPHSABS 1.4 04/26/2021 1204  ? MONOABS 0.9 04/26/2021 1204  ? EOSABS 0.1 04/26/2021 1204  ? BASOSABS 0.0 04/26/2021 1204  ? ?Absolute eos 04/26/21 - 100 ? ?   ?Assessment & Plan:  ? ?Discussion: ?71 year old female with sarcoidosis, arthritis, HTN, DM2, HLD who presents as a new consult for sarcoid. Reviewed history. ? ?We discussed the clinical course of sarcoid and management including serial PFTs, labs, eye exam, and EKG and chest imaging if indicated. If symptoms suggest sarcoid flare in the future, we would manage with steroids +/- biologics. ? ?Common causes of cough were discussed including upper airway cough syndrome, reflux and undiagnosed obstructive lung disease. We reviewed evaluation and management for cough as noted below. ? ? ?Pulmonary sarcoidosis ?--Dx in 1970s via skin bx ?--Send ophthalmology note to our office. Fax [phone] ATTN: Dr. Loanne Drilling ?--Please schedule Dermatology visit ?--Please request the following labs from PCP: CBC with diff, CMET, 1, 25 and 25 hydroxy vitamin D ? ?Sarcoid Monitoring ?--Recent chest imaging reviewed.  ?--Annual PFTs. Scheduled for next appointment ?--Annual ophthalmology exam. Scheduled for appointment [name]  ?--EKG at next visit ?--In the future routine labs: CBC with diff, CMET, 1, 25 and 25 hydroxy vitamin D ? ?Chronic cough ?--ARRANGE for pulmonary function tests ?--START albuterol TWO puffs at night prior to bed.  ?--START Flonase (fluticasone) 1 spray per nostril every night ?--Allergies: START Claritin  (loratadine) OR Zyrtec (cetrizine) 10 mg once a day. Buy over-the counter ?--Reflux: START omeprazole 40 mg once a day. Buy over-the-counter ? ?Health Maintenance ?Immunization History  ?Administered Date(s) Administered  ? Fluad Quad(high Dose 65+) 11/11/2018, 11/10/2020  ? Influenza, High Dose Seasonal PF 12/12/2017  ? Influenza,inj,Quad PF,6+ Mos 03/09/2015  ? Influenza-Unspecified 02/07/2017  ? PFIZER(Purple Top)SARS-COV-2 Vaccination 04/08/2019, 04/29/2019  ? Pneumococcal Polysaccharide-23 05/18/2020  ? ?CT Lung Screen - not qualified. Quit smoking >50 lbs ? ?Orders Placed This Encounter  ?Procedures  ? Pulmonary function test  ?  Standing Status:   Future  ?  Standing Expiration Date:   05/31/2022  ?  Order Specific Question:   Where should this test be performed?  ?  Answer:   [facility]  ?  Order Specific Question:  Full PFT: includes the following: basic spirometry, spirometry pre & post bronchodilator, diffusion capacity (DLCO), lung volumes  ?  Answer:   Full PFT  ? ?Meds ordered this encounter  ?Medications  ? albuterol (VENTOLIN HFA) 108 (90 Base) MCG/ACT inhaler  ?  Sig: Inhale 2 puffs into the lungs every 6 (six) hours as needed for wheezing or shortness of breath.  ?  Dispense:  8 g  ?  Refill:  6  ? ? ?No follow-ups on file. After PFTs in May ? ?I have spent a total time of 60-minutes on the day of the appointment reviewing prior documentation, coordinating care and discussing medical diagnosis and plan with the patient/family. Imaging, labs and tests included in this note have been reviewed and interpreted independently by me. ? ?Kirkland Figg Rodman Pickle, MD ?[facility] ?05/30/2021 2:21 PM  ?Office Number [phone] ? ? ?

## 2021-05-30 NOTE — Patient Instructions (Addendum)
Chronic cough ?--ARRANGE for pulmonary function tests ?--START albuterol TWO puffs at night prior to bed.  ?--START Flonase (fluticasone) 1 spray per nostril every night ?--Allergies: START Claritin (loratadine) OR Zyrtec (cetrizine) 10 mg once a day. Buy over-the counter ?--Reflux: START omeprazole 40 mg once a day. Buy over-the-counter ? ?Sarcoid ?--Send ophthalmology note to our office. Fax [phone] ATTN: Dr. Loanne Drilling ?--Please schedule Dermatology visit ?--Please request the following labs from PCP: CBC with diff, CMET, 1, 25 and 25 hydroxy vitamin D ? ?Follow-up with me in May with PFTs prior to visit ? ? ?

## 2021-05-31 ENCOUNTER — Encounter: Payer: Self-pay | Admitting: Pulmonary Disease

## 2021-06-01 LAB — NMR, LIPOPROFILE
Cholesterol, Total: 323 mg/dL — ABNORMAL HIGH (ref 100–199)
HDL Particle Number: 40 umol/L (ref >=30.5)
HDL-C: >152 mg/dL (ref >39)
LDL Particle Number: 930 nmol/L (ref <1000)
LDL Size: 22.1 nm (ref >20.5)
LDL-C (NIH Calc): >164 mg/dL — ABNORMAL HIGH (ref 0–99)
LP-IR Score: <25 (ref <=45)
Small LDL Particle Number: <90 nmol/L (ref <=527)
Triglycerides: 50 mg/dL (ref 0–149)

## 2021-06-05 DIAGNOSIS — J384 Edema of larynx: Secondary | ICD-10-CM | POA: Diagnosis not present

## 2021-06-05 DIAGNOSIS — J3489 Other specified disorders of nose and nasal sinuses: Secondary | ICD-10-CM | POA: Diagnosis not present

## 2021-06-05 DIAGNOSIS — R053 Chronic cough: Secondary | ICD-10-CM | POA: Diagnosis not present

## 2021-06-05 DIAGNOSIS — J342 Deviated nasal septum: Secondary | ICD-10-CM | POA: Diagnosis not present

## 2021-06-05 DIAGNOSIS — J320 Chronic maxillary sinusitis: Secondary | ICD-10-CM | POA: Diagnosis not present

## 2021-06-05 DIAGNOSIS — Z9889 Other specified postprocedural states: Secondary | ICD-10-CM | POA: Diagnosis not present

## 2021-06-08 DIAGNOSIS — J320 Chronic maxillary sinusitis: Secondary | ICD-10-CM | POA: Insufficient documentation

## 2021-06-08 DIAGNOSIS — K219 Gastro-esophageal reflux disease without esophagitis: Secondary | ICD-10-CM | POA: Insufficient documentation

## 2021-06-18 DIAGNOSIS — R69 Illness, unspecified: Secondary | ICD-10-CM | POA: Diagnosis not present

## 2021-07-05 ENCOUNTER — Encounter: Payer: Self-pay | Admitting: Physician Assistant

## 2021-07-05 ENCOUNTER — Ambulatory Visit (INDEPENDENT_AMBULATORY_CARE_PROVIDER_SITE_OTHER): Payer: Medicare HMO | Admitting: Physician Assistant

## 2021-07-05 VITALS — BP 140/80 | HR 76 | Temp 97.7°F | Ht 63.0 in | Wt 150.5 lb

## 2021-07-05 DIAGNOSIS — J020 Streptococcal pharyngitis: Secondary | ICD-10-CM | POA: Diagnosis not present

## 2021-07-05 LAB — POCT RAPID STREP A (OFFICE): Rapid Strep A Screen: POSITIVE — AB

## 2021-07-05 MED ORDER — AMOXICILLIN 500 MG PO CAPS: 500.0000 mg | ORAL_CAPSULE | Freq: Two times a day (BID) | ORAL | 0 refills | Status: AC

## 2021-07-05 MED ORDER — CHERATUSSIN AC 100-10 MG/5ML PO SYRP: 5.0000 mL | ORAL_SOLUTION | Freq: Every evening | ORAL | 0 refills | Status: DC | PRN

## 2021-07-05 NOTE — Patient Instructions (Signed)
It was great to see you! ? ?You have strep ? ?Start amoxicillin 500 mg twice daily ? ?Cough syrup sent in for you ? ?May take ibuprofen for pain/swelling of throat ? ?Take care, ? ?[name] PA-C  ?

## 2021-07-05 NOTE — Progress Notes (Signed)
Kristie King is a 71 y.o. female here for a new problem ? ?History of Present Illness:  ? ?Chief Complaint  ?Patient presents with  ? Cough  ?  Pt c/o coughing since Sunday, expectorating green sputum. Nasal congestion, laryngitis, low energy, some SOB. ?She has been using Delysm, Alka-seltzer plus, prednisone x 3 days.  ? ? ?HPI ? ?Cough ?Patient present with cough that has been onset for the past 5 days. She notes she recently came back from a trip at [facility]. Pt describes cough as productive with green sputum. Her cough seems to be worsening since yesterday. Her accompanied symptoms include nasal congestion, low energy, laryngitis and some shortness of breath. She has decreased appetite. She has tried Delsym cough suppressant and Alka-seltzer with no relief.  She also has been taking Prednisone for the past 3 days. She has to go somewhere this upcoming weekend and wants to feel better. No reported fever or chills. No nausea or vomiting. No diarrhea. Denies chills or hemoptysis. No ear pressure or pain.  ? ? ?Past Medical History:  ?Diagnosis Date  ? AC (acromioclavicular) arthritis 11/13/2016  ? Allergy   ? SEASONAL  ? Cataract   ? BILATERAL-REMOVED  ? Cervical radiculopathy at C6 09/10/2016  ? CKD stage 2 due to type 2 diabetes mellitus ([place]) 04/14/2018  ? Diabetes mellitus without complication ([place])   ? History of lumbar laminectomy 2019  ? HTN (hypertension) 09/17/2012  ? Hyperlipidemia   ? Hyperlipidemia associated with type 2 diabetes mellitus ([place]) 09/17/2012  ? Hypertension associated with diabetes ([place]) 04/14/2018  ? Iron deficiency anemia 10/16/2016  ? Osteoarthritis of spine with radiculopathy, cervical region 08/03/2016  ? Vitamin D deficiency 10/16/2016  ? ?  ?Social History  ? ?Tobacco Use  ? Smoking status: Former  ?  Years: 10.00  ?  Types: Cigarettes  ?  Quit date: 01/15/1971  ?  Years since quitting: 50.5  ? Smokeless tobacco: Never  ?Vaping Use  ? Vaping Use: Never used  ?Substance Use Topics  ?  Alcohol use: Yes  ?  Comment: 09/17/2012 "drink on special occasions only"  ? Drug use: No  ? ? ?Past Surgical History:  ?Procedure Laterality Date  ? BACK SURGERY  03/30/2018  ? BUNIONECTOMY Right 02/18/2013  ? BUNIONECTOMY Left 04/01/2013  ? CATARACTS    ? ? ?Family History  ?Problem Relation Age of Onset  ? Stroke Mother   ? Stroke Father   ? Hyperlipidemia Sister   ? Diabetes Brother   ? Hypertension Brother   ? Diabetes Sister   ? Hypertension Sister   ? Stroke Maternal Grandfather   ? Colon cancer Neg Hx   ? Stomach cancer Neg Hx   ? Esophageal cancer Neg Hx   ? Pancreatic cancer Neg Hx   ? Colon polyps Neg Hx   ? Ulcerative colitis Neg Hx   ? ? ?No Known Allergies ? ?Current Medications:  ? ?Current Outpatient Medications:  ?  albuterol (VENTOLIN HFA) 108 (90 Base) MCG/ACT inhaler, Inhale 2 puffs into the lungs every 6 (six) hours as needed for wheezing or shortness of breath., Disp: 8 g, Rfl: 6 ?  amoxicillin (AMOXIL) 500 MG capsule, Take 1 capsule (500 mg total) by mouth 2 (two) times daily for 10 days., Disp: 20 capsule, Rfl: 0 ?  furosemide (LASIX) 40 MG tablet, TAKE 1/2 TO 1 TABLET BY MOUTH DAILY AS NEEDED FOR EDEMA, Disp: 90 tablet, Rfl: 1 ?  guaiFENesin-codeine (CHERATUSSIN AC) 100-10  MG/5ML syrup, Take 5 mLs by mouth at bedtime as needed for cough or congestion., Disp: 120 mL, Rfl: 0 ?  traZODone (DESYREL) 50 MG tablet, TAKE 1/2 TO 1 TABLET BY MOUTH AT BEDTIME AS NEEDED FOR SLEEP, Disp: 90 tablet, Rfl: 1 ?  TRULICITY 1.5 WU/1.3KG SOPN, INJECT 0.5 MLS INTO THE SKIN ONCE A WEEK., Disp: 4 mL, Rfl: 3  [name]  ? ?ROS ?Negative unless otherwise specified per HPI.  ? ?Vitals:  ? ?Vitals:  ? 07/05/21 1122  ?BP: 140/80  ?Pulse: 76  ?Temp: 97.7 ?F (36.5 ?C)  ?TempSrc: Temporal  ?Weight: 150 lb 8 oz (68.3 kg)  ?Height: '5\' 3"'$  (1.6 m)  ?   ?Body mass index is 26.66 kg/m?. ? ?Physical Exam:  ? ?Physical Exam ?Vitals and nursing note reviewed.  ?Constitutional:   ?   General: She is not in acute  distress. ?   Appearance: She is well-developed. She is not ill-appearing or toxic-appearing.  ?HENT:  ?   Head: Normocephalic and atraumatic.  ?   Right Ear: Tympanic membrane, ear canal and external ear normal. Tympanic membrane is not erythematous, retracted or bulging.  ?   Left Ear: Tympanic membrane, ear canal and external ear normal. Tympanic membrane is not erythematous, retracted or bulging.  ?   Nose: Nose normal.  ?   Right Sinus: No maxillary sinus tenderness or frontal sinus tenderness.  ?   Left Sinus: No maxillary sinus tenderness or frontal sinus tenderness.  ?   Mouth/Throat:  ?   Pharynx: Uvula midline. Posterior oropharyngeal erythema present.  ?Eyes:  ?   General: Lids are normal.  ?   Conjunctiva/sclera: Conjunctivae normal.  ?Neck:  ?   Trachea: Trachea normal.  ?Cardiovascular:  ?   Rate and Rhythm: Normal rate and regular rhythm.  ?   Heart sounds: Normal heart sounds, S1 normal and S2 normal.  ?Pulmonary:  ?   Effort: Pulmonary effort is normal.  ?   Breath sounds: Normal breath sounds. No decreased breath sounds, wheezing, rhonchi or rales.  ?Lymphadenopathy:  ?   Cervical: No cervical adenopathy.  ?Skin: ?   General: Skin is warm and dry.  ?Neurological:  ?   Mental Status: She is alert.  ?Psychiatric:     ?   Speech: Speech normal.     ?   Behavior: Behavior normal. Behavior is cooperative.  ? ?Results for orders placed or performed in visit on 07/05/21  ?POCT rapid strep A  ?Result Value Ref Range  ? Rapid Strep A Screen Positive (A) Negative  ? ? ?Assessment and Plan:  ? ?Strep pharyngitis ?Strep positive ?No red flags ?Start oral amoxicillin 500 mg BID x 10 days ?She is requesting cheratussin -- I have sent this in, sleepy precautions advised ?Ibuprofen for pain/swelling ?Follow-up if worsening/new sx ? ?I,Savera Zaman,acting as a Education administrator for [employer], PA.,have documented all relevant documentation on the behalf of [name], PA,as directed by  [name], PA while in  the presence of [name], [place].  ? ?IInda Coke, PA, have reviewed all documentation for this visit. The documentation on 07/05/21 for the exam, diagnosis, procedures, and orders are all accurate and complete. ? ? ?[name], PA-C ? ?

## 2021-07-06 ENCOUNTER — Ambulatory Visit (INDEPENDENT_AMBULATORY_CARE_PROVIDER_SITE_OTHER): Payer: Medicare HMO | Admitting: Pulmonary Disease

## 2021-07-06 ENCOUNTER — Encounter: Payer: Self-pay | Admitting: Pulmonary Disease

## 2021-07-06 ENCOUNTER — Ambulatory Visit: Payer: Medicare HMO | Admitting: Pulmonary Disease

## 2021-07-06 VITALS — BP 122/66 | HR 93 | Temp 98.6°F | Ht 64.0 in | Wt 147.0 lb

## 2021-07-06 DIAGNOSIS — D869 Sarcoidosis, unspecified: Secondary | ICD-10-CM

## 2021-07-06 DIAGNOSIS — R053 Chronic cough: Secondary | ICD-10-CM

## 2021-07-06 LAB — BASIC METABOLIC PANEL
BUN: 15 mg/dL (ref 6–23)
CO2: 33 mEq/L — ABNORMAL HIGH (ref 19–32)
Calcium: 9.7 mg/dL (ref 8.4–10.5)
Chloride: 95 mEq/L — ABNORMAL LOW (ref 96–112)
Creatinine, Ser: 0.94 mg/dL (ref 0.40–1.20)
GFR: 61.49 mL/min (ref >60.00)
Glucose, Bld: 167 mg/dL — ABNORMAL HIGH (ref 70–99)
Potassium: 2.8 mEq/L — CL (ref 3.5–5.1)
Sodium: 137 mEq/L (ref 135–145)

## 2021-07-06 MED ORDER — POTASSIUM CHLORIDE CRYS ER 20 MEQ PO TBCR: 20.0000 meq | EXTENDED_RELEASE_TABLET | Freq: Two times a day (BID) | ORAL | 0 refills | Status: DC

## 2021-07-06 MED ORDER — PREDNISONE 5 MG PO TABS: ORAL_TABLET | ORAL | 0 refills | Status: AC

## 2021-07-06 MED ORDER — IPRATROPIUM BROMIDE 0.03 % NA SOLN: 2.0000 | Freq: Two times a day (BID) | NASAL | 5 refills | Status: DC

## 2021-07-06 NOTE — Patient Instructions (Signed)
Performed Full PFT Today.   ?

## 2021-07-06 NOTE — Progress Notes (Signed)
Performed Full PFT today.  ?

## 2021-07-06 NOTE — Patient Instructions (Addendum)
Pulmonary sarcoidosis ?--EKG today ?--ORDER BMET. Rechecking creatinine. If abnormal, will refer to Nephrology ?--START steroid taper for cough (prednisone 20 mg x 4days, 10 mg x 4days, 5 mg x 4 days) ? ?Chronic cough ?--START atrovent 1 spray per nostril every night ?--Allergies: CONTINUE Zyrtec (cetrizine) 10 mg once a day. Buy over-the counter ?--Reflux: RESTART omeprazole 40 mg once a day. Buy over-the-counter ? ? ? ?Follow-up with me in 3 months ?

## 2021-07-06 NOTE — Progress Notes (Signed)
? ? ?Subjective:  ? ?PATIENT ID: Kristie King GENDER: female DOB: Jul 21, 1950, MRN: [id number] ? ? ?HPI ? ?Chief Complaint  ?Patient presents with  ? Follow-up  ?  Pt states she still has her cough. Pt states she got strep throat yesterday and was placed on Amoxicillin. She said her mucus is green in color right now. Pt had full PFTs done today   ? ? ?Reason for Visit: Follow-up ? ?[name] is 71 year old female with sarcoidosis, arthritis, HTN, DM2, HLD who presents for follow-up. ? ?Initial consult ?She intially presented with facial and arm lesions and diagnosed via skin biopsy in the 1970s. She was treated with high dose steroids and methotrexate in the 80s. She was previously followed by Pulmonary at [place]. Last note in 10/22/12 by [facility] and tapered off steroids. Note comments that she had sarcoid involving lung, skin, sinus and larynx. She was seen by Dr. Vanessa Kick in Dermatology in 2016 and had received injections for facial lesions. ? ?She reports cough for the last 8-9 months. She was seen by GI. Scheduled for ENT next month. Seen by PCP and advised to hold lisinopril for her hypertension. Has started some reflux medication with help in cough. Reports nasal congestion ? ?07/06/21 ?She recently returned from vacation. Diagnosed with strep throat and started on amoxicillin. She reports cough before this illness remains persistent. When she held her lisinopril but no appreciable difference. Uses flonase nightly. However had nose bleeds x 2. Stopped omeprazole after 10 days  ? ?Social History: ?Retired [employer] employee ? ?Environmental exposures: Asbestosis ? ?Past Medical History:  ?Diagnosis Date  ? AC (acromioclavicular) arthritis 11/13/2016  ? Allergy   ? SEASONAL  ? Cataract   ? BILATERAL-REMOVED  ? Cervical radiculopathy at C6 09/10/2016  ? CKD stage 2 due to type 2 diabetes mellitus ([place]) 04/14/2018  ? Diabetes mellitus without complication ([place])   ? History of lumbar laminectomy 2019  ? HTN  (hypertension) 09/17/2012  ? Hyperlipidemia   ? Hyperlipidemia associated with type 2 diabetes mellitus ([place]) 09/17/2012  ? Hypertension associated with diabetes ([place]) 04/14/2018  ? Iron deficiency anemia 10/16/2016  ? Osteoarthritis of spine with radiculopathy, cervical region 08/03/2016  ? Vitamin D deficiency 10/16/2016  ?  ? ?Family History  ?Problem Relation Age of Onset  ? Stroke Mother   ? Stroke Father   ? Hyperlipidemia Sister   ? Diabetes Brother   ? Hypertension Brother   ? Diabetes Sister   ? Hypertension Sister   ? Stroke Maternal Grandfather   ? Colon cancer Neg Hx   ? Stomach cancer Neg Hx   ? Esophageal cancer Neg Hx   ? Pancreatic cancer Neg Hx   ? Colon polyps Neg Hx   ? Ulcerative colitis Neg Hx   ?  ? ?Social History  ? ?Occupational History  ? Occupation: Retired  ?  Employer: [employer]  ?Tobacco Use  ? Smoking status: Former  ?  Years: 10.00  ?  Types: Cigarettes  ?  Quit date: 01/15/1971  ?  Years since quitting: 50.5  ? Smokeless tobacco: Never  ?Vaping Use  ? Vaping Use: Never used  ?Substance and Sexual Activity  ? Alcohol use: Yes  ?  Comment: 09/17/2012 "drink on special occasions only"  ? Drug use: No  ? Sexual activity: Yes  ?  Partners: Male  ? ? ?No Known Allergies  ? ?Outpatient Medications Prior to Visit  ?Medication Sig Dispense  Refill  ? albuterol (VENTOLIN HFA) 108 (90 Base) MCG/ACT inhaler Inhale 2 puffs into the lungs every 6 (six) hours as needed for wheezing or shortness of breath. 8 g 6  ? amoxicillin (AMOXIL) 500 MG capsule Take 1 capsule (500 mg total) by mouth 2 (two) times daily for 10 days. 20 capsule 0  ? furosemide (LASIX) 40 MG tablet TAKE 1/2 TO 1 TABLET BY MOUTH DAILY AS NEEDED FOR EDEMA 90 tablet 1  ? guaiFENesin-codeine (CHERATUSSIN AC) 100-10 MG/5ML syrup Take 5 mLs by mouth at bedtime as needed for cough or congestion. 120 mL 0  ? traZODone (DESYREL) 50 MG tablet TAKE 1/2 TO 1 TABLET BY MOUTH AT BEDTIME AS NEEDED FOR SLEEP 90 tablet 1  ? TRULICITY 1.5 AY/0.4HT SOPN INJECT  0.5 MLS INTO THE SKIN ONCE A WEEK. 4 mL 3  ? ?No facility-administered medications prior to visit.  ? ? ?Review of Systems  ?Constitutional:  Negative for chills, diaphoresis, fever, malaise/fatigue and weight loss.  ?HENT:  Negative for congestion.   ?Respiratory:  Positive for cough. Negative for hemoptysis, sputum production, shortness of breath and wheezing.   ?Cardiovascular:  Negative for chest pain, palpitations and leg swelling.  ? ? ?Objective:  ? ?Vitals:  ? 07/06/21 1335  ?BP: 122/66  ?Pulse: 93  ?Temp: 98.6 ?F (37 ?C)  ?TempSrc: Oral  ?SpO2: 100%  ?Weight: 147 lb (66.7 kg)  ?Height: _0  (1.626 m)  ? ?SpO2: 100 % ?O2 Device: None (Room air) ? ?Physical Exam: ?General: Well-appearing, no acute distress ?HENT: [place], AT ?Eyes: EOMI, no scleral icterus ?Respiratory: Clear to auscultation bilaterally.  No crackles, wheezing or rales ?Cardiovascular: RRR, -M/R/G, no JVD ?Extremities:-Edema,-tenderness ?Neuro: AAO x4, CNII-XII grossly intact ?Psych: Normal mood, normal affect ? ? ?Data Reviewed: ? ?Imaging: ?CXR 05/06/21 - No infiltrate, effusion or edema ? ?PFT: ?07/06/21 ?FVC 2.16 (91%) FEV1 1.56 (85%) Ratio 69  TLC 103% DLCO 100% ?Interpretation: Normal PFTs. No obstructive or restrictive defect. No significant bronchodilator response however does not preclude benefit of inhalers ? ? ?Labs: ? ?  Latest Ref Rng & Units 05/11/2021  ?  9:15 AM 04/26/2021  ? 12:04 PM 04/06/2021  ?  4:27 PM  ?CBC  ?WBC 4.0 - 10.5 K/uL 7.4   5.5   4.9    ?Hemoglobin 12.0 - 15.0 g/dL 11.3   11.7   11.9    ?Hematocrit 36.0 - 46.0 % 34.5   35.8   36.3    ?Platelets 150.0 - 400.0 K/uL 305.0   272.0   271    ?Stable anemia ? ? ? ?  Latest Ref Rng & Units 07/06/2021  ?  2:15 PM 04/26/2021  ? 12:04 PM 04/06/2021  ?  4:27 PM  ?BMP  ?Glucose 70 - 99 mg/dL 167   74   82    ?BUN 6 - 23 mg/dL _1 ?Creatinine 0.40 - 1.20 mg/dL 0.94   1.34   1.06    ?Sodium 135 - 145 mEq/L 137   138   138    ?Potassium 3.5 - 5.1 mEq/L 2.8   3.5   4.1    ?Chloride  96 - 112 mEq/L 95   98   98    ?CO2 19 - 32 mEq/L 33   33   32    ?Calcium 8.4 - 10.5 mg/dL 9.7   9.9   9.5    ?Increased Cr since 02/2021 ? ? ? ?  Latest Ref Rng & Units 04/26/2021  ? 12:04 PM 11/10/2020  ? 11:47 AM 05/18/2020  ?  1:41 PM  ?Hepatic Function  ?Total Protein 6.0 - 8.3 g/dL 7.1   6.8   7.2    ?Albumin 3.5 - 5.2 g/dL 4.4   3.9   4.3    ?AST 0 - 37 U/L _0 ?ALT 0 - 35 U/L _1 ?Alk Phosphatase 39 - 117 U/L 52   49   51    ?Total Bilirubin 0.2 - 1.2 mg/dL 0.7   0.6   0.6    ?Normal LFTs ? ?Absolute eos 04/26/21 - 100 ? ?   ?Assessment & Plan:  ? ?Discussion: ?71 year old female with sarcoidosis, arthritis, HTN, DM2, HLD who presents for follow-up. ? ?We discussed the clinical course of sarcoid and management including serial PFTs, labs, eye exam, and EKG and chest imaging if indicated. If symptoms suggest sarcoid flare in the future, we would manage with steroids +/- biologics. ? ?Common causes of cough were discussed including upper airway cough syndrome, reflux and undiagnosed obstructive lung disease. We reviewed evaluation and management for cough as noted below. ? ?Pulmonary sarcoidosis ?--Dx in 1970s via skin bx ?--Send ophthalmology note to our office. Fax [phone] ATTN: Dr. Loanne Drilling ?--Scheduled for Dermatology next visit. ?--Please request the following labs from PCP: CBC with diff, CMET, 1, 25 and 25 hydroxy vitamin D ? ?Sarcoid Monitoring ?--Recent chest imaging reviewed.  ?--Annual PFTs. Due in 18-24 hours ?--Annual ophthalmology exam. Scheduled for appointment [facility]  ?--EKG today. NSR ?--ORDER BMET ? ?Chronic cough ?Normal PFTs ?--CONTINUE albuterol TWO puffs at night prior to bed.  ?--STOP Flonase (fluticasone) nasap spray ?--START atrovent 1 spray per nostril every night ?--Allergies: CONTINUE Zyrtec (cetrizine) 10 mg once a day. Buy over-the counter ?--Reflux: RESTART omeprazole 40 mg once a day. Buy over-the-counter ? ?ADDENDUM: ?Hypokalemia ?--I have attempted to call  patient regarding low potassium level. Left message advising to STOP home lasix and to eat potassium rich foods ?--I have ordered potassium supplement after clinic visit  ?--She will need repeat BMET

## 2021-07-09 ENCOUNTER — Ambulatory Visit (INDEPENDENT_AMBULATORY_CARE_PROVIDER_SITE_OTHER): Payer: Medicare HMO | Admitting: Internal Medicine

## 2021-07-09 ENCOUNTER — Encounter: Payer: Self-pay | Admitting: Internal Medicine

## 2021-07-09 ENCOUNTER — Telehealth: Payer: Self-pay | Admitting: Pulmonary Disease

## 2021-07-09 VITALS — BP 140/70 | HR 82 | Temp 97.9°F | Ht 64.0 in | Wt 153.7 lb

## 2021-07-09 DIAGNOSIS — E1169 Type 2 diabetes mellitus with other specified complication: Secondary | ICD-10-CM

## 2021-07-09 DIAGNOSIS — E876 Hypokalemia: Secondary | ICD-10-CM

## 2021-07-09 DIAGNOSIS — Z23 Encounter for immunization: Secondary | ICD-10-CM

## 2021-07-09 DIAGNOSIS — D869 Sarcoidosis, unspecified: Secondary | ICD-10-CM | POA: Diagnosis not present

## 2021-07-09 DIAGNOSIS — I152 Hypertension secondary to endocrine disorders: Secondary | ICD-10-CM

## 2021-07-09 DIAGNOSIS — R053 Chronic cough: Secondary | ICD-10-CM

## 2021-07-09 DIAGNOSIS — E7800 Pure hypercholesterolemia, unspecified: Secondary | ICD-10-CM

## 2021-07-09 DIAGNOSIS — E1159 Type 2 diabetes mellitus with other circulatory complications: Secondary | ICD-10-CM | POA: Diagnosis not present

## 2021-07-09 LAB — POCT GLYCOSYLATED HEMOGLOBIN (HGB A1C): Hemoglobin A1C: 5.7 % — AB (ref 4.0–5.6)

## 2021-07-09 LAB — BASIC METABOLIC PANEL
BUN: 19 mg/dL (ref 6–23)
CO2: 26 mEq/L (ref 19–32)
Calcium: 9.4 mg/dL (ref 8.4–10.5)
Chloride: 103 mEq/L (ref 96–112)
Creatinine, Ser: 0.84 mg/dL (ref 0.40–1.20)
GFR: 70.38 mL/min (ref >60.00)
Glucose, Bld: 110 mg/dL — ABNORMAL HIGH (ref 70–99)
Potassium: 3.3 mEq/L — ABNORMAL LOW (ref 3.5–5.1)
Sodium: 137 mEq/L (ref 135–145)

## 2021-07-09 NOTE — Patient Instructions (Signed)
-  Nice seeing you today!! ? ?-Lab work today; will notify you once results are available. ? ?-Check BP at home. ? ?-Omeprazole 40 mg daily. ? ?-Schedule follow up in 3 months for your physical Come in fasting. ? ?-Contact Dr. Lysbeth Penner office to reschedule your coronary CT. ?

## 2021-07-09 NOTE — Progress Notes (Signed)
? ? ?New Patient Office Visit ? ? ? ? ?CC/Reason for Visit: Establish care, discuss chronic medical conditions ?Previous PCP: [name], PA ?Last Visit: March 2023 ? ?HPI: Kristie King is a 71 y.o. female who is coming in today for the above mentioned reasons. Past Medical History is significant for: Hypertension, hyperlipidemia, type 2 diabetes and sarcoidosis that was diagnosed via skin biopsy as a teenager.  She has recently been having issues with a chronic cough.  She has seen ENT and now pulmonary.  She had PFTs done last week.  She has follow-up scheduled with dermatology and ophthalmology.  She has resumed taking omeprazole 40 mg daily at the urging of pulmonary.  She is overdue for pneumonia, shingles, tetanus vaccinations. ? ? ?Past Medical/Surgical History: ?Past Medical History:  ?Diagnosis Date  ? AC (acromioclavicular) arthritis 11/13/2016  ? Allergy   ? SEASONAL  ? Cataract   ? BILATERAL-REMOVED  ? Cervical radiculopathy at C6 09/10/2016  ? CKD stage 2 due to type 2 diabetes mellitus ([place]) 04/14/2018  ? Diabetes mellitus without complication ([place])   ? History of lumbar laminectomy 2019  ? HTN (hypertension) 09/17/2012  ? Hyperlipidemia   ? Hyperlipidemia associated with type 2 diabetes mellitus ([place]) 09/17/2012  ? Hypertension associated with diabetes ([place]) 04/14/2018  ? Iron deficiency anemia 10/16/2016  ? Osteoarthritis of spine with radiculopathy, cervical region 08/03/2016  ? Vitamin D deficiency 10/16/2016  ? ? ?Past Surgical History:  ?Procedure Laterality Date  ? BACK SURGERY  03/30/2018  ? BUNIONECTOMY Right 02/18/2013  ? BUNIONECTOMY Left 04/01/2013  ? CATARACTS    ? ? ?Social History: ? reports that she quit smoking about 50 years ago. Her smoking use included cigarettes. She has never used smokeless tobacco. She reports current alcohol use. She reports that she does not use drugs. ? ?Allergies: ?No Known Allergies ? ?Family History:  ?Family History  ?Problem Relation Age of Onset  ? Stroke  Mother   ? Stroke Father   ? Hyperlipidemia Sister   ? Diabetes Brother   ? Hypertension Brother   ? Diabetes Sister   ? Hypertension Sister   ? Stroke Maternal Grandfather   ? Colon cancer Neg Hx   ? Stomach cancer Neg Hx   ? Esophageal cancer Neg Hx   ? Pancreatic cancer Neg Hx   ? Colon polyps Neg Hx   ? Ulcerative colitis Neg Hx   ? ? ? ?Current Outpatient Medications:  ?  albuterol (VENTOLIN HFA) 108 (90 Base) MCG/ACT inhaler, Inhale 2 puffs into the lungs every 6 (six) hours as needed for wheezing or shortness of breath., Disp: 8 g, Rfl: 6 ?  amoxicillin (AMOXIL) 500 MG capsule, Take 1 capsule (500 mg total) by mouth 2 (two) times daily for 10 days., Disp: 20 capsule, Rfl: 0 ?  furosemide (LASIX) 40 MG tablet, TAKE 1/2 TO 1 TABLET BY MOUTH DAILY AS NEEDED FOR EDEMA, Disp: 90 tablet, Rfl: 1 ?  guaiFENesin-codeine (CHERATUSSIN AC) 100-10 MG/5ML syrup, Take 5 mLs by mouth at bedtime as needed for cough or congestion., Disp: 120 mL, Rfl: 0 ?  ipratropium (ATROVENT) 0.03 % nasal spray, Place 2 sprays into both nostrils every 12 (twelve) hours., Disp: 30 mL, Rfl: 5 ?  predniSONE (DELTASONE) 5 MG tablet, Take 4 tablets (20 mg total) by mouth daily with breakfast for 4 days, THEN 2 tablets (10 mg total) daily with breakfast for 4 days, THEN 1 tablet (5 mg total) daily with breakfast for 4  days., Disp: 28 tablet, Rfl: 0 ?  traZODone (DESYREL) 50 MG tablet, TAKE 1/2 TO 1 TABLET BY MOUTH AT BEDTIME AS NEEDED FOR SLEEP, Disp: 90 tablet, Rfl: 1 ?  TRULICITY 1.5 PJ/0.9TO SOPN, INJECT 0.5 MLS INTO THE SKIN ONCE A WEEK., Disp: 4 mL, Rfl: 3 ?  potassium chloride SA (KLOR-CON M) 20 MEQ tablet, Take 1 tablet (20 mEq total) by mouth 2 (two) times daily for 2 days., Disp: 4 tablet, Rfl: 0 [name]  ?Constitutional: Denies fever, chills, diaphoresis, appetite change and fatigue.  ?HEENT: Denies photophobia, eye pain, redness, hearing loss, ear pain, congestion, sore throat, rhinorrhea, sneezing, mouth sores, trouble  swallowing, neck pain, neck stiffness and tinnitus.   ?Respiratory: Denies SOB, DOE, chest tightness,  and wheezing.   ?Cardiovascular: Denies chest pain, palpitations and leg swelling.  ?Gastrointestinal: Denies nausea, vomiting, abdominal pain, diarrhea, constipation, blood in stool and abdominal distention.  ?Genitourinary: Denies dysuria, urgency, frequency, hematuria, flank pain and difficulty urinating.  ?Endocrine: Denies: hot or cold intolerance, sweats, changes in hair or nails, polyuria, polydipsia. ?Musculoskeletal: Denies myalgias, back pain, joint swelling, arthralgias and gait problem.  ?Skin: Denies pallor, rash and wound.  ?Neurological: Denies dizziness, seizures, syncope, weakness, light-headedness, numbness and headaches.  ?Hematological: Denies adenopathy. Easy bruising, personal or family bleeding history  ?Psychiatric/Behavioral: Denies suicidal ideation, mood changes, confusion, nervousness, sleep disturbance and agitation ? ? ? ?Physical Exam: ?Vitals:  ? 07/09/21 1307 07/09/21 1311  ?BP: (!) 150/70 140/70  ?Pulse: 82   ?Temp: 97.9 ?F (36.6 ?C)   ?TempSrc: Oral   ?SpO2: 100%   ?Weight: 153 lb 11.2 oz (69.7 kg)   ?Height: '5\' 4"'$  (1.626 m)   ? ?Body mass index is 26.38 kg/m?. ? ? ?Constitutional: NAD, calm, comfortable ?Eyes: PERRL, lids and conjunctivae normal ?ENMT: Mucous membranes are moist.  ?Respiratory: clear to auscultation bilaterally, no wheezing, no crackles. Normal respiratory effort. No accessory muscle use.  ?Cardiovascular: Regular rate and rhythm, no murmurs / rubs / gallops. No extremity edema.  ?Neurologic: CN 2-12 grossly intact. Sensation intact, DTR normal. Strength 5/5 in all 4.  ?Psychiatric: Normal judgment and insight. Alert and oriented x 3. Normal mood.  ? ? ?Impression and Plan: ? ?Type 2 diabetes mellitus with other specified complication, without long-term current use of insulin ([place])  ?- Plan: POCT glycosylated hemoglobin (Hb A1C) ?-A1c demonstrates good control at  5.7. ? ?Need for vaccination against Streptococcus pneumoniae ?-PCV 20 administered today. ? ?Hypokalemia ? - Plan: Basic metabolic panel ? ?Chronic cough ?Sarcoidosis, with skin manifestations ?-Currently being followed by pulmonology.  She has upcoming appointments with dermatology and ophthalmology. ? ?Hypertension associated with diabetes ([place]) ?-Blood pressure is elevated in office today, however she states home measurements have been within range.  She will do ambulatory blood pressure monitoring and return in 3 months for follow-up. ? ?Hypercholesteremia ?-Check lipids when she returns fasting ? ?Time spent: 38 minutes reviewing chart, interviewing and examining patient, formulating plan of care. ? ? ? ?Patient Instructions  ?-Nice seeing you today!! ? ?-Lab work today; will notify you once results are available. ? ?-Check BP at home. ? ?-Omeprazole 40 mg daily. ? ?-Schedule follow up in 3 months for your physical Come in fasting. ? ?-Contact Dr. Lysbeth Penner office to reschedule your coronary CT. ? ? ? ?Lelon Frohlich, MD ?[facility] ? ?

## 2021-07-09 NOTE — Telephone Encounter (Signed)
[facility] Telephone Encounter ? ?Patient reports had received telephone and mychart message regarding her potassium level. She took K supplements as directed and ate potassium rich foods. She was seen by her new PCP and repeat BMET demonstrated K improved to 3.3. I encouraged to continue to potassium rich foods if she restarts lasix in the future. She expressed appreciate for the call. ?

## 2021-07-10 ENCOUNTER — Ambulatory Visit (INDEPENDENT_AMBULATORY_CARE_PROVIDER_SITE_OTHER)
Admission: RE | Admit: 2021-07-10 | Discharge: 2021-07-10 | Disposition: A | Discharge status: Home / Self Care | Payer: Self-pay | Source: Ambulatory Visit | Attending: Internal Medicine | Admitting: Internal Medicine

## 2021-07-10 ENCOUNTER — Telehealth: Payer: Self-pay | Admitting: Internal Medicine

## 2021-07-10 DIAGNOSIS — E119 Type 2 diabetes mellitus without complications: Secondary | ICD-10-CM | POA: Diagnosis not present

## 2021-07-10 DIAGNOSIS — H35363 Drusen (degenerative) of macula, bilateral: Secondary | ICD-10-CM | POA: Diagnosis not present

## 2021-07-10 DIAGNOSIS — H04123 Dry eye syndrome of bilateral lacrimal glands: Secondary | ICD-10-CM | POA: Diagnosis not present

## 2021-07-10 DIAGNOSIS — D869 Sarcoidosis, unspecified: Secondary | ICD-10-CM | POA: Diagnosis not present

## 2021-07-10 DIAGNOSIS — E7800 Pure hypercholesterolemia, unspecified: Secondary | ICD-10-CM

## 2021-07-10 LAB — HM DIABETES EYE EXAM

## 2021-07-10 NOTE — Telephone Encounter (Signed)
-  Olivia Mackie from [facility] called to report abnormal findings on calcium score. ?-Will forward to ordering physician for review ?

## 2021-07-11 ENCOUNTER — Telehealth: Payer: Self-pay | Admitting: Pulmonary Disease

## 2021-07-11 DIAGNOSIS — D869 Sarcoidosis, unspecified: Secondary | ICD-10-CM

## 2021-07-11 NOTE — Telephone Encounter (Signed)
I attempted to contact patient regarding CT coronary. Left voicemail. ? ?Lung parenchyma demonstrates areas of multifocal GGO and opacities. Of note she was recently ill so this could represent resolving infection however would recommend repeat CT chest without contrast in July 2023 to ensure that this has improved. If not, may need to rule out active sarcoid for her respiratory symptoms of cough. ? ?Please order CT Chest without contrast for mid - July 2023 with follow-up with me after scan. ? ? ?

## 2021-07-12 ENCOUNTER — Other Ambulatory Visit: Payer: Self-pay | Admitting: *Deleted

## 2021-07-12 ENCOUNTER — Telehealth: Payer: Self-pay | Admitting: Internal Medicine

## 2021-07-12 DIAGNOSIS — E7800 Pure hypercholesterolemia, unspecified: Secondary | ICD-10-CM

## 2021-07-12 DIAGNOSIS — R7989 Other specified abnormal findings of blood chemistry: Secondary | ICD-10-CM

## 2021-07-12 DIAGNOSIS — E876 Hypokalemia: Secondary | ICD-10-CM

## 2021-07-12 NOTE — Telephone Encounter (Signed)
MD reviewed results - patient notified via [place] ?

## 2021-07-12 NOTE — Telephone Encounter (Signed)
CT Chest without contrast for mid - July 2023 ordered ?

## 2021-07-12 NOTE — Telephone Encounter (Signed)
Pt would like blood work results and would like callback between 8 am and 830 am ?

## 2021-07-13 MED ORDER — POTASSIUM CHLORIDE CRYS ER 20 MEQ PO TBCR: 20.0000 meq | EXTENDED_RELEASE_TABLET | Freq: Two times a day (BID) | ORAL | 0 refills | Status: DC

## 2021-07-13 NOTE — Telephone Encounter (Signed)
See result note.  ?Orders placed this encounter ?

## 2021-07-14 LAB — PULMONARY FUNCTION TEST
DL/VA % pred: 96 %
DL/VA: 4 ml/min/mmHg/L
DLCO cor % pred: 96 %
DLCO cor: 18.71 ml/min/mmHg
DLCO unc % pred: 100 %
DLCO unc: 19.47 ml/min/mmHg
FEF 25-75 Post: 0.93 L/sec
FEF 25-75 Pre: 0.98 L/sec
FEF2575-%Change-Post: -4 %
FEF2575-%Pred-Post: 54 %
FEF2575-%Pred-Pre: 57 %
FEV1-%Change-Post: 1 %
FEV1-%Pred-Post: 85 %
FEV1-%Pred-Pre: 83 %
FEV1-Post: 1.56 L
FEV1-Pre: 1.54 L
FEV1FVC-%Change-Post: 4 %
FEV1FVC-%Pred-Pre: 89 %
FEV6-%Change-Post: -2 %
FEV6-%Pred-Post: 95 %
FEV6-%Pred-Pre: 97 %
FEV6-Post: 2.16 L
FEV6-Pre: 2.23 L
FEV6FVC-%Pred-Post: 104 %
FEV6FVC-%Pred-Pre: 104 %
FVC-%Change-Post: -2 %
FVC-%Pred-Post: 91 %
FVC-%Pred-Pre: 94 %
FVC-Post: 2.16 L
FVC-Pre: 2.23 L
Post FEV1/FVC ratio: 72 %
Post FEV6/FVC ratio: 100 %
Pre FEV1/FVC ratio: 69 %
Pre FEV6/FVC Ratio: 100 %
RV % pred: 115 %
RV: 2.53 L
TLC % pred: 103 %
TLC: 5.23 L

## 2021-07-16 ENCOUNTER — Encounter: Payer: Self-pay | Admitting: Internal Medicine

## 2021-07-16 DIAGNOSIS — L814 Other melanin hyperpigmentation: Secondary | ICD-10-CM | POA: Diagnosis not present

## 2021-07-16 DIAGNOSIS — L810 Postinflammatory hyperpigmentation: Secondary | ICD-10-CM | POA: Diagnosis not present

## 2021-07-16 DIAGNOSIS — R69 Illness, unspecified: Secondary | ICD-10-CM | POA: Diagnosis not present

## 2021-07-16 DIAGNOSIS — F4320 Adjustment disorder, unspecified: Secondary | ICD-10-CM | POA: Diagnosis not present

## 2021-07-17 ENCOUNTER — Encounter: Payer: Self-pay | Admitting: Internal Medicine

## 2021-07-20 ENCOUNTER — Other Ambulatory Visit (INDEPENDENT_AMBULATORY_CARE_PROVIDER_SITE_OTHER): Payer: Medicare HMO

## 2021-07-20 DIAGNOSIS — E876 Hypokalemia: Secondary | ICD-10-CM | POA: Diagnosis not present

## 2021-07-20 LAB — BASIC METABOLIC PANEL
BUN: 19 mg/dL (ref 6–23)
CO2: 30 mEq/L (ref 19–32)
Calcium: 9.5 mg/dL (ref 8.4–10.5)
Chloride: 103 mEq/L (ref 96–112)
Creatinine, Ser: 0.87 mg/dL (ref 0.40–1.20)
GFR: 67.46 mL/min (ref >60.00)
Glucose, Bld: 77 mg/dL (ref 70–99)
Potassium: 3.6 mEq/L (ref 3.5–5.1)
Sodium: 142 mEq/L (ref 135–145)

## 2021-07-23 ENCOUNTER — Ambulatory Visit: Payer: Medicare HMO | Admitting: Internal Medicine

## 2021-07-24 ENCOUNTER — Ambulatory Visit (INDEPENDENT_AMBULATORY_CARE_PROVIDER_SITE_OTHER): Payer: Medicare HMO | Admitting: Internal Medicine

## 2021-07-24 ENCOUNTER — Encounter: Payer: Self-pay | Admitting: Internal Medicine

## 2021-07-24 VITALS — BP 160/90 | HR 65 | Temp 97.9°F | Ht 64.0 in | Wt 153.0 lb

## 2021-07-24 DIAGNOSIS — K219 Gastro-esophageal reflux disease without esophagitis: Secondary | ICD-10-CM

## 2021-07-24 DIAGNOSIS — R053 Chronic cough: Secondary | ICD-10-CM

## 2021-07-24 MED ORDER — OMEPRAZOLE 40 MG PO CPDR: 40.0000 mg | DELAYED_RELEASE_CAPSULE | Freq: Every day | ORAL | 0 refills | Status: DC

## 2021-07-24 MED ORDER — BENZONATATE 100 MG PO CAPS: 100.0000 mg | ORAL_CAPSULE | Freq: Two times a day (BID) | ORAL | 0 refills | Status: DC | PRN

## 2021-07-24 NOTE — Progress Notes (Signed)
Established Patient Office Visit     CC/Reason for Visit: Discuss cough  HPI: Kristie King is a 71 y.o. female who is coming in today for the above mentioned reasons.  She has had a chronic cough now for months.  She is under the care of pulmonary.  Her recent CT scan showed groundglass opacities.  There is question as to whether her sarcoidosis has reactivated.  She will have a repeat CT scan in July and further work-up to follow.  The last few weeks she was diagnosed with strep throat and feel like her cough has worsened since then.  She has significant postnasal drip.  She is going on vacation and would like to avoid constant coughing.  Past Medical/Surgical History: Past Medical History:  Diagnosis Date   AC (acromioclavicular) arthritis 11/13/2016   Allergy    SEASONAL   Cataract    BILATERAL-REMOVED   Cervical radiculopathy at C6 09/10/2016   CKD stage 2 due to type 2 diabetes mellitus ([place]) 04/14/2018   Diabetes mellitus without complication ([place])    History of lumbar laminectomy 2019   HTN (hypertension) 09/17/2012   Hyperlipidemia    Hyperlipidemia associated with type 2 diabetes mellitus ([place]) 09/17/2012   Hypertension associated with diabetes ([place]) 04/14/2018   Iron deficiency anemia 10/16/2016   Osteoarthritis of spine with radiculopathy, cervical region 08/03/2016   Vitamin D deficiency 10/16/2016    Past Surgical History:  Procedure Laterality Date   BACK SURGERY  03/30/2018   BUNIONECTOMY Right 02/18/2013   BUNIONECTOMY Left 04/01/2013   CATARACTS      Social History:  reports that she quit smoking about 50 years ago. Her smoking use included cigarettes. She has never used smokeless tobacco. She reports current alcohol use. She reports that she does not use drugs.  Allergies: No Known Allergies  Family History:  Family History  Problem Relation Age of Onset   Stroke Mother    Stroke Father    Hyperlipidemia Sister    Diabetes Brother    Hypertension  Brother    Diabetes Sister    Hypertension Sister    Stroke Maternal Grandfather    Colon cancer Neg Hx    Stomach cancer Neg Hx    Esophageal cancer Neg Hx    Pancreatic cancer Neg Hx    Colon polyps Neg Hx    Ulcerative colitis Neg Hx      Current Outpatient Medications:    albuterol (VENTOLIN HFA) 108 (90 Base) MCG/ACT inhaler, Inhale 2 puffs into the lungs every 6 (six) hours as needed for wheezing or shortness of breath., Disp: 8 g, Rfl: 6   benzonatate (TESSALON) 100 MG capsule, Take 1 capsule (100 mg total) by mouth 2 (two) times daily as needed for cough., Disp: 20 capsule, Rfl: 0   ipratropium (ATROVENT) 0.03 % nasal spray, Place 2 sprays into both nostrils every 12 (twelve) hours., Disp: 30 mL, Rfl: 5   traZODone (DESYREL) 50 MG tablet, TAKE 1/2 TO 1 TABLET BY MOUTH AT BEDTIME AS NEEDED FOR SLEEP, Disp: 90 tablet, Rfl: 1   TRULICITY 1.5 UX/3.2TF SOPN, INJECT 0.5 MLS INTO THE SKIN ONCE A WEEK., Disp: 4 mL, Rfl: 3   omeprazole (PRILOSEC) 40 MG capsule, Take 1 capsule (40 mg total) by mouth daily., Disp: 90 capsule, Rfl: 0  Review of Systems:  Constitutional: Denies fever, chills, diaphoresis, appetite change and fatigue.  HEENT: Denies photophobia, eye pain, redness, hearing loss,mouth sores, trouble swallowing, neck pain, neck  stiffness and tinnitus.   Respiratory: Denies SOB, DOE,  chest tightness,  and wheezing.   Cardiovascular: Denies chest pain, palpitations and leg swelling.  Gastrointestinal: Denies nausea, vomiting, abdominal pain, diarrhea, constipation, blood in stool and abdominal distention.  Genitourinary: Denies dysuria, urgency, frequency, hematuria, flank pain and difficulty urinating.  Endocrine: Denies: hot or cold intolerance, sweats, changes in hair or nails, polyuria, polydipsia. Musculoskeletal: Denies myalgias, back pain, joint swelling, arthralgias and gait problem.  Skin: Denies pallor, rash and wound.  Neurological: Denies dizziness, seizures,  syncope, weakness, light-headedness, numbness and headaches.  Hematological: Denies adenopathy. Easy bruising, personal or family bleeding history  Psychiatric/Behavioral: Denies suicidal ideation, mood changes, confusion, nervousness, sleep disturbance and agitation    Physical Exam: Vitals:   07/24/21 0910 07/24/21 0912  BP: 140/80 (!) 160/90  Pulse: 65   Temp: 97.9 F (36.6 C)   TempSrc: Oral   SpO2: 99%   Weight: 153 lb (69.4 kg)   Height: '5\' 4"'$  (1.626 m)     Body mass index is 26.26 kg/m.   Constitutional: NAD, calm, comfortable Eyes: PERRL, lids and conjunctivae normal ENMT: Mucous membranes are moist. Posterior pharynx is erythematous but clear of any exudate or lesions. Normal dentition. Tympanic membrane is pearly white, no erythema or bulging. Respiratory: clear to auscultation bilaterally, no wheezing, no crackles. Normal respiratory effort. No accessory muscle use.  Cardiovascular: Regular rate and rhythm, no murmurs / rubs / gallops. No extremity edema.  Psychiatric: Normal judgment and insight. Alert and oriented x 3. Normal mood.    Impression and Plan:  Chronic cough  - Plan: benzonatate (TESSALON) 100 MG capsule  Gastroesophageal reflux disease, unspecified whether esophagitis present - Plan: omeprazole (PRILOSEC) 40 MG capsule  -Suspect chronic cough related to lung issues that are best discussed with Dr. Loanne Drilling.  Have advised that she start daily antihistamine and Mucinex which might be helpful.  Will also prescribe some Tessalon Perles for her to use while on vacation.  Time spent:22 minutes reviewing chart, interviewing and examining patient and formulating plan of care.      Lelon Frohlich, MD [facility]

## 2021-09-17 ENCOUNTER — Other Ambulatory Visit (HOSPITAL_BASED_OUTPATIENT_CLINIC_OR_DEPARTMENT_OTHER): Payer: Self-pay

## 2021-09-24 ENCOUNTER — Other Ambulatory Visit: Payer: Self-pay | Admitting: Family Medicine

## 2021-09-27 ENCOUNTER — Encounter: Payer: Self-pay | Admitting: Family Medicine

## 2021-09-27 ENCOUNTER — Ambulatory Visit (INDEPENDENT_AMBULATORY_CARE_PROVIDER_SITE_OTHER): Payer: Medicare HMO | Admitting: Family Medicine

## 2021-09-27 VITALS — BP 136/82 | HR 81 | Temp 98.6°F | Ht 64.0 in | Wt 147.8 lb

## 2021-09-27 DIAGNOSIS — D860 Sarcoidosis of lung: Secondary | ICD-10-CM

## 2021-09-27 DIAGNOSIS — J069 Acute upper respiratory infection, unspecified: Secondary | ICD-10-CM

## 2021-09-27 MED ORDER — AZITHROMYCIN 250 MG PO TABS
ORAL_TABLET | ORAL | 0 refills | Status: AC
Start: 2021-09-27 — End: 2021-10-02

## 2021-09-27 NOTE — Assessment & Plan Note (Deleted)
3-4 day history of symptoms, not improved with OTC medications. With her PMH of sarcoidosis and diabetes and I am more concerned about the development of a complicating bacterial infection - I will treat her with a course of zithromax 250 mg tablets. I encouraged her to continue OTC medications to treat symptoms as well. RTC with her PCP as needed.

## 2021-09-27 NOTE — Assessment & Plan Note (Signed)
3-4 day history of symptoms, not improved with OTC medications. With her PMH of sarcoidosis and diabetes and I am more concerned about the development of a complicating bacterial infection - I will treat her with a course of zithromax 250 mg tablets. I encouraged her to continue OTC medications to treat symptoms as well. RTC with her PCP as needed.

## 2021-09-27 NOTE — Progress Notes (Signed)
   Established Patient Office Visit  Subjective   Patient ID: Kristie King, female    DOB: 1950/10/10  Age: 71 y.o. MRN: [id number]  Chief Complaint  Patient presents with   Cough    Productive with yellow sputum x3 days,tried Alka Seltzer Plus with some relief   Fatigue    Patient has new complaint of increasing cough and nasal congestion for 3 days, states she took some OTC medications which helped but made her feel very groggy. States that her cough has not improved. Has had very little energy, sore throat. Losing voice.cough is not very productive. Occasional chills but not fevers noted. No sick contacts that she is aware of. Not on any regular inhalers at this time. States she has a CT scan of her   Cough This is a chronic problem. The current episode started more than 1 year ago.      Review of Systems  Respiratory:  Positive for cough.       Objective:     BP 136/82 (BP Location: Left Arm, Patient Position: Sitting, Cuff Size: Normal)   Pulse 81   Temp 98.6 F (37 C) (Oral)   Ht '5\' 4"'$  (1.626 m)   Wt 147 lb 12.8 oz (67 kg)   SpO2 96%   BMI 25.37 kg/m    Physical Exam HENT:     Right Ear: Hearing and tympanic membrane normal.     Left Ear: Hearing and tympanic membrane normal.  Pulmonary:     Effort: Pulmonary effort is normal.     Breath sounds: Normal breath sounds and air entry.      No results found for any visits on 09/27/21.    The ASCVD Risk score (Arnett DK, et al., 2019) failed to calculate for the following reasons:   The valid HDL cholesterol range is 20 to 100 mg/dL   The valid total cholesterol range is 130 to 320 mg/dL    Assessment & Plan:   Problem List Items Addressed This Visit       Respiratory   Sarcoidosis of lung ([place]) - Primary   Acute upper respiratory infection    3-4 day history of symptoms, not improved with OTC medications. With her PMH of sarcoidosis and diabetes and I am more concerned about the development of a  complicating bacterial infection - I will treat her with a course of zithromax 250 mg tablets. I encouraged her to continue OTC medications to treat symptoms as well. RTC with her PCP as needed.      Relevant Medications   azithromycin (ZITHROMAX) 250 MG tablet    No follow-ups on file.    Farrel Conners, MD

## 2021-09-28 ENCOUNTER — Ambulatory Visit (HOSPITAL_COMMUNITY)
Admission: RE | Admit: 2021-09-28 | Discharge: 2021-09-28 | Disposition: A | Discharge status: Home / Self Care | Payer: Medicare HMO | Source: Ambulatory Visit | Attending: Pulmonary Disease | Admitting: Pulmonary Disease

## 2021-09-28 ENCOUNTER — Encounter (HOSPITAL_COMMUNITY): Payer: Self-pay

## 2021-09-28 ENCOUNTER — Other Ambulatory Visit (HOSPITAL_COMMUNITY)
Admission: RE | Admit: 2021-09-28 | Discharge: 2021-09-28 | Disposition: A | Discharge status: Home / Self Care | Payer: Medicare HMO | Source: Ambulatory Visit

## 2021-09-28 DIAGNOSIS — E7800 Pure hypercholesterolemia, unspecified: Secondary | ICD-10-CM | POA: Diagnosis not present

## 2021-09-28 DIAGNOSIS — D869 Sarcoidosis, unspecified: Secondary | ICD-10-CM | POA: Diagnosis not present

## 2021-09-28 DIAGNOSIS — R7989 Other specified abnormal findings of blood chemistry: Secondary | ICD-10-CM | POA: Diagnosis not present

## 2021-09-28 DIAGNOSIS — I2510 Atherosclerotic heart disease of native coronary artery without angina pectoris: Secondary | ICD-10-CM | POA: Insufficient documentation

## 2021-09-28 DIAGNOSIS — R911 Solitary pulmonary nodule: Secondary | ICD-10-CM | POA: Diagnosis not present

## 2021-09-28 DIAGNOSIS — R918 Other nonspecific abnormal finding of lung field: Secondary | ICD-10-CM | POA: Diagnosis not present

## 2021-09-28 DIAGNOSIS — I700 Atherosclerosis of aorta: Secondary | ICD-10-CM | POA: Diagnosis not present

## 2021-09-28 LAB — LDL CHOLESTEROL, DIRECT: LDL Direct: 130 mg/dL — ABNORMAL HIGH (ref 0–99)

## 2021-09-29 LAB — NMR, LIPOPROFILE
Cholesterol, Total: 299 mg/dL — ABNORMAL HIGH (ref 100–199)
HDL Particle Number: 34.5 umol/L (ref >=30.5)
HDL-C: 136 mg/dL (ref >39)
LDL Particle Number: 1050 nmol/L — ABNORMAL HIGH (ref <1000)
LDL Size: 21.5 nm (ref >20.5)
LDL-C (NIH Calc): 155 mg/dL — ABNORMAL HIGH (ref 0–99)
LP-IR Score: <25 (ref <=45)
Small LDL Particle Number: <90 nmol/L (ref <=527)
Triglycerides: 59 mg/dL (ref 0–149)

## 2021-10-04 ENCOUNTER — Telehealth: Payer: Self-pay | Admitting: Internal Medicine

## 2021-10-04 ENCOUNTER — Other Ambulatory Visit: Payer: Self-pay | Admitting: Family Medicine

## 2021-10-04 MED ORDER — TRULICITY 1.5 MG/0.5ML ~~LOC~~ SOAJ: SUBCUTANEOUS | 3 refills | Status: DC

## 2021-10-04 NOTE — Telephone Encounter (Signed)
Rx sent 

## 2021-10-04 NOTE — Telephone Encounter (Signed)
Requesting a refill TRULICITY 1.5 GF/8.4KJ SOPN   [id number] [address]:  [phone] Fax:  [phone]

## 2021-10-08 ENCOUNTER — Ambulatory Visit: Payer: Medicare HMO | Admitting: Pulmonary Disease

## 2021-10-08 ENCOUNTER — Encounter: Payer: Self-pay | Admitting: Pulmonary Disease

## 2021-10-08 ENCOUNTER — Encounter: Payer: Self-pay | Admitting: Pharmacist

## 2021-10-08 VITALS — BP 126/74 | HR 60 | Temp 97.8°F | Ht 64.0 in | Wt 147.6 lb

## 2021-10-08 DIAGNOSIS — D860 Sarcoidosis of lung: Secondary | ICD-10-CM | POA: Diagnosis not present

## 2021-10-08 DIAGNOSIS — Z9189 Other specified personal risk factors, not elsewhere classified: Secondary | ICD-10-CM

## 2021-10-08 MED ORDER — PROMETHAZINE-DM 6.25-15 MG/5ML PO SYRP: 5.0000 mL | ORAL_SOLUTION | Freq: Four times a day (QID) | ORAL | 2 refills | Status: DC | PRN

## 2021-10-08 MED ORDER — ALBUTEROL SULFATE HFA 108 (90 BASE) MCG/ACT IN AERS: 2.0000 | INHALATION_SPRAY | Freq: Four times a day (QID) | RESPIRATORY_TRACT | 2 refills | Status: DC | PRN

## 2021-10-08 NOTE — Progress Notes (Signed)
Subjective:   PATIENT ID: Kristie King GENDER: female DOB: 1950/04/28, MRN: [id number]   HPI  Chief Complaint  Patient presents with   Follow-up    Pt states she is doing better compared to last visit. States she had a cough about 2 weeks ago and was prescribed abx. States she is still coughing getting up green phlegm.    Reason for Visit: Follow-up  [name] is 71 year old female with sarcoidosis, arthritis, HTN, DM2, HLD who presents for follow-up.  Initial consult She intially presented with facial and arm lesions and diagnosed via skin biopsy in the 1970s. She was treated with high dose steroids and methotrexate in the 80s. She was previously followed by Pulmonary at [facility]. Last note in 10/22/12 by [facility] and tapered off steroids. Note comments that she had sarcoid involving lung, skin, sinus and larynx. She was seen by Dr. Vanessa Kick in Dermatology in 2016 and had received injections for facial lesions.  She reports cough for the last 8-9 months. She was seen by GI. Scheduled for ENT next month. Seen by PCP and advised to hold lisinopril for her hypertension. Has started some reflux medication with help in cough. Reports nasal congestion  07/06/21 She recently returned from vacation. Diagnosed with strep throat and started on amoxicillin. She reports cough before this illness remains persistent. When she held her lisinopril but no appreciable difference. Uses flonase nightly. However had nose bleeds x 2. Stopped omeprazole after 10 days   10/08/21 Last month she was treated with Z pack for acute respiratory infection. She has productive cough that has persisted for the last two weeks. No wheezing or shortness of breath. Prior to her illness she felt her cough was doing better and even self discontinued nasal sprays and reflux meds.  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  Past Medical History:  Diagnosis Date   AC  (acromioclavicular) arthritis 11/13/2016   Allergy    SEASONAL   Cataract    BILATERAL-REMOVED   Cervical radiculopathy at C6 09/10/2016   CKD stage 2 due to type 2 diabetes mellitus ([place]) 04/14/2018   Diabetes mellitus without complication (HCC)    History of lumbar laminectomy 2019   HTN (hypertension) 09/17/2012   Hyperlipidemia    Hyperlipidemia associated with type 2 diabetes mellitus ([place]) 09/17/2012   Hypertension associated with diabetes ([place]) 04/14/2018   Iron deficiency anemia 10/16/2016   Osteoarthritis of spine with radiculopathy, cervical region 08/03/2016   Vitamin D deficiency 10/16/2016     Family History  Problem Relation Age of Onset   Stroke Mother    Stroke Father    Hyperlipidemia Sister    Diabetes Brother    Hypertension Brother    Diabetes Sister    Hypertension Sister    Stroke Maternal Grandfather    Colon cancer Neg Hx    Stomach cancer Neg Hx    Esophageal cancer Neg Hx    Pancreatic cancer Neg Hx    Colon polyps Neg Hx    Ulcerative colitis Neg Hx      Social History   Occupational History   Occupation: Retired    Fish farm manager: [employer]  Tobacco Use   Smoking status: Former    Years: 10.00    Types: Cigarettes    Quit date: 01/15/1971    Years since quitting: 50.7   Smokeless tobacco: Never  Vaping Use   Vaping Use: Never used  Substance and Sexual  Activity   Alcohol use: Yes    Comment: 09/17/2012 "drink on special occasions only"   Drug use: No   Sexual activity: Yes    Partners: Male    No Known Allergies   Outpatient Medications Prior to Visit  Medication Sig Dispense Refill   Dulaglutide (TRULICITY) 1.5 KR/8.3KF SOPN INJECT 0.5 MLS INTO THE SKIN ONCE A WEEK. 4 mL 3   traZODone (DESYREL) 50 MG tablet TAKE 1/2 TO 1 TABLET BY MOUTH AT BEDTIME AS NEEDED FOR SLEEP 90 tablet 1   No facility-administered medications prior to visit.    Review of Systems  Constitutional:  Negative for chills, diaphoresis, fever, malaise/fatigue and weight  loss.  HENT:  Negative for congestion.   Respiratory:  Positive for cough. Negative for hemoptysis, sputum production, shortness of breath and wheezing.   Cardiovascular:  Negative for chest pain, palpitations and leg swelling.     Objective:   Vitals:   10/08/21 0910  BP: 126/74  Pulse: 60  Temp: 97.8 F (36.6 C)  TempSrc: Oral  SpO2: 100%  Weight: 67 kg  Height: _0  (1.626 m)   SpO2: 100 % (RA) O2 Device: None (Room air)  Physical Exam: General: Well-appearing, no acute distress HENT: [place], AT Eyes: EOMI, no scleral icterus Respiratory: Clear to auscultation bilaterally.  No crackles, wheezing or rales Cardiovascular: RRR, -M/R/G, no JVD Extremities:-Edema,-tenderness Neuro: AAO x4, CNII-XII grossly intact Psych: Normal mood, normal affect  Data Reviewed:  Imaging: CXR 05/06/21 - No infiltrate, effusion or edema CT Chest 09/28/21 - Nearly reoslved GGO bilaterally in peribronchovascular distribution that is less pronounced. Perilymphatic nodularities with largest in LUL. Marland Kitchen  PFT: 07/06/21 FVC 2.16 (91%) FEV1 1.56 (85%) Ratio 69  TLC 103% DLCO 100% Interpretation: Normal PFTs. No obstructive or restrictive defect. No significant bronchodilator response however does not preclude benefit of inhalers.    Labs:    Latest Ref Rng & Units 05/11/2021    9:15 AM 04/26/2021   12:04 PM 04/06/2021    4:27 PM  CBC  WBC 4.0 - 10.5 K/uL 7.4  5.5  4.9   Hemoglobin 12.0 - 15.0 g/dL 11.3  11.7  11.9   Hematocrit 36.0 - 46.0 % 34.5  35.8  36.3   Platelets 150.0 - 400.0 K/uL 305.0  272.0  271   Stable anemia      Latest Ref Rng & Units 07/20/2021    9:27 AM 07/09/2021    1:48 PM 07/06/2021    2:15 PM  BMP  Glucose 70 - 99 mg/dL 77  110  167   BUN 6 - 23 mg/dL _1 Creatinine 0.40 - 1.20 mg/dL 0.87  0.84  0.94   Sodium 135 - 145 mEq/L 142  137  137   Potassium 3.5 - 5.1 mEq/L 3.6  3.3  2.8   Chloride 96 - 112 mEq/L 103  103  95   CO2 19 - 32 mEq/L 30  26  33   Calcium 8.4  - 10.5 mg/dL 9.5  9.4  9.7   Hypokalemia resolved Cr normalized      Latest Ref Rng & Units 04/26/2021   12:04 PM 11/10/2020   11:47 AM 05/18/2020    1:41 PM  Hepatic Function  Total Protein 6.0 - 8.3 g/dL 7.1  6.8  7.2   Albumin 3.5 - 5.2 g/dL 4.4  3.9  4.3   AST 0 - 37 U/L _2 ALT  0 - 35 U/L _0 Alk Phosphatase 39 - 117 U/L 52  49  51   Total Bilirubin 0.2 - 1.2 mg/dL 0.7  0.6  0.6   Normal LFTs  Absolute eos 04/26/21 - 100     Assessment & Plan:   Discussion: 71 year old female with sarcoidosis, arthritis, HTN, DM2, HLD who presents for follow-up. Currently with post-viral cough <8 weeks. May have prolonged respiratory symptoms due to underlying sarcoid. Previously with improved symptoms prior to this illness.  We discussed the clinical course of sarcoid and management including serial PFTs, labs, eye exam, and EKG and chest imaging if indicated. If symptoms suggest sarcoid flare in the future, we would manage with steroids +/- biologics.  Pulmonary sarcoidosis --Dx in 1970s via skin bx --Annual ophthalmology exam. Due 2024 --Scheduled for Dermatology next visit. --Labs q6 months  Sarcoid Monitoring --Recent chest imaging reviewed. Overall improved from acute findings --Annual PFTs. Due in 18-24 months --Annual ophthalmology exam. UTD 2023  Chronic cough Normal PFTs --START albuterol TWO puffs in  morning and at night  --RESTART atrovent 1 spray per nostril every night if nasal congestion recurs --Allergies: CONTINUE Zyrtec (cetrizine) 10 mg once a day. Buy over-the counter --Anti-reflux trial did not help   Health Maintenance Immunization History  Administered Date(s) Administered   Fluad Quad(high Dose 65+) 11/11/2018, 11/10/2020   Influenza, High Dose Seasonal PF 12/12/2017   Influenza,inj,Quad PF,6+ Mos 03/09/2015   Influenza-Unspecified 02/07/2017   PFIZER(Purple Top)SARS-COV-2 Vaccination 04/08/2019, 04/29/2019, 11/30/2019, 01/09/2021    PNEUMOCOCCAL CONJUGATE-20 07/09/2021   Pneumococcal Polysaccharide-23 05/18/2020   CT Lung Screen - not qualified. Quit smoking >50 lbs  No orders of the defined types were placed in this encounter.  Meds ordered this encounter  Medications   promethazine-dextromethorphan (PROMETHAZINE-DM) 6.25-15 MG/5ML syrup    Sig: Take 5 mLs by mouth 4 (four) times daily as needed for cough.    Dispense:  180 mL    Refill:  2   albuterol (VENTOLIN HFA) 108 (90 Base) MCG/ACT inhaler    Sig: Inhale 2 puffs into the lungs every 6 (six) hours as needed for wheezing or shortness of breath.    Dispense:  8 g    Refill:  2    Return in about 6 months (around 04/10/2022).   I have spent a total time of 35-minutes on the day of the appointment including chart review, data review, collecting history, coordinating care and discussing medical diagnosis and plan with the patient/family. Past medical history, allergies, medications were reviewed. Pertinent imaging, labs and tests included in this note have been reviewed and interpreted independently by me  [place], MD [facility] 10/08/2021 9:59 AM  Office Number [phone]

## 2021-10-08 NOTE — Progress Notes (Signed)
[facility])                                            [place] Team                                        Statin Quality Measure Assessment    10/08/2021  Kristie King 09-Apr-1950 [id number]    Per review of chart and payor information, patient has a diagnosis of diabetes but is not currently filling a statin prescription.  This places patient into the SUPD (Statin Use In Patients with Diabetes) measure for CMS.    Patient was previously on Rosuvastatin and it was discontinued due to patient preference. It is not clear if patient experienced statin intolerance. She has an upcoming appointment with her PCP for follow up on 10/09/21.  If deemed therapeutically appropriate, statin therapy could be assessed at the upcoming visit. If patient experienced statin intolerance, a statin exclusion code could be associated with the visit which would remove the patient from the measure.  The ASCVD Risk score (Arnett DK, et al., 2019) failed to calculate for the following reasons:   The valid HDL cholesterol range is 20 to 100 mg/dL 05/18/2020     Component Value Date/Time   CHOL 300 (H) 05/18/2020 1341   TRIG 49.0 05/18/2020 1341   HDL 160.00 05/18/2020 1341   CHOLHDL 2 05/18/2020 1341   VLDL 9.8 05/18/2020 1341   LDLCALC 131 (H) 05/18/2020 1341   LDLDIRECT 130 (H) 09/28/2021 1133    Please consider ONE of the following recommendations:  Initiate high intensity statin Atorvastatin '40mg'$  once daily, #90, 3 refills   Rosuvastatin '20mg'$  once daily, #90, 3 refills    Initiate moderate intensity          statin with reduced frequency if prior          statin intolerance 1x weekly, #13, 3 refills   2x weekly, #26, 3 refills   3x weekly, #39, 3 refills    Code for past statin intolerance or  other exclusions (required annually)   Provider Requirements:  Associate code during an office visit or telehealth encounter  Drug Induced  Myopathy G72.0   Myopathy, unspecified G72.9   Myositis, unspecified M60.9   Rhabdomyolysis J88.41   Alcoholic fatty liver Y60.6   Cirrhosis of liver K74.69   Prediabetes R73.03   PCOS E28.2   Toxic liver disease, unspecified K71.9        Plan: Route note to PCP prior to appointment.  Elayne Guerin, PharmD, [place] Clinical Pharmacist [phone]

## 2021-10-08 NOTE — Patient Instructions (Signed)
Pulmonary sarcoidosis --Dx in 1970s via skin bx --Annual ophthalmology exam. Due 2024 --Scheduled for Dermatology next visit. --Labs q6 months  Sarcoid Monitoring --Recent chest imaging reviewed. Overall improved from acute findings --Annual PFTs. Due in 18-24 months --Annual ophthalmology exam. UTD 2023  Chronic cough Normal PFTs --START albuterol TWO puffs in  morning and at night  --RESTART atrovent 1 spray per nostril every night if nasal congestion recurs --Allergies: CONTINUE Zyrtec (cetrizine) 10 mg once a day. Buy over-the counter --Anti-reflux trial did not help  Follow-up with me in 6 months

## 2021-10-09 ENCOUNTER — Telehealth: Payer: Self-pay | Admitting: Internal Medicine

## 2021-10-09 ENCOUNTER — Encounter: Payer: Self-pay | Admitting: Internal Medicine

## 2021-10-09 ENCOUNTER — Other Ambulatory Visit: Payer: Self-pay | Admitting: *Deleted

## 2021-10-09 ENCOUNTER — Ambulatory Visit (INDEPENDENT_AMBULATORY_CARE_PROVIDER_SITE_OTHER): Payer: Medicare HMO | Admitting: Internal Medicine

## 2021-10-09 ENCOUNTER — Other Ambulatory Visit: Payer: Self-pay | Admitting: Internal Medicine

## 2021-10-09 VITALS — BP 170/100 | HR 95 | Temp 97.8°F | Ht 63.5 in | Wt 148.6 lb

## 2021-10-09 DIAGNOSIS — E559 Vitamin D deficiency, unspecified: Secondary | ICD-10-CM

## 2021-10-09 DIAGNOSIS — Z13820 Encounter for screening for osteoporosis: Secondary | ICD-10-CM | POA: Diagnosis not present

## 2021-10-09 DIAGNOSIS — D860 Sarcoidosis of lung: Secondary | ICD-10-CM

## 2021-10-09 DIAGNOSIS — E1159 Type 2 diabetes mellitus with other circulatory complications: Secondary | ICD-10-CM

## 2021-10-09 DIAGNOSIS — E7800 Pure hypercholesterolemia, unspecified: Secondary | ICD-10-CM

## 2021-10-09 DIAGNOSIS — I10 Essential (primary) hypertension: Secondary | ICD-10-CM | POA: Diagnosis not present

## 2021-10-09 DIAGNOSIS — Z780 Asymptomatic menopausal state: Secondary | ICD-10-CM

## 2021-10-09 DIAGNOSIS — E1169 Type 2 diabetes mellitus with other specified complication: Secondary | ICD-10-CM

## 2021-10-09 DIAGNOSIS — Z0000 Encounter for general adult medical examination without abnormal findings: Secondary | ICD-10-CM | POA: Diagnosis not present

## 2021-10-09 LAB — MICROALBUMIN / CREATININE URINE RATIO
Creatinine,U: 59.7 mg/dL
Microalb Creat Ratio: 1.2 mg/g (ref 0.0–30.0)
Microalb, Ur: <0.7 mg/dL (ref 0.0–1.9)

## 2021-10-09 LAB — COMPREHENSIVE METABOLIC PANEL
ALT: 6 U/L (ref 0–35)
AST: 20 U/L (ref 0–37)
Albumin: 4.1 g/dL (ref 3.5–5.2)
Alkaline Phosphatase: 47 U/L (ref 39–117)
BUN: 25 mg/dL — ABNORMAL HIGH (ref 6–23)
CO2: 27 mEq/L (ref 19–32)
Calcium: 9.4 mg/dL (ref 8.4–10.5)
Chloride: 104 mEq/L (ref 96–112)
Creatinine, Ser: 0.83 mg/dL (ref 0.40–1.20)
GFR: 71.27 mL/min (ref >60.00)
Glucose, Bld: 96 mg/dL (ref 70–99)
Potassium: 3.5 mEq/L (ref 3.5–5.1)
Sodium: 140 mEq/L (ref 135–145)
Total Bilirubin: 0.6 mg/dL (ref 0.2–1.2)
Total Protein: 7 g/dL (ref 6.0–8.3)

## 2021-10-09 LAB — CBC WITH DIFFERENTIAL/PLATELET
Basophils Absolute: 0 10*3/uL (ref 0.0–0.1)
Basophils Relative: 0.6 % (ref 0.0–3.0)
Eosinophils Absolute: 0.1 10*3/uL (ref 0.0–0.7)
Eosinophils Relative: 1.8 % (ref 0.0–5.0)
HCT: 34.5 % — ABNORMAL LOW (ref 36.0–46.0)
Hemoglobin: 11.4 g/dL — ABNORMAL LOW (ref 12.0–15.0)
Lymphocytes Relative: 34 % (ref 12.0–46.0)
Lymphs Abs: 2.3 10*3/uL (ref 0.7–4.0)
MCHC: 33.1 g/dL (ref 30.0–36.0)
MCV: 86.8 fl (ref 78.0–100.0)
Monocytes Absolute: 0.8 10*3/uL (ref 0.1–1.0)
Monocytes Relative: 12.2 % — ABNORMAL HIGH (ref 3.0–12.0)
Neutro Abs: 3.4 10*3/uL (ref 1.4–7.7)
Neutrophils Relative %: 51.4 % (ref 43.0–77.0)
Platelets: 326 10*3/uL (ref 150.0–400.0)
RBC: 3.97 Mil/uL (ref 3.87–5.11)
RDW: 13.7 % (ref 11.5–15.5)
WBC: 6.6 10*3/uL (ref 4.0–10.5)

## 2021-10-09 LAB — LIPID PANEL
Cholesterol: 277 mg/dL — ABNORMAL HIGH (ref 0–200)
HDL: 126.7 mg/dL (ref >39.00)
LDL Cholesterol: 142 mg/dL — ABNORMAL HIGH (ref 0–99)
NonHDL: 149.85
Total CHOL/HDL Ratio: 2
Triglycerides: 40 mg/dL (ref 0.0–149.0)
VLDL: 8 mg/dL (ref 0.0–40.0)

## 2021-10-09 LAB — TSH: TSH: 1.12 u[IU]/mL (ref 0.35–5.50)

## 2021-10-09 LAB — HEMOGLOBIN A1C: Hgb A1c MFr Bld: 6.6 % — ABNORMAL HIGH (ref 4.6–6.5)

## 2021-10-09 LAB — VITAMIN D 25 HYDROXY (VIT D DEFICIENCY, FRACTURES): VITD: 12.36 ng/mL — ABNORMAL LOW (ref 30.00–100.00)

## 2021-10-09 LAB — VITAMIN B12: Vitamin B-12: 384 pg/mL (ref 211–911)

## 2021-10-09 MED ORDER — VITAMIN D (ERGOCALCIFEROL) 1.25 MG (50000 UNIT) PO CAPS: 50000.0000 [IU] | ORAL_CAPSULE | ORAL | 0 refills | Status: AC

## 2021-10-09 MED ORDER — ATORVASTATIN CALCIUM 40 MG PO TABS: 40.0000 mg | ORAL_TABLET | Freq: Every day | ORAL | 1 refills | Status: DC

## 2021-10-09 MED ORDER — AMLODIPINE BESYLATE 5 MG PO TABS: 5.0000 mg | ORAL_TABLET | Freq: Every day | ORAL | 1 refills | Status: DC

## 2021-10-09 NOTE — Patient Instructions (Signed)
-  Nice seeing you today!!  -Lab work today; will notify you once results are available.  -Remember your tdap and shingles vaccines.  -Start atorvastatin 40 mg daily for cholesterol.  -Start amlodipine 5 mg daily for blood pressure.  -Schedule follow up with your GYN.  -Schedule follow up with me in 3 months.

## 2021-10-09 NOTE — Progress Notes (Signed)
Established Patient Office Visit     CC/Reason for Visit: Annual preventive exam and subsequent Medicare wellness visit  HPI: Kristie King is a 71 y.o. female who is coming in today for the above mentioned reasons. Past Medical History is significant for: Hypertension, hyperlipidemia, type 2 diabetes, sarcoidosis followed by pulmonologist Dr. Loanne Drilling.  She is feeling well today.  Her cough has improved.  She has routine eye and dental care.  No perceived hearing difficulty.  She is now due for a bone density and GYN exam.  She is due for shingles and Tdap vaccines.  Blood pressure is noted to be elevated today.  On recent labs she was noted to have elevated cholesterol and is not currently on medication.  When she went to pick up her Trulicity her co-pay was $600 so she is not taking it.   Past Medical/Surgical History: Past Medical History:  Diagnosis Date   AC (acromioclavicular) arthritis 11/13/2016   Allergy    SEASONAL   Cataract    BILATERAL-REMOVED   Cervical radiculopathy at C6 09/10/2016   CKD stage 2 due to type 2 diabetes mellitus ([place]) 04/14/2018   Diabetes mellitus without complication ([place])    History of lumbar laminectomy 2019   HTN (hypertension) 09/17/2012   Hyperlipidemia    Hyperlipidemia associated with type 2 diabetes mellitus ([place]) 09/17/2012   Hypertension associated with diabetes ([place]) 04/14/2018   Iron deficiency anemia 10/16/2016   Osteoarthritis of spine with radiculopathy, cervical region 08/03/2016   Vitamin D deficiency 10/16/2016    Past Surgical History:  Procedure Laterality Date   BACK SURGERY  03/30/2018   BUNIONECTOMY Right 02/18/2013   BUNIONECTOMY Left 04/01/2013   CATARACTS      Social History:  reports that she quit smoking about 50 years ago. Her smoking use included cigarettes. She has never used smokeless tobacco. She reports current alcohol use. She reports that she does not use drugs.  Allergies: No Known Allergies  Family  History:  Family History  Problem Relation Age of Onset   Stroke Mother    Stroke Father    Hyperlipidemia Sister    Diabetes Brother    Hypertension Brother    Diabetes Sister    Hypertension Sister    Stroke Maternal Grandfather    Colon cancer Neg Hx    Stomach cancer Neg Hx    Esophageal cancer Neg Hx    Pancreatic cancer Neg Hx    Colon polyps Neg Hx    Ulcerative colitis Neg Hx      Current Outpatient Medications:    albuterol (VENTOLIN HFA) 108 (90 Base) MCG/ACT inhaler, Inhale 2 puffs into the lungs every 6 (six) hours as needed for wheezing or shortness of breath., Disp: 8 g, Rfl: 2   amLODipine (NORVASC) 5 MG tablet, Take 1 tablet (5 mg total) by mouth daily., Disp: 90 tablet, Rfl: 1   atorvastatin (LIPITOR) 40 MG tablet, Take 1 tablet (40 mg total) by mouth daily., Disp: 90 tablet, Rfl: 1   Dulaglutide (TRULICITY) 1.5 KX/3.8HW SOPN, INJECT 0.5 MLS INTO THE SKIN ONCE A WEEK., Disp: 4 mL, Rfl: 3   promethazine-dextromethorphan (PROMETHAZINE-DM) 6.25-15 MG/5ML syrup, Take 5 mLs by mouth 4 (four) times daily as needed for cough., Disp: 180 mL, Rfl: 2   traZODone (DESYREL) 50 MG tablet, TAKE 1/2 TO 1 TABLET BY MOUTH AT BEDTIME AS NEEDED FOR SLEEP, Disp: 90 tablet, Rfl: 1  Review of Systems:  Constitutional: Denies fever, chills, diaphoresis,  appetite change and fatigue.  HEENT: Denies photophobia, eye pain, redness, hearing loss, ear pain, congestion, sore throat, rhinorrhea, sneezing, mouth sores, trouble swallowing, neck pain, neck stiffness and tinnitus.   Respiratory: Denies SOB, DOE, cough, chest tightness,  and wheezing.   Cardiovascular: Denies chest pain, palpitations and leg swelling.  Gastrointestinal: Denies nausea, vomiting, abdominal pain, diarrhea, constipation, blood in stool and abdominal distention.  Genitourinary: Denies dysuria, urgency, frequency, hematuria, flank pain and difficulty urinating.  Endocrine: Denies: hot or cold intolerance, sweats, changes  in hair or nails, polyuria, polydipsia. Musculoskeletal: Denies myalgias, back pain, joint swelling, arthralgias and gait problem.  Skin: Denies pallor, rash and wound.  Neurological: Denies dizziness, seizures, syncope, weakness, light-headedness, numbness and headaches.  Hematological: Denies adenopathy. Easy bruising, personal or family bleeding history  Psychiatric/Behavioral: Denies suicidal ideation, mood changes, confusion, nervousness, sleep disturbance and agitation    Physical Exam: Vitals:   10/09/21 0808 10/09/21 0812  BP: (!) 150/80 (!) 170/100  Pulse: 95   Temp: 97.8 F (36.6 C)   TempSrc: Oral   SpO2: 100%   Weight: 148 lb 9.6 oz (67.4 kg)   Height: 5' 3.5" (1.613 m)     Body mass index is 25.91 kg/m.   Constitutional: NAD, calm, comfortable Eyes: PERRL, lids and conjunctivae normal ENMT: Mucous membranes are moist. Posterior pharynx clear of any exudate or lesions. Normal dentition. Tympanic membrane is pearly white, no erythema or bulging. Neck: normal, supple, no masses, no thyromegaly Respiratory: clear to auscultation bilaterally, no wheezing, no crackles. Normal respiratory effort. No accessory muscle use.  Cardiovascular: Regular rate and rhythm, no murmurs / rubs / gallops. No extremity edema. 2+ pedal pulses. No carotid bruits.  Abdomen: no tenderness, no masses palpated. No hepatosplenomegaly. Bowel sounds positive.  Musculoskeletal: no clubbing / cyanosis. No joint deformity upper and lower extremities. Good ROM, no contractures. Normal muscle tone.  Skin: no rashes, lesions, ulcers. No induration Neurologic: CN 2-12 grossly intact. Sensation intact, DTR normal. Strength 5/5 in all 4.  Psychiatric: Normal judgment and insight. Alert and oriented x 3. Normal mood.    Subsequent Medicare wellness visit   1. Risk factors, based on past  M,S,F -cardiovascular disease risk factors include age, history of hypertension and hyperlipidemia   2.  Physical  activities:  remains physically active   3.  Depression/mood: Stable, not depressed   4.  Hearing: No perceived issues   5.  ADL's: Independent in all ADLs   6.  Fall risk: Low fall risk   7.  Home safety: No problems identified   8.  Height weight, and visual acuity: height and weight as above, vision:  Vision Screening   Right eye Left eye Both eyes  Without correction '20/25 20/40 20/25 '$  With correction        9.  Counseling: Advised to update vaccination status, schedule GYN and DEXA scans   10. Lab orders based on risk factors: Laboratory update will be reviewed   11. Referral : GYN, DEXA   12. Care plan: Follow-up with me in 3 months   13. Cognitive assessment: No cognitive impairment   14. Screening: Patient provided with a written and personalized 5-10 year screening schedule in the AVS. yes   15. Provider List Update: PCP, pulmonary Dr. Loanne Drilling  16. Advance Directives: Full code   17. Opioids: Patient is not on any opioid prescriptions and has no risk factors for a substance use disorder.   [place] Office Visit from 10/09/2021 in [place]  [facility] at [facility]  PHQ-9 Total Score 0          04/06/2021    2:57 PM 05/06/2021    9:21 AM 07/09/2021    1:45 PM 07/23/2021    9:12 AM 10/09/2021    8:13 AM  Fall Risk  Falls in the past year?   0 0 0  Was there an injury with Fall?   0  0  Fall Risk Category Calculator   0  0  Fall Risk Category   Low  Low  Patient Fall Risk Level Low fall risk Low fall risk Low fall risk  Low fall risk  Patient at Risk for Falls Due to   No Fall Risks  No Fall Risks  Fall risk Follow up   Falls evaluation completed  Falls evaluation completed     Impression and Plan:  Encounter for preventive health examination -Recommend routine eye and dental care. -Immunizations: She will get updated Tdap and shingles at pharmacy -Healthy lifestyle discussed in detail. -Labs to be updated today. -Colon cancer screening:  09/2019 -Breast cancer screening: 02/2021 -Cervical cancer screening: Overdue, referred to GYN -Lung cancer screening: Not applicable -Prostate cancer screening: Not applicable -DEXA: Due, referral placed  Hypertension associated with diabetes ([place])  - Plan: CBC with Differential/Platelet, Comprehensive metabolic panel, amLODipine (NORVASC) 5 MG tablet -Blood pressure is elevated, start amlodipine 5 mg, return in 3 months for follow-up.  Hypercholesteremia  - Plan: Lipid panel, atorvastatin (LIPITOR) 40 MG tablet -Goal LDL less than 70, at last check was 155.  Start atorvastatin 40 mg daily and recheck lipids in 3 months.  Type 2 diabetes mellitus with other specified complication, without long-term current use of insulin ([place])  - Plan: Hemoglobin A1c, Microalbumin / creatinine urine ratio -Eye exam is up-to-date. -She will contact her insurance company to see which GLP-1 has better coverage.  Sarcoidosis of lung ([place]) - Plan: TSH, Vitamin B12, VITAMIN D 25 Hydroxy (Vit-D Deficiency, Fractures) -Followed by pulmonary.     Patient Instructions  -Nice seeing you today!!  -Lab work today; will notify you once results are available.  -Remember your tdap and shingles vaccines.  -Start atorvastatin 40 mg daily for cholesterol.  -Start amlodipine 5 mg daily for blood pressure.  -Schedule follow up with your GYN.  -Schedule follow up with me in 3 months.      Lelon Frohlich, MD [facility]

## 2021-10-10 NOTE — Telephone Encounter (Signed)
Error. Please disregard

## 2021-10-22 ENCOUNTER — Telehealth: Payer: Self-pay | Admitting: Pharmacist

## 2021-10-22 DIAGNOSIS — L810 Postinflammatory hyperpigmentation: Secondary | ICD-10-CM | POA: Diagnosis not present

## 2021-10-22 NOTE — Progress Notes (Signed)
[place] [facility])  [place] Team    10/22/2021  Kristie King 01-Dec-1950 [id number]  Reason for referral:  2022 Medication Adherence Fails Report   Referral source:  [facility] Medication Adherence CPhT  Current insurance: [employer]  PMHx includes but not limited to:  DMT2  Outreach:  Unsuccessful telephone call with Wardell Hon[place].  HIPAA compliant voice mail left for patient to return my call.   Subjective:  On review of notes in EMR, patient may be in need of patient assistance for Trulicity.   Objective: The ASCVD Risk score (Arnett DK, et al., 2019) failed to calculate for the following reasons:   The valid HDL cholesterol range is 20 to 100 mg/dL  Lab Results  Component Value Date   CREATININE 0.83 10/09/2021   CREATININE 0.87 07/20/2021   CREATININE 0.84 07/09/2021    Lab Results  Component Value Date   HGBA1C 6.6 (H) 10/09/2021    Lipid Panel     Component Value Date/Time   CHOL 277 (H) 10/09/2021 0838   TRIG 40.0 10/09/2021 0838   HDL 126.70 10/09/2021 0838   CHOLHDL 2 10/09/2021 0838   VLDL 8.0 10/09/2021 0838   LDLCALC 142 (H) 10/09/2021 0838   LDLDIRECT 130 (H) 09/28/2021 1133    BP Readings from Last 3 Encounters:  10/09/21 (!) 170/100  10/08/21 126/74  09/27/21 136/82    No Known Allergies   Plan: Will follow-up in 1 to 3 business days.   Loretha Brasil, PharmD [place] Pharmacist Office: [phone] Direct Dial [phone]

## 2021-10-22 NOTE — Progress Notes (Signed)
[place] [facility])                                            [place] Team    10/22/2021  ERINNE GILLENTINE 01/17/1951 [id number]  Received a call back from Ms. Thain. I reviewed patient assistance options with her for Ozempic and reviewed annual household threshold requirements for the program. Ms. Cape is going to look for her documentation and call me back tomorrow to confirm if we can procedd with the application process.   Loretha Brasil, PharmD [place] Clinical Pharmacist Office: [phone] Direct Dial: [phone]

## 2021-10-25 NOTE — Progress Notes (Signed)
[place] [facility])                                            [place] Team    10/25/2021  MECKENZIE BALSLEY November 23, 1950 [id number]   Placed telephonic outreach to patient to follow up on status of medication assistance screening and interest. Left HIPAA compliant VM for patient to return my call.   Loretha Brasil, PharmD [place] Pharmacist Office: [phone]

## 2021-10-29 NOTE — Progress Notes (Signed)
[place] [facility])                                            [place] Team    10/29/2021  LACRECIA DELVAL May 19, 1950 [id number]  Placed third and final outreach attempt to Ms. Vanscyoc. I left a HIPAA compliant voice mail for patient to return my call if she is still interested in the patient assistant program. I also left instruction that she can visit the [employer] website and print the application herself is she prefers.   Loretha Brasil, PharmD [place] Clinical Pharmacist Office: [phone] Direct Dial: [phone]

## 2021-10-30 NOTE — Progress Notes (Signed)
[place] [facility])                                            [place] Team    10/30/2021  Kristie King 04/28/1950 [id number]  Received phone call back from [name]. Informed [name] that I have outreached her PCP for the change to Ozempic and confirmed mailing address. All questions have been answered and patient was appreciative of my call.   Loretha Brasil, PharmD [place] Clinical Pharmacist Office: [phone] Direct Dial: [phone]

## 2021-10-30 NOTE — Progress Notes (Signed)
Kristie King Kristie King Kristie King Kristie King & Kristie King)  Kristie King    10/30/2021  Kristie King 07/14/1950 [id number]  Outreach:  Successful telephone call with Kristie King.  HIPAA identifiers verified.   Subjective:  Patient returned my call and is interested in pursuing the patient assistance program. I have informed patient that [employer] is not accepting any new applications for patient assistance in 2023, however I will reach out to her PCP to ask for a change in therapy to [name].   Patient will call me back after an appointment later today.   Assessment: s.    Medication Assistance Findings:  Medication assistance needs identified: Ozempic    Patient Assistance Programs: Ozempic  made by [employer] requirement met: Yes Out-of-pocket prescription expenditure met:   Not Applicable Patient has met application requirements to apply for this program.     Additional medication assistance options reviewed with patient as warranted:  No other options identified  Plan: Will contact Dr. Jerilee King  regarding Changing therapy to [name] for application to the patient assistance program.   Kristie King, PharmD [place] Pharmacist Office: [phone]'

## 2021-11-01 ENCOUNTER — Telehealth: Payer: Self-pay | Admitting: Internal Medicine

## 2021-11-01 NOTE — Telephone Encounter (Signed)
Tiffany called to check with pcp to approve change in therapy from trulicity 1.'5mg'$  to ozempic '1mg'$  to be eligible for patient assistance. [employer] is not accepting any applications.   In off chance that patient runs out of trulicity before switch is made and ozempic reaches patient, Jonelle Sidle is wondering if samples can be provided to accommodate patient     Good callback number is [phone]. Epic in basket will work as well if preferable.        Please advise

## 2021-11-02 MED ORDER — SEMAGLUTIDE (1 MG/DOSE) 4 MG/3ML ~~LOC~~ SOPN: 1.0000 mg | PEN_INJECTOR | SUBCUTANEOUS | 0 refills | Status: AC

## 2021-11-06 ENCOUNTER — Telehealth: Payer: Self-pay | Admitting: Pharmacy Technician

## 2021-11-06 ENCOUNTER — Telehealth: Payer: Self-pay | Admitting: Internal Medicine

## 2021-11-06 DIAGNOSIS — Z596 Low income: Secondary | ICD-10-CM

## 2021-11-06 NOTE — Telephone Encounter (Signed)
Patient  called to check with pcp to approve change in therapy from trulicity 1.'5mg'$  to ozempic '1mg'$  to be eligible for patient assistance. [employer] is not accepting any applications.    wondering if samples can be provided if she runs out      Good callback number for Tiffany who is handling this case is [phone]. Epic in basket will work as well if preferable.

## 2021-11-06 NOTE — Progress Notes (Signed)
[place] [facility])                                            [place] Team    11/06/2021  Kristie King [birth year] [id number]                                      Medication Assistance Referral  Referral From: [place]  Medication/Company: [employer] / [employer] Patient application portion:  Education officer, museum portion: Faxed  to Dr. Lelon Frohlich Provider address/fax verified via: Office website   Kristie King, [place]  [phone]

## 2021-11-06 NOTE — Telephone Encounter (Signed)
No samples of Loemic is available at this time.

## 2021-11-07 ENCOUNTER — Other Ambulatory Visit: Payer: Self-pay | Admitting: Internal Medicine

## 2021-11-07 DIAGNOSIS — K219 Gastro-esophageal reflux disease without esophagitis: Secondary | ICD-10-CM

## 2021-11-21 ENCOUNTER — Telehealth: Payer: Self-pay | Admitting: Pharmacy Technician

## 2021-11-21 DIAGNOSIS — Z596 Low income: Secondary | ICD-10-CM

## 2021-11-21 NOTE — Progress Notes (Signed)
[place] [facility])                                            [place] Team    11/21/2021  Kristie King 23-Sep-1950 [id number]  Received both patient and provider portion(s) of patient assistance application(s) for Ozempic. Faxed completed application and required documents into [employer].   Dishon Kehoe P. Jaryn Hocutt, [place]  [phone]

## 2021-11-23 ENCOUNTER — Telehealth: Payer: Self-pay | Admitting: Pharmacy Technician

## 2021-11-23 DIAGNOSIS — Z596 Low income: Secondary | ICD-10-CM

## 2021-11-23 NOTE — Progress Notes (Signed)
[place] [facility])                                            [place] Team    11/23/2021  DANNIELLE BASKINS [birth year] [id number]  Care coordination call placed to [place] in regard to [place] application.  Spoke to [place] who informs patient is APPROVED 11/23/21-03/03/22. Medication will be delivered to the prescribing provider's office in the next 10-15 business days. Patient is aware of her approval.  Jessye Imhoff P. Everli Rother, [place]  [phone]

## 2021-11-29 ENCOUNTER — Telehealth: Payer: Self-pay | Admitting: *Deleted

## 2021-11-29 NOTE — Telephone Encounter (Signed)
Samples of Ozempic is available for patient from patient's assistance. Patient is aware.

## 2021-12-04 ENCOUNTER — Ambulatory Visit: Payer: Medicare HMO

## 2021-12-04 ENCOUNTER — Telehealth: Payer: Self-pay | Admitting: Internal Medicine

## 2021-12-04 NOTE — Telephone Encounter (Signed)
error 

## 2021-12-11 ENCOUNTER — Ambulatory Visit (INDEPENDENT_AMBULATORY_CARE_PROVIDER_SITE_OTHER): Payer: Medicare HMO | Admitting: Internal Medicine

## 2021-12-11 VITALS — BP 150/66 | HR 64 | Temp 98.1°F | Wt 151.0 lb

## 2021-12-11 DIAGNOSIS — R0981 Nasal congestion: Secondary | ICD-10-CM

## 2021-12-11 DIAGNOSIS — J069 Acute upper respiratory infection, unspecified: Secondary | ICD-10-CM | POA: Diagnosis not present

## 2021-12-11 LAB — POCT INFLUENZA A/B
Influenza A, POC: NEGATIVE
Influenza B, POC: NEGATIVE

## 2021-12-11 LAB — POC COVID19 BINAXNOW: SARS Coronavirus 2 Ag: NEGATIVE

## 2021-12-11 NOTE — Progress Notes (Signed)
Established Patient Office Visit     CC/Reason for Visit: URI symptoms  HPI: Kristie King is a 71 y.o. female who is coming in today for the above mentioned reasons.  For the past 2 days she has been experiencing rhinorrhea with green mucus production.  She is feeling better today.  She does not have any fever, shortness of breath, sore throat, no myalgias.  She has had no recent travel or sick contacts.  She has a history of pulmonary sarcoidosis so she thought it would be best to get checked out.  Past Medical/Surgical History: Past Medical History:  Diagnosis Date   AC (acromioclavicular) arthritis 11/13/2016   Allergy    SEASONAL   Cataract    BILATERAL-REMOVED   Cervical radiculopathy at C6 09/10/2016   CKD stage 2 due to type 2 diabetes mellitus ([place]) 04/14/2018   Diabetes mellitus without complication ([place])    History of lumbar laminectomy 2019   HTN (hypertension) 09/17/2012   Hyperlipidemia    Hyperlipidemia associated with type 2 diabetes mellitus ([place]) 09/17/2012   Hypertension associated with diabetes ([place]) 04/14/2018   Iron deficiency anemia 10/16/2016   Osteoarthritis of spine with radiculopathy, cervical region 08/03/2016   Vitamin D deficiency 10/16/2016    Past Surgical History:  Procedure Laterality Date   BACK SURGERY  03/30/2018   BUNIONECTOMY Right 02/18/2013   BUNIONECTOMY Left 04/01/2013   CATARACTS      Social History:  reports that she quit smoking about 50 years ago. Her smoking use included cigarettes. She has never used smokeless tobacco. She reports current alcohol use. She reports that she does not use drugs.  Allergies: No Known Allergies  Family History:  Family History  Problem Relation Age of Onset   Stroke Mother    Stroke Father    Hyperlipidemia Sister    Diabetes Brother    Hypertension Brother    Diabetes Sister    Hypertension Sister    Stroke Maternal Grandfather    Colon cancer Neg Hx    Stomach cancer Neg Hx     Esophageal cancer Neg Hx    Pancreatic cancer Neg Hx    Colon polyps Neg Hx    Ulcerative colitis Neg Hx      Current Outpatient Medications:    albuterol (VENTOLIN HFA) 108 (90 Base) MCG/ACT inhaler, Inhale 2 puffs into the lungs every 6 (six) hours as needed for wheezing or shortness of breath., Disp: 8 g, Rfl: 2   amLODipine (NORVASC) 5 MG tablet, Take 1 tablet (5 mg total) by mouth daily., Disp: 90 tablet, Rfl: 1   atorvastatin (LIPITOR) 40 MG tablet, Take 1 tablet (40 mg total) by mouth daily., Disp: 90 tablet, Rfl: 1   Semaglutide, 1 MG/DOSE, 4 MG/3ML SOPN, Inject 1 mg as directed once a week., Disp: 9 mL, Rfl: 0   traZODone (DESYREL) 50 MG tablet, TAKE 1/2 TO 1 TABLET BY MOUTH AT BEDTIME AS NEEDED FOR SLEEP, Disp: 90 tablet, Rfl: 1   Vitamin D, Ergocalciferol, (DRISDOL) 1.25 MG (50000 UNIT) CAPS capsule, Take 1 capsule (50,000 Units total) by mouth every 7 (seven) days for 12 doses., Disp: 12 capsule, Rfl: 0   omeprazole (PRILOSEC) 40 MG capsule, TAKE 1 CAPSULE (40 MG TOTAL) BY MOUTH DAILY. (Patient not taking: Reported on 12/11/2021), Disp: 90 capsule, Rfl: 0  Review of Systems:  Constitutional: Denies fever, chills, diaphoresis, appetite change and fatigue.  HEENT: Denies photophobia, eye pain, redness, mouth sores, trouble swallowing, neck  pain, neck stiffness and tinnitus.   Respiratory: Denies SOB, DOE, cough, chest tightness,  and wheezing.   Cardiovascular: Denies chest pain, palpitations and leg swelling.  Gastrointestinal: Denies nausea, vomiting, abdominal pain, diarrhea, constipation, blood in stool and abdominal distention.  Genitourinary: Denies dysuria, urgency, frequency, hematuria, flank pain and difficulty urinating.  Endocrine: Denies: hot or cold intolerance, sweats, changes in hair or nails, polyuria, polydipsia. Musculoskeletal: Denies myalgias, back pain, joint swelling, arthralgias and gait problem.  Skin: Denies pallor, rash and wound.  Neurological: Denies  dizziness, seizures, syncope, weakness, light-headedness, numbness and headaches.  Hematological: Denies adenopathy. Easy bruising, personal or family bleeding history  Psychiatric/Behavioral: Denies suicidal ideation, mood changes, confusion, nervousness, sleep disturbance and agitation    Physical Exam: Vitals:   12/11/21 1627  BP: (!) 150/66  Pulse: 64  Temp: 98.1 F (36.7 C)  TempSrc: Oral  SpO2: 97%  Weight: 151 lb (68.5 kg)    Body mass index is 26.33 kg/m.   Constitutional: NAD, calm, comfortable Eyes: PERRL, lids and conjunctivae normal ENMT: Mucous membranes are moist. Posterior pharynx clear of any exudate or lesions. Tympanic membrane is pearly white, no erythema or bulging. Neck: normal, supple, no masses, no thyromegaly Respiratory: clear to auscultation bilaterally, no wheezing, no crackles. Normal respiratory effort. No accessory muscle use.  Cardiovascular: Regular rate and rhythm, no murmurs / rubs / gallops. No extremity edema. Psychiatric: Normal judgment and insight. Alert and oriented x 3. Normal mood.    Impression and Plan:  Nasal congestion - Plan: POCT Influenza A/B, POC COVID-19 BinaxNow  Viral upper respiratory tract infection  -In office flu and COVID tests are negative. -Given exam findings, PNA, pharyngitis, ear infection are not likely, hence abx have not been prescribed. -Have advised rest, fluids, OTC antihistamines, cough suppressants and mucinex. -RTC if no improvement in 10-14 days.   Time spent:21 minutes reviewing chart, interviewing and examining patient and formulating plan of care.      Lelon Frohlich, MD [place] [facility] at [facility]

## 2021-12-14 ENCOUNTER — Telehealth: Payer: Self-pay | Admitting: Pharmacist

## 2021-12-14 NOTE — Progress Notes (Signed)
[place] [facility])                                            [place] Team    12/14/2021  Kristie King May 18, 1950 [id number]  Placed telephonic outreach to Ms. [name] to discuss interest in re-enrollment for the 2024 year with Ozempic. Left voice mail to return my call.  Loretha Brasil, PharmD [place] Pharmacist Office: [phone]

## 2021-12-20 NOTE — Progress Notes (Signed)
[facility] [facility])                                            [place] Team    12/20/2021  Kristie King [birth year] [id number]  Placed second telephonic outreach to discuss re-enrollment for [name] for 2024. Left HIPAA compliant voice mail for patient to return my call.  Loretha Brasil, PharmD [place] Pharmacist Office: [phone]

## 2021-12-24 NOTE — Progress Notes (Addendum)
[place] [facility])                                            [place] Team    12/24/2021  ELZORA CULLINS Dec 24, 1950 [id number]   Placed third outreach attempt to patient regarding 2024 re-enrollment for Ozempic. Left HIPAA compliant voice mail for patient to return my call.   [facility] pharmacy case is being closed due to the following reasons:  -Will close [facility] pharmacy case as I have been unable to engage or maintain contact with patient  Loretha Brasil, PharmD [place] Pharmacist Office: [phone]

## 2021-12-25 ENCOUNTER — Telehealth: Payer: Self-pay | Admitting: Pharmacist

## 2021-12-25 NOTE — Progress Notes (Signed)
[place] [facility])                                            [place] Team    12/25/2021  Kristie King 04-Jun-1950 [id number]  Received a voice message from Kristie King. Placed telephonic outreach to patient and left voice mail for patient to return my call.   Loretha Brasil, PharmD [place] Pharmacist Office: [phone]'

## 2021-12-26 ENCOUNTER — Telehealth: Payer: Self-pay | Admitting: Pharmacist

## 2021-12-26 NOTE — Progress Notes (Signed)
[place] [facility])  [place] Team    12/26/2021  Kristie King [birth year] [id number]  Reason for referral: Medication Assistance  Referral source:  2024 Re-Enrollment  Current insurance: Aetna  Outreach:  Successful telephone call with [name].  HIPAA identifiers verified.   Objective: The ASCVD Risk score (Arnett DK, et al., 2019) failed to calculate for the following reasons:   The valid HDL cholesterol range is 20 to 100 mg/dL  Lab Results  Component Value Date   CREATININE 0.83 10/09/2021   CREATININE 0.87 07/20/2021   CREATININE 0.84 07/09/2021    Lab Results  Component Value Date   HGBA1C 6.6 (H) 10/09/2021    Lipid Panel     Component Value Date/Time   CHOL 277 (H) 10/09/2021 0838   TRIG 40.0 10/09/2021 0838   HDL 126.70 10/09/2021 0838   CHOLHDL 2 10/09/2021 0838   VLDL 8.0 10/09/2021 0838   LDLCALC 142 (H) 10/09/2021 0838   LDLDIRECT 130 (H) 09/28/2021 1133    BP Readings from Last 3 Encounters:  12/11/21 (!) 150/66  10/09/21 (!) 170/100  10/08/21 126/74    No Known Allergies  Assessment: Reviewed re-enrollment process with [name]. Household size and income has not changed. Patient will remain with A[employer]na [place] in 2024. Informed patient to please reach out to me if she has not received her re-enrollment applications by the first of December.   Extra Help:  Not eligible for Extra Help Low Income Subsidy based on reported income and assets  Patient Assistance Programs: Ozempic made by [employer] requirement met: Yes Out-of-pocket prescription expenditure met:   Not Applicable Patient has met application requirements to apply for this program.  Plan: I will route patient assistance letter to [place] technician who will coordinate patient assistance program application process for medications listed above.  [facility] pharmacy technician will assist with obtaining all required documents from both patient and  provider(s) and submit application(s) once completed.   Loretha Brasil, PharmD [place] Pharmacist Office: [phone]

## 2022-01-03 ENCOUNTER — Telehealth: Payer: Self-pay | Admitting: Pharmacy Technician

## 2022-01-03 DIAGNOSIS — Z596 Low income: Secondary | ICD-10-CM

## 2022-01-03 NOTE — Progress Notes (Signed)
[place] [facility])                                            [place] Team    01/03/2022  Kristie King 05/18/1950 [id number]                                      Medication Assistance Referral-FOR 2024 RE ENROLLMENT  Referral From: [place]  Medication/Company: [employer] / [employer] Patient application portion:  Education officer, museum portion: Faxed  to Dr. Lelon Frohlich Provider address/fax verified via: Office website    [employer]. [name], [place]  [phone]

## 2022-01-05 ENCOUNTER — Other Ambulatory Visit: Payer: Self-pay | Admitting: Internal Medicine

## 2022-01-05 DIAGNOSIS — E559 Vitamin D deficiency, unspecified: Secondary | ICD-10-CM

## 2022-01-09 ENCOUNTER — Ambulatory Visit (INDEPENDENT_AMBULATORY_CARE_PROVIDER_SITE_OTHER): Payer: Medicare HMO | Admitting: Internal Medicine

## 2022-01-09 ENCOUNTER — Encounter: Payer: Self-pay | Admitting: Internal Medicine

## 2022-01-09 VITALS — BP 120/79 | HR 65 | Temp 98.2°F | Wt 146.5 lb

## 2022-01-09 DIAGNOSIS — D860 Sarcoidosis of lung: Secondary | ICD-10-CM | POA: Diagnosis not present

## 2022-01-09 DIAGNOSIS — E559 Vitamin D deficiency, unspecified: Secondary | ICD-10-CM | POA: Diagnosis not present

## 2022-01-09 DIAGNOSIS — Z23 Encounter for immunization: Secondary | ICD-10-CM | POA: Diagnosis not present

## 2022-01-09 DIAGNOSIS — E7800 Pure hypercholesterolemia, unspecified: Secondary | ICD-10-CM

## 2022-01-09 DIAGNOSIS — E1169 Type 2 diabetes mellitus with other specified complication: Secondary | ICD-10-CM

## 2022-01-09 DIAGNOSIS — I152 Hypertension secondary to endocrine disorders: Secondary | ICD-10-CM | POA: Diagnosis not present

## 2022-01-09 DIAGNOSIS — E1159 Type 2 diabetes mellitus with other circulatory complications: Secondary | ICD-10-CM

## 2022-01-09 LAB — LIPID PANEL
Cholesterol: 246 mg/dL — ABNORMAL HIGH (ref 0–200)
HDL: 162 mg/dL (ref >39.00)
LDL Cholesterol: 74 mg/dL (ref 0–99)
NonHDL: 83.62
Total CHOL/HDL Ratio: 2
Triglycerides: 47 mg/dL (ref 0.0–149.0)
VLDL: 9.4 mg/dL (ref 0.0–40.0)

## 2022-01-09 LAB — VITAMIN D 25 HYDROXY (VIT D DEFICIENCY, FRACTURES): VITD: 33.68 ng/mL (ref 30.00–100.00)

## 2022-01-09 LAB — POCT GLYCOSYLATED HEMOGLOBIN (HGB A1C): Hemoglobin A1C: 5.9 % — AB (ref 4.0–5.6)

## 2022-01-09 NOTE — Progress Notes (Signed)
Established Patient Office Visit     CC/Reason for Visit: Follow-up chronic conditions  HPI: Kristie King is a 71 y.o. female who is coming in today for the above mentioned reasons. Past Medical History is significant for: Hypertension, hyperlipidemia, type 2 diabetes, pulmonary sarcoid.  She is feeling well today.  Is here for routine follow-up, requesting flu vaccine.   Past Medical/Surgical History: Past Medical History:  Diagnosis Date   AC (acromioclavicular) arthritis 11/13/2016   Allergy    SEASONAL   Cataract    BILATERAL-REMOVED   Cervical radiculopathy at C6 09/10/2016   CKD stage 2 due to type 2 diabetes mellitus ([place]) 04/14/2018   Diabetes mellitus without complication ([place])    History of lumbar laminectomy 2019   HTN (hypertension) 09/17/2012   Hyperlipidemia    Hyperlipidemia associated with type 2 diabetes mellitus ([place]) 09/17/2012   Hypertension associated with diabetes ([place]) 04/14/2018   Iron deficiency anemia 10/16/2016   Osteoarthritis of spine with radiculopathy, cervical region 08/03/2016   Vitamin D deficiency 10/16/2016    Past Surgical History:  Procedure Laterality Date   BACK SURGERY  03/30/2018   BUNIONECTOMY Right 02/18/2013   BUNIONECTOMY Left 04/01/2013   CATARACTS      Social History:  reports that she quit smoking about 51 years ago. Her smoking use included cigarettes. She has never used smokeless tobacco. She reports current alcohol use. She reports that she does not use drugs.  Allergies: No Known Allergies  Family History:  Family History  Problem Relation Age of Onset   Stroke Mother    Stroke Father    Hyperlipidemia Sister    Diabetes Brother    Hypertension Brother    Diabetes Sister    Hypertension Sister    Stroke Maternal Grandfather    Colon cancer Neg Hx    Stomach cancer Neg Hx    Esophageal cancer Neg Hx    Pancreatic cancer Neg Hx    Colon polyps Neg Hx    Ulcerative colitis Neg Hx      Current Outpatient  Medications:    albuterol (VENTOLIN HFA) 108 (90 Base) MCG/ACT inhaler, Inhale 2 puffs into the lungs every 6 (six) hours as needed for wheezing or shortness of breath., Disp: 8 g, Rfl: 2   amLODipine (NORVASC) 5 MG tablet, Take 1 tablet (5 mg total) by mouth daily., Disp: 90 tablet, Rfl: 1   atorvastatin (LIPITOR) 40 MG tablet, Take 1 tablet (40 mg total) by mouth daily., Disp: 90 tablet, Rfl: 1   Semaglutide, 1 MG/DOSE, 4 MG/3ML SOPN, Inject 1 mg as directed once a week., Disp: 9 mL, Rfl: 0  Review of Systems:  Constitutional: Denies fever, chills, diaphoresis, appetite change and fatigue.  HEENT: Denies photophobia, eye pain, redness, hearing loss, ear pain, congestion, sore throat, rhinorrhea, sneezing, mouth sores, trouble swallowing, neck pain, neck stiffness and tinnitus.   Respiratory: Denies SOB, DOE, cough, chest tightness,  and wheezing.   Cardiovascular: Denies chest pain, palpitations and leg swelling.  Gastrointestinal: Denies nausea, vomiting, abdominal pain, diarrhea, constipation, blood in stool and abdominal distention.  Genitourinary: Denies dysuria, urgency, frequency, hematuria, flank pain and difficulty urinating.  Endocrine: Denies: hot or cold intolerance, sweats, changes in hair or nails, polyuria, polydipsia. Musculoskeletal: Denies myalgias, back pain, joint swelling, arthralgias and gait problem.  Skin: Denies pallor, rash and wound.  Neurological: Denies dizziness, seizures, syncope, weakness, light-headedness, numbness and headaches.  Hematological: Denies adenopathy. Easy bruising, personal or family bleeding history  Psychiatric/Behavioral: Denies suicidal ideation, mood changes, confusion, nervousness, sleep disturbance and agitation    Physical Exam: Vitals:   01/09/22 0843 01/09/22 0912  BP: 120/80 120/79  Pulse: 65   Temp: 98.2 F (36.8 C)   TempSrc: Oral   SpO2: 98%   Weight: 146 lb 8 oz (66.5 kg)     Body mass index is 25.54  kg/m.   Constitutional: NAD, calm, comfortable Eyes: PERRL, lids and conjunctivae normal ENMT: Mucous membranes are moist.  Respiratory: clear to auscultation bilaterally, no wheezing, no crackles. Normal respiratory effort. No accessory muscle use.  Cardiovascular: Regular rate and rhythm, no murmurs / rubs / gallops. No extremity edema.  Psychiatric: Normal judgment and insight. Alert and oriented x 3. Normal mood.    Impression and Plan:  Type 2 diabetes mellitus with other specified complication, without long-term current use of insulin ([place]) - Plan: POCT glycosylated hemoglobin (Hb A1C)  Hypertension associated with diabetes ([place])  Sarcoidosis of lung ([place])  Need for influenza vaccination - Plan: Flu Vaccine QUAD High Dose(Fluad)  Hypercholesteremia - Plan: Lipid panel  Vitamin D deficiency - Plan: VITAMIN D 25 Hydroxy (Vit-D Deficiency, Fractures)  -Flu vaccine administered in office today. -A1c demonstrates excellent control at 5.9. -Blood pressure is well controlled at 120/80.  Time spent:31 minutes reviewing chart, interviewing and examining patient and formulating plan of care.      Lelon Frohlich, MD [place] [facility] at [facility]

## 2022-01-11 ENCOUNTER — Other Ambulatory Visit: Payer: Medicare HMO

## 2022-02-08 DIAGNOSIS — L818 Other specified disorders of pigmentation: Secondary | ICD-10-CM | POA: Diagnosis not present

## 2022-02-10 ENCOUNTER — Telehealth: Payer: Self-pay | Admitting: Pharmacy Technician

## 2022-02-10 DIAGNOSIS — Z596 Low income: Secondary | ICD-10-CM

## 2022-02-10 NOTE — Progress Notes (Signed)
[place] [facility])                                            [place] Team    02/10/2022  Kristie King 08-31-1950 [id number]  Received both patient and provider portion(s) of patient assistance application(s) for Ozempic. Faxed completed application and required documents into [employer].    Graceland Wachter P. Giulietta Prokop, [place]  [phone]

## 2022-02-21 DIAGNOSIS — Z1231 Encounter for screening mammogram for malignant neoplasm of breast: Secondary | ICD-10-CM | POA: Diagnosis not present

## 2022-02-21 LAB — HM MAMMOGRAPHY

## 2022-02-28 ENCOUNTER — Encounter: Payer: Self-pay | Admitting: Internal Medicine

## 2022-03-12 ENCOUNTER — Ambulatory Visit (INDEPENDENT_AMBULATORY_CARE_PROVIDER_SITE_OTHER): Payer: Medicare HMO | Admitting: Family Medicine

## 2022-03-12 ENCOUNTER — Encounter: Payer: Self-pay | Admitting: Family Medicine

## 2022-03-12 VITALS — BP 156/80 | HR 62 | Temp 97.8°F | Ht 63.5 in | Wt 143.9 lb

## 2022-03-12 DIAGNOSIS — I10 Essential (primary) hypertension: Secondary | ICD-10-CM

## 2022-03-12 DIAGNOSIS — R040 Epistaxis: Secondary | ICD-10-CM | POA: Diagnosis not present

## 2022-03-12 MED ORDER — AMLODIPINE BESYLATE 5 MG PO TABS: 5.0000 mg | ORAL_TABLET | Freq: Every day | ORAL | 1 refills | Status: DC

## 2022-03-12 NOTE — Progress Notes (Signed)
Established Patient Office Visit  Subjective   Patient ID: Kristie King, female    DOB: 03-Sep-1950  Age: 72 y.o. MRN: [id number]  Chief Complaint  Patient presents with   Epistaxis    X1 day   Hypertension    Patient states her blood pressure was elevated at home with reading of 153/74, later 150/71, states PCP advised to discontinue Amlodipine and she took one pill yesterday    Pt is reporting an increase in blood pressure at home, states that she was told to stop the amlodipine after her last visit with her PCP, states that after that her BP at home were kind of up and down, some days were ok and some days they were elevated then started having nosebleeds the past 2 days. Pt states that she gets nosebleeds typically when the weather changes-- states that when the air gets very dry she will have a nosebleed. She denies any fever/chills, no cold or flu like symptoms, no headaches, no dizziness.   Current Outpatient Medications  Medication Instructions   albuterol (VENTOLIN HFA) 108 (90 Base) MCG/ACT inhaler 2 puffs, Inhalation, Every 6 hours PRN   amLODipine (NORVASC) 5 mg, Oral, Daily   atorvastatin (LIPITOR) 40 mg, Oral, Daily    Patient Active Problem List   Diagnosis Date Noted   Acute upper respiratory infection 09/27/2021   Chronic maxillary sinusitis 06/08/2021   Laryngopharyngeal reflux (LPR) 06/08/2021   Hx of adenomatous colonic polyps 05/01/2021   Chronic cough 05/01/2021   Sore throat 05/01/2021   Hypercholesteremia 05/24/2020   Anemia of chronic disease 05/18/2020   Hx of decompressive lumbar laminectomy 04/17/2018   Hypertension associated with diabetes ([place]) 04/14/2018   Sarcoidosis of lung ([place]) 04/14/2018   Allergic rhinitis 04/14/2018   Lumbar spinal stenosis 01/21/2017   AC (acromioclavicular) arthritis 11/13/2016   Vitamin D deficiency 10/16/2016   Cervical radiculopathy at C6 09/10/2016   Osteoarthritis of spine with radiculopathy, cervical region  08/03/2016   Synovitis of knee 09/01/2013   Type 2 diabetes mellitus with other specified complication ([place])       Review of Systems  All other systems reviewed and are negative.     Objective:     BP (!) 156/80 Comment: repeated by Mykal--jaf  Pulse 62   Temp 97.8 F (36.6 C) (Oral)   Ht 5' 3.5" (1.613 m)   Wt 143 lb 14.4 oz (65.3 kg)   SpO2 99%   BMI 25.09 kg/m  BP Readings from Last 3 Encounters:  03/12/22 (!) 156/80  01/09/22 120/79  12/11/21 (!) 150/66      Physical Exam Vitals reviewed.  Constitutional:      Appearance: Normal appearance. She is normal weight.  HENT:     Nose:     Right Nostril: Epistaxis present.     Right Turbinates: Not enlarged or swollen.     Left Turbinates: Not enlarged or swollen.     Comments: Mucosal layer in the BL nares is erythematous, dry, there is a clot of dried blood noted in the right nares adherent to the septum. Cardiovascular:     Rate and Rhythm: Normal rate and regular rhythm.     Heart sounds: Normal heart sounds.  Pulmonary:     Effort: Pulmonary effort is normal.     Breath sounds: Normal breath sounds.  Musculoskeletal:     Cervical back: Normal range of motion.  Neurological:     Mental Status: She is alert.  No results found for any visits on 03/12/22.    The ASCVD Risk score (Arnett DK, et al., 2019) failed to calculate for the following reasons:   The valid HDL cholesterol range is 20 to 100 mg/dL    Assessment & Plan:   Problem List Items Addressed This Visit   None Visit Diagnoses     Primary hypertension    -  Primary   Relevant Medications   BP is back up, I recommended that she restart the amlodipine 5 mg daily. I have sent in refills for her. She will follow up with Dr. Jerilee Hoh in March.   amLODipine (NORVASC) 5 MG tablet   Right-sided epistaxis      Intermittent, likely due to dry air and mucosal irritation. I recommended she use a humidifier in her home, also she may use nasal  saline spray to help moisturize the mucosal layer. Do not use any steroid nasal sprays for now until the bleeding and irritation has resolved.      Return for pt needs to see Dr. Jerilee Hoh in March.    Farrel Conners, MD

## 2022-03-14 ENCOUNTER — Telehealth: Payer: Self-pay | Admitting: Pharmacy Technician

## 2022-03-14 DIAGNOSIS — Z596 Low income: Secondary | ICD-10-CM

## 2022-03-14 NOTE — Progress Notes (Signed)
[facility])                                            [place] Team    03/14/2022  Kristie King 04/23/1950 [id number]  Care coordination call placed to [place] in regard to [place] application.  Spoke to Opal Sidles who informs patient is APPROVED 03/06/22-03/04/23. Medication will ship based on last fill date in 2023 to the prescriber's office.  Kristie King P. [name], [place]  [phone]

## 2022-03-25 ENCOUNTER — Telehealth: Payer: Self-pay | Admitting: Internal Medicine

## 2022-03-25 NOTE — Telephone Encounter (Signed)
Pt has a fax for [employer] Patient Assistant Program that was sent in and we normally print these out and hand it to [facility] but right now we have no one to send these to.   Who do we need to give these forms to until the new Pharmacist comes in?  Please advise

## 2022-03-25 NOTE — Telephone Encounter (Signed)
Please give the fax to me and I will try to help.  thanks

## 2022-03-26 ENCOUNTER — Telehealth: Payer: Self-pay | Admitting: *Deleted

## 2022-03-26 ENCOUNTER — Encounter: Payer: Self-pay | Admitting: Internal Medicine

## 2022-03-26 ENCOUNTER — Ambulatory Visit (INDEPENDENT_AMBULATORY_CARE_PROVIDER_SITE_OTHER): Payer: Medicare HMO | Admitting: Internal Medicine

## 2022-03-26 VITALS — BP 110/68 | Temp 98.6°F | Wt 140.9 lb

## 2022-03-26 DIAGNOSIS — G4700 Insomnia, unspecified: Secondary | ICD-10-CM | POA: Diagnosis not present

## 2022-03-26 DIAGNOSIS — R040 Epistaxis: Secondary | ICD-10-CM

## 2022-03-26 DIAGNOSIS — E1169 Type 2 diabetes mellitus with other specified complication: Secondary | ICD-10-CM | POA: Diagnosis not present

## 2022-03-26 LAB — POCT GLYCOSYLATED HEMOGLOBIN (HGB A1C): Hemoglobin A1C: 5.8 % — AB (ref 4.0–5.6)

## 2022-03-26 MED ORDER — TRAZODONE HCL 50 MG PO TABS: 25.0000 mg | ORAL_TABLET | Freq: Every evening | ORAL | 1 refills | Status: DC | PRN

## 2022-03-26 NOTE — Telephone Encounter (Signed)
Patient received a sample of Ozempic 1 x 3 ml 1 pen at office visit 03/26/22 from patient assistance [employer].

## 2022-03-26 NOTE — Progress Notes (Signed)
Established Patient Office Visit     CC/Reason for Visit: Follow-up chronic conditions, discuss acute concerns  HPI: Kristie King is a 72 y.o. female who is coming in today for the above mentioned reasons. Past Medical History is significant for: Type 2 diabetes, pulmonary sarcoidosis,, hypertension, hyperlipidemia.  She has been having some nosebleeds, these are mild.  She is also requesting a refill of trazodone that she uses sporadically for issues with sleep.   Past Medical/Surgical History: Past Medical History:  Diagnosis Date   AC (acromioclavicular) arthritis 11/13/2016   Allergy    SEASONAL   Cataract    BILATERAL-REMOVED   Cervical radiculopathy at C6 09/10/2016   CKD stage 2 due to type 2 diabetes mellitus ([place]) 04/14/2018   Diabetes mellitus without complication ([place])    History of lumbar laminectomy 2019   HTN (hypertension) 09/17/2012   Hyperlipidemia    Hyperlipidemia associated with type 2 diabetes mellitus ([place]) 09/17/2012   Hypertension associated with diabetes ([place]) 04/14/2018   Iron deficiency anemia 10/16/2016   Osteoarthritis of spine with radiculopathy, cervical region 08/03/2016   Vitamin D deficiency 10/16/2016    Past Surgical History:  Procedure Laterality Date   BACK SURGERY  03/30/2018   BUNIONECTOMY Right 02/18/2013   BUNIONECTOMY Left 04/01/2013   CATARACTS      Social History:  reports that she quit smoking about 51 years ago. Her smoking use included cigarettes. She has never used smokeless tobacco. She reports current alcohol use. She reports that she does not use drugs.  Allergies: No Known Allergies  Family History:  Family History  Problem Relation Age of Onset   Stroke Mother    Stroke Father    Hyperlipidemia Sister    Diabetes Brother    Hypertension Brother    Diabetes Sister    Hypertension Sister    Stroke Maternal Grandfather    Colon cancer Neg Hx    Stomach cancer Neg Hx    Esophageal cancer Neg Hx    Pancreatic  cancer Neg Hx    Colon polyps Neg Hx    Ulcerative colitis Neg Hx      Current Outpatient Medications:    albuterol (VENTOLIN HFA) 108 (90 Base) MCG/ACT inhaler, Inhale 2 puffs into the lungs every 6 (six) hours as needed for wheezing or shortness of breath., Disp: 8 g, Rfl: 2   amLODipine (NORVASC) 5 MG tablet, Take 1 tablet (5 mg total) by mouth daily., Disp: 90 tablet, Rfl: 1   atorvastatin (LIPITOR) 40 MG tablet, Take 1 tablet (40 mg total) by mouth daily., Disp: 90 tablet, Rfl: 1   Semaglutide (OZEMPIC, 1 MG/DOSE, ), Inject 1 mg into the skin every 7 (seven) days., Disp: , Rfl:    traZODone (DESYREL) 50 MG tablet, Take 0.5-1 tablets (25-50 mg total) by mouth at bedtime as needed for sleep., Disp: 30 tablet, Rfl: 1  Review of Systems:  Negative unless indicated in HPI.   Physical Exam: Vitals:   03/26/22 1003  BP: 110/68  Temp: 98.6 F (37 C)  TempSrc: Oral  Weight: 140 lb 14.4 oz (63.9 kg)    Body mass index is 24.57 kg/m.   Physical Exam Vitals reviewed.  Constitutional:      Appearance: Normal appearance.  HENT:     Head: Normocephalic and atraumatic.  Eyes:     Conjunctiva/sclera: Conjunctivae normal.     Pupils: Pupils are equal, round, and reactive to light.  Cardiovascular:     Rate  and Rhythm: Normal rate and regular rhythm.  Pulmonary:     Effort: Pulmonary effort is normal.     Breath sounds: Normal breath sounds.  Skin:    General: Skin is warm and dry.  Neurological:     General: No focal deficit present.     Mental Status: She is alert and oriented to person, place, and time.  Psychiatric:        Mood and Affect: Mood normal.        Behavior: Behavior normal.        Thought Content: Thought content normal.        Judgment: Judgment normal.      Impression and Plan:  Type 2 diabetes mellitus with other specified complication, without long-term current use of insulin (HCC) - Plan: POCT glycosylated hemoglobin (Hb  A1C)  Epistaxis  Insomnia, unspecified type - Plan: traZODone (DESYREL) 50 MG tablet  -A1c of 5.8 demonstrates excellent diabetic control. -She is now getting Ozempic through patient assistance. -I believe epistaxis is likely related to dry heat during wintertime.  She will purchase moist air humidifiers throughout her house.  Epistaxis is mild. -She has an old prescription for trazodone and she has been breaking it in half this seems to work well for her.  She is requesting a refill.  Time spent:32 minutes reviewing chart, interviewing and examining patient and formulating plan of care.     Lelon Frohlich, MD [facility]

## 2022-03-29 ENCOUNTER — Telehealth: Payer: Self-pay | Admitting: Pharmacist

## 2022-03-29 ENCOUNTER — Telehealth: Payer: Self-pay | Admitting: Pharmacy Technician

## 2022-03-29 DIAGNOSIS — Z596 Low income: Secondary | ICD-10-CM

## 2022-03-29 NOTE — Progress Notes (Signed)
[facility])                                            [place] Team    03/29/2022  Kristie King October 12, 1950 [id number]  Placed telephonic outreach to Ms. Wardell Honour, HIPAA identifiers were verified.   Relayed message that per [employer],  1 box was delivered on 03/22/22 and signed for by [employer]. Informed patient this was an error on [employer] part. Informed patient an order has been placed for 3 more boxes (90 days supply) which will be delivered to the prescriber's office in the next 10-15 business days. Also informed that next refill will begin processing on 05/29/22 and that order will be for 4 boxes (120 days supply) with delivery in 10-15 business days from that date.   Patient advised that any concerns or questions for faulty pen will have to be reported directly to [employer] by patient.   Patient was appreciative of my call.   Loretha Brasil, PharmD [place] Clinical Pharmacist Direct Dial: [phone]

## 2022-03-29 NOTE — Progress Notes (Signed)
[facility])                                            [place] Team    03/29/2022  Kristie King 1950/06/30 [id number]  Care coordination call placed to [place] in regard to delivery issue with Ozempic. Patient had left voicemail about only receiving 1 box.  Spoke to [place] at [employer] and required about last delivery. Per [facility] [facility] 1 box was delivered on 03/22/22 and signed for by [facility]. She informs that was an error on [employer] part. She informs she will place an order for 3 more boxes (90 days supply) which wll be delivered to the prescriber's office in the next 10-15 business days. She also informs that next refill will begin processing on 05/29/22 and that order will be for 4 boxes (120 days supply) with delivery in 10-15 business days from that date. Also inquired about the procedure for if a pen does not work properly. She informs the patient should call [facility] immediately to report the issue at [phone].  Sahej Schrieber P. Adie Vilar, [place]  [phone]

## 2022-04-05 ENCOUNTER — Other Ambulatory Visit: Payer: Medicare HMO

## 2022-04-09 ENCOUNTER — Telehealth: Payer: Self-pay | Admitting: *Deleted

## 2022-04-09 NOTE — Telephone Encounter (Signed)
3 boxes of Ozempic from [facility] (samples) are available for patient to pick up. Left message on machine for patient.

## 2022-04-20 ENCOUNTER — Other Ambulatory Visit: Payer: Self-pay | Admitting: Internal Medicine

## 2022-04-20 DIAGNOSIS — G4700 Insomnia, unspecified: Secondary | ICD-10-CM

## 2022-05-09 ENCOUNTER — Ambulatory Visit: Payer: Medicare HMO | Admitting: Internal Medicine

## 2022-05-10 ENCOUNTER — Encounter (HOSPITAL_BASED_OUTPATIENT_CLINIC_OR_DEPARTMENT_OTHER): Payer: Self-pay | Admitting: Pulmonary Disease

## 2022-05-10 ENCOUNTER — Ambulatory Visit (HOSPITAL_BASED_OUTPATIENT_CLINIC_OR_DEPARTMENT_OTHER): Payer: Medicare HMO | Admitting: Pulmonary Disease

## 2022-05-10 VITALS — BP 128/70 | HR 70 | Ht 63.5 in | Wt 142.0 lb

## 2022-05-10 DIAGNOSIS — R040 Epistaxis: Secondary | ICD-10-CM

## 2022-05-10 DIAGNOSIS — D869 Sarcoidosis, unspecified: Secondary | ICD-10-CM

## 2022-05-10 MED ORDER — IPRATROPIUM BROMIDE 0.03 % NA SOLN: 2.0000 | Freq: Two times a day (BID) | NASAL | 2 refills | Status: DC

## 2022-05-10 NOTE — Progress Notes (Unsigned)
Subjective:   PATIENT ID: Juluis Pitch GENDER: female DOB: 1951/01/03, MRN: [id number]   HPI  Chief Complaint  Patient presents with   Follow-up    Having nose bleeds often is concerning to her    Reason for Visit: Follow-up  [name] is 72 year old female with sarcoidosis, arthritis, HTN, DM2, HLD who presents for follow-up.  Initial consult She intially presented with facial and arm lesions and diagnosed via skin biopsy in the 1970s. She was treated with high dose steroids and methotrexate in the 80s. She was previously followed by Pulmonary at [place]. Last note in 10/22/12 by [facility] and tapered off steroids. Note comments that she had sarcoid involving lung, skin, sinus and larynx. She was seen by Dr. Vanessa Kick in Dermatology in 2016 and had received injections for facial lesions.  She reports cough for the last 8-9 months. She was seen by GI. Scheduled for ENT next month. Seen by PCP and advised to hold lisinopril for her hypertension. Has started some reflux medication with help in cough. Reports nasal congestion  07/06/21 She recently returned from vacation. Diagnosed with strep throat and started on amoxicillin. She reports cough before this illness remains persistent. When she held her lisinopril but no appreciable difference. Uses flonase nightly. However had nose bleeds x 2. Stopped omeprazole after 10 days   10/08/21 Last month she was treated with Z pack for acute respiratory infection. She has productive cough that has persisted for the last two weeks. No wheezing or shortness of breath. Prior to her illness she felt her cough was doing better and even self discontinued nasal sprays and reflux meds.  05/10/22 Since our last visit she was doing well until the last few weeks when she had increased nasal congestion. Also has had more frequent nosebleeds. This will occur with changes in humidity. Uses humidifier at night. Taking sudaphed as needed and zyrtec. She  is working out 4 days a week. Denies fatigue, shortness of breath, cough, wheezing, chest pain. No limitations in activity  Social History: Retired [employer] employee  Environmental exposures: Asbestosis  Past Medical History:  Diagnosis Date   AC (acromioclavicular) arthritis 11/13/2016   Allergy    SEASONAL   Cataract    BILATERAL-REMOVED   Cervical radiculopathy at C6 09/10/2016   CKD stage 2 due to type 2 diabetes mellitus ([place]) 04/14/2018   Diabetes mellitus without complication (HCC)    History of lumbar laminectomy 2019   HTN (hypertension) 09/17/2012   Hyperlipidemia    Hyperlipidemia associated with type 2 diabetes mellitus ([place]) 09/17/2012   Hypertension associated with diabetes ([place]) 04/14/2018   Iron deficiency anemia 10/16/2016   Osteoarthritis of spine with radiculopathy, cervical region 08/03/2016   Vitamin D deficiency 10/16/2016     Family History  Problem Relation Age of Onset   Stroke Mother    Stroke Father    Hyperlipidemia Sister    Diabetes Brother    Hypertension Brother    Diabetes Sister    Hypertension Sister    Stroke Maternal Grandfather    Colon cancer Neg Hx    Stomach cancer Neg Hx    Esophageal cancer Neg Hx    Pancreatic cancer Neg Hx    Colon polyps Neg Hx    Ulcerative colitis Neg Hx      Social History   Occupational History   Occupation: Retired    Fish farm manager: [employer]  Tobacco Use   Smoking status: Former  Years: 10.00    Types: Cigarettes    Quit date: 01/15/1971    Years since quitting: 51.3   Smokeless tobacco: Never  Vaping Use   Vaping Use: Never used  Substance and Sexual Activity   Alcohol use: Yes    Comment: 09/17/2012 "drink on special occasions only"   Drug use: No   Sexual activity: Yes    Partners: Male    No Known Allergies   Outpatient Medications Prior to Visit  Medication Sig Dispense Refill   amLODipine (NORVASC) 5 MG tablet Take 1 tablet (5 mg total) by mouth daily. 90 tablet 1   atorvastatin (LIPITOR) 40  MG tablet Take 1 tablet (40 mg total) by mouth daily. 90 tablet 1   Semaglutide (OZEMPIC, 1 MG/DOSE, [place]) Inject 1 mg into the skin every 7 (seven) days.     traZODone (DESYREL) 50 MG tablet TAKE 0.5-1 TABLETS BY MOUTH AT BEDTIME AS NEEDED FOR SLEEP. 90 tablet 1   albuterol (VENTOLIN HFA) 108 (90 Base) MCG/ACT inhaler Inhale 2 puffs into the lungs every 6 (six) hours as needed for wheezing or shortness of breath. 8 g 2   No facility-administered medications prior to visit.    Review of Systems  Constitutional:  Negative for chills, diaphoresis, fever, malaise/fatigue and weight loss.  HENT:  Negative for congestion.   Respiratory:  Positive for cough. Negative for hemoptysis, sputum production, shortness of breath and wheezing.   Cardiovascular:  Negative for chest pain, palpitations and leg swelling.     Objective:   Vitals:   05/10/22 1438  BP: 128/70  Pulse: 70  SpO2: 99%  Weight: 142 lb (64.4 kg)  Height: 5' 3.5" (1.613 m)   SpO2: 99 % O2 Device: None (Room air)  Physical Exam: General: Well-appearing, no acute distress HENT: [place], AT Eyes: EOMI, no scleral icterus Respiratory: Clear to auscultation bilaterally.  No crackles, wheezing or rales Cardiovascular: RRR, -M/R/G, no JVD Extremities:-Edema,-tenderness Neuro: AAO x4, CNII-XII grossly intact Psych: Normal mood, normal affect  Data Reviewed:  Imaging: CXR 05/06/21 - No infiltrate, effusion or edema CT Chest 09/28/21 - Nearly reoslved GGO bilaterally in peribronchovascular distribution that is less pronounced. Perilymphatic nodularities with largest in LUL. Marland Kitchen  PFT: 07/06/21 FVC 2.16 (91%) FEV1 1.56 (85%) Ratio 69  TLC 103% DLCO 100% Interpretation: Normal PFTs. No obstructive or restrictive defect. No significant bronchodilator response however does not preclude benefit of inhalers.    Labs:    Latest Ref Rng & Units 10/09/2021    8:38 AM 05/11/2021    9:15 AM 04/26/2021   12:04 PM  CBC  WBC 4.0 - 10.5 K/uL 6.6   7.4  5.5   Hemoglobin 12.0 - 15.0 g/dL 11.4  11.3  11.7   Hematocrit 36.0 - 46.0 % 34.5  34.5  35.8   Platelets 150.0 - 400.0 K/uL 326.0  305.0  272.0   Stable anemia      Latest Ref Rng & Units 10/09/2021    8:38 AM 07/20/2021    9:27 AM 07/09/2021    1:48 PM  BMP  Glucose 70 - 99 mg/dL 96  77  110   BUN 6 - 23 mg/dL '25  19  19   '$ Creatinine 0.40 - 1.20 mg/dL 0.83  0.87  0.84   Sodium 135 - 145 mEq/L 140  142  137   Potassium 3.5 - 5.1 mEq/L 3.5  3.6  3.3   Chloride 96 - 112 mEq/L 104  103  103  CO2 19 - 32 mEq/L '27  30  26   '$ Calcium 8.4 - 10.5 mg/dL 9.4  9.5  9.4   Hypokalemia resolved Cr normalized      Latest Ref Rng & Units 10/09/2021    8:38 AM 04/26/2021   12:04 PM 11/10/2020   11:47 AM  Hepatic Function  Total Protein 6.0 - 8.3 g/dL 7.0  7.1  6.8   Albumin 3.5 - 5.2 g/dL 4.1  4.4  3.9   AST 0 - 37 U/L '20  16  20   '$ ALT 0 - 35 U/L '6  5  8   '$ Alk Phosphatase 39 - 117 U/L 47  52  49   Total Bilirubin 0.2 - 1.2 mg/dL 0.6  0.7  0.6   Normal LFTs  Absolute eos 04/26/21 - 100     Assessment & Plan:   Discussion: 72 year old female with sarcoidosis, arthritis, HTN, DM2, HLD who presents for follow-up. Currently with post-viral cough <8 weeks. May have prolonged respiratory symptoms due to underlying sarcoid. Previously with improved symptoms prior to this illness.  We discussed the clinical course of sarcoid and management including serial PFTs, labs, eye exam, and EKG and chest imaging if indicated. If symptoms suggest sarcoid flare in the future, we would manage with steroids +/- biologics.  Epistaxis Nasal congestion --Afrin as needed for active nose bleeds --REFER to ENT --OK to use atrovent 1 spray per nostril every night if nasal congestion recurs  Pulmonary and skin sarcoidosis --Dx in 1970s via skin bx --Annual ophthalmology exam. [place] 07/10/21 note - No ocular sarcoid involvement. Due 2024 --Routine Dermatology. Last visit 02/2022. Due 06/2022 --Labs q6  months. Will defer to next visit. Patient will look into any prior labwork  Sarcoid Monitoring --Recent chest imaging reviewed. CT Chest 09/2021 Overall improved from acute findings --Annual PFTs. Due in 18-24 months --Annual ophthalmology exam. UTD 2023  Chronic cough Normal PFTs --CONTINUE Albuterol TWO puffs in  morning and at night  --Allergies: CONTINUE Zyrtec (cetrizine) 10 mg once a day. Buy over-the counter.                    OK to use atrovent 1 spray per nostril every night if nasal congestion recurs --Anti-reflux trial did not help   Health Maintenance Immunization History  Administered Date(s) Administered   Fluad Quad(high Dose 65+) 11/11/2018, 11/10/2020, 01/09/2022   Influenza, High Dose Seasonal PF 12/12/2017   Influenza,inj,Quad PF,6+ Mos 03/09/2015   Influenza-Unspecified 02/07/2017   PFIZER(Purple Top)SARS-COV-2 Vaccination 04/08/2019, 04/29/2019, 11/30/2019, 01/09/2021   PNEUMOCOCCAL CONJUGATE-20 07/09/2021   Pneumococcal Polysaccharide-23 05/18/2020   Tdap 03/12/2021   Zoster Recombinat (Shingrix) 01/02/2022   CT Lung Screen - not qualified. Quit smoking >50 lbs  No orders of the defined types were placed in this encounter.  No orders of the defined types were placed in this encounter.   No follow-ups on file.   I have spent a total time of 35-minutes on the day of the appointment including chart review, data review, collecting history, coordinating care and discussing medical diagnosis and plan with the patient/family. Past medical history, allergies, medications were reviewed. Pertinent imaging, labs and tests included in this note have been reviewed and interpreted independently by me  Rexanna Louthan Rodman Pickle, MD [facility] 05/10/2022 2:54 PM  Office Number [phone]

## 2022-05-10 NOTE — Patient Instructions (Addendum)
Pulmonary and skin sarcoidosis --Dx in 1970s via skin bx --Annual ophthalmology exam. [facility] 07/10/21 note - No ocular sarcoid involvement. Due 2024 --Routine Dermatology appt. Last visit 02/2022. Next 06/2022 --Labs q6 months. Will defer to next visit. Patient will look into any prior labwork  Epistaxis Nasal congestion --Afrin as needed for active nose bleeds --REFER to ENT --OK to use atrovent 1 spray per nostril every night if nasal congestion recurs  Follow-up with me in 6 months. Will obtain labs if none completed by then

## 2022-05-12 ENCOUNTER — Encounter (HOSPITAL_BASED_OUTPATIENT_CLINIC_OR_DEPARTMENT_OTHER): Payer: Self-pay | Admitting: Pulmonary Disease

## 2022-05-19 ENCOUNTER — Other Ambulatory Visit: Payer: Self-pay | Admitting: Internal Medicine

## 2022-05-19 DIAGNOSIS — E7800 Pure hypercholesterolemia, unspecified: Secondary | ICD-10-CM

## 2022-05-30 ENCOUNTER — Ambulatory Visit
Admission: RE | Admit: 2022-05-30 | Discharge: 2022-05-30 | Disposition: A | Discharge status: Home / Self Care | Payer: Medicare HMO | Source: Ambulatory Visit | Attending: Internal Medicine | Admitting: Internal Medicine

## 2022-05-30 DIAGNOSIS — Z780 Asymptomatic menopausal state: Secondary | ICD-10-CM

## 2022-05-30 DIAGNOSIS — Z13820 Encounter for screening for osteoporosis: Secondary | ICD-10-CM

## 2022-06-10 DIAGNOSIS — L810 Postinflammatory hyperpigmentation: Secondary | ICD-10-CM | POA: Diagnosis not present

## 2022-06-26 ENCOUNTER — Ambulatory Visit: Payer: Medicare HMO | Admitting: Internal Medicine

## 2022-07-04 ENCOUNTER — Telehealth: Payer: Self-pay | Admitting: Internal Medicine

## 2022-07-04 ENCOUNTER — Telehealth: Payer: Self-pay | Admitting: *Deleted

## 2022-07-04 NOTE — Telephone Encounter (Signed)
Ozempic 1 mg 3 pens patient assistance. Patient is aware.

## 2022-07-04 NOTE — Telephone Encounter (Signed)
Samples are in the small refrigerator on the FP side.

## 2022-07-04 NOTE — Telephone Encounter (Signed)
Pt came in person to pick up medication. Did not see medication in the refrigerator. Let pt know I would send msg back to careteam. Pt stated she would come back either later today or tomorrow for medication.

## 2022-07-09 ENCOUNTER — Telehealth: Payer: Self-pay

## 2022-07-10 NOTE — Progress Notes (Signed)
   07/10/2022  Patient ID: Gerlean Ren, female   DOB: [birth year], 72 y.o.   MRN: [id number]  Subjective/Objective: Patient identified by insurance as failing the following quality measures:  adherence to oral diabetes medications and hypertension and lipid medications.  Medication Adherence and Management -Reviewed medication list and fill history -Patient not on oral diabetes medication (received Ozempic 1mg  through PAP), and A1c controlled at 5.8% -HTN treated with amlodipine 5mg  daily- BP controlled at 128/70, and fill history reflects 99% adherence -HLD treated with atorvastatin 40mg  daily- TC 246 and LDL 74 on 11/8- fill history reflects 99% adherence  Assessment/Plan:  Medication Adherence and Management -Adherence strategy sufficient based on fill history -Recommend increasing atorvastatin to 80mg  daily in an attempt to obtain LDL <70  Lenna Gilford, PharmD, DPLA

## 2022-07-22 ENCOUNTER — Ambulatory Visit (INDEPENDENT_AMBULATORY_CARE_PROVIDER_SITE_OTHER): Payer: Medicare HMO | Admitting: Internal Medicine

## 2022-07-22 ENCOUNTER — Encounter: Payer: Self-pay | Admitting: Internal Medicine

## 2022-07-22 VITALS — BP 140/82 | HR 97 | Temp 98.0°F | Wt 142.5 lb

## 2022-07-22 DIAGNOSIS — I152 Hypertension secondary to endocrine disorders: Secondary | ICD-10-CM | POA: Diagnosis not present

## 2022-07-22 DIAGNOSIS — E1159 Type 2 diabetes mellitus with other circulatory complications: Secondary | ICD-10-CM

## 2022-07-22 DIAGNOSIS — L0231 Cutaneous abscess of buttock: Secondary | ICD-10-CM | POA: Diagnosis not present

## 2022-07-22 DIAGNOSIS — E7800 Pure hypercholesterolemia, unspecified: Secondary | ICD-10-CM | POA: Diagnosis not present

## 2022-07-22 DIAGNOSIS — E1169 Type 2 diabetes mellitus with other specified complication: Secondary | ICD-10-CM

## 2022-07-22 LAB — POCT GLYCOSYLATED HEMOGLOBIN (HGB A1C): Hemoglobin A1C: 5.6 % (ref 4.0–5.6)

## 2022-07-22 NOTE — Assessment & Plan Note (Signed)
Excellent control with an A1c of 5.6.

## 2022-07-22 NOTE — Assessment & Plan Note (Signed)
On atorvastatin 10 mg daily. LDL 74.

## 2022-07-22 NOTE — Progress Notes (Signed)
Established Patient Office Visit     CC/Reason for Visit: 12-month follow-up chronic conditions, discuss acute concern  HPI: Kristie King is a 72 y.o. female who is coming in today for the above mentioned reasons. Past Medical History is significant for: Type 2 diabetes, hypertension, hyperlipidemia, pulmonary sarcoidosis.  Blood pressure is elevated today.  She tells me that she has only been taking amlodipine 2-3 times a week.  She noticed a lump on her right buttock that burst and is now draining.  It was quite painful.   Past Medical/Surgical History: Past Medical History:  Diagnosis Date   AC (acromioclavicular) arthritis 11/13/2016   Allergy    SEASONAL   Cataract    BILATERAL-REMOVED   Cervical radiculopathy at C6 09/10/2016   CKD stage 2 due to type 2 diabetes mellitus (HCC) 04/14/2018   Diabetes mellitus without complication (HCC)    History of lumbar laminectomy 2019   HTN (hypertension) 09/17/2012   Hyperlipidemia    Hyperlipidemia associated with type 2 diabetes mellitus (HCC) 09/17/2012   Hypertension associated with diabetes (HCC) 04/14/2018   Iron deficiency anemia 10/16/2016   Osteoarthritis of spine with radiculopathy, cervical region 08/03/2016   Vitamin D deficiency 10/16/2016    Past Surgical History:  Procedure Laterality Date   BACK SURGERY  03/30/2018   BUNIONECTOMY Right 02/18/2013   BUNIONECTOMY Left 04/01/2013   CATARACTS      Social History:  reports that she quit smoking about 51 years ago. Her smoking use included cigarettes. She has never used smokeless tobacco. She reports current alcohol use. She reports that she does not use drugs.  Allergies: No Known Allergies  Family History:  Family History  Problem Relation Age of Onset   Stroke Mother    Stroke Father    Hyperlipidemia Sister    Diabetes Brother    Hypertension Brother    Diabetes Sister    Hypertension Sister    Stroke Maternal Grandfather    Colon cancer Neg Hx    Stomach  cancer Neg Hx    Esophageal cancer Neg Hx    Pancreatic cancer Neg Hx    Colon polyps Neg Hx    Ulcerative colitis Neg Hx      Current Outpatient Medications:    albuterol (VENTOLIN HFA) 108 (90 Base) MCG/ACT inhaler, Inhale 2 puffs into the lungs every 6 (six) hours as needed for wheezing or shortness of breath., Disp: 8 g, Rfl: 2   amLODipine (NORVASC) 5 MG tablet, Take 1 tablet (5 mg total) by mouth daily., Disp: 90 tablet, Rfl: 1   atorvastatin (LIPITOR) 40 MG tablet, TAKE 1 TABLET BY MOUTH EVERY DAY, Disp: 90 tablet, Rfl: 1   ipratropium (ATROVENT) 0.03 % nasal spray, Place 2 sprays into both nostrils every 12 (twelve) hours., Disp: 30 mL, Rfl: 2   Semaglutide (OZEMPIC, 1 MG/DOSE, [place]), Inject 1 mg into the skin every 7 (seven) days., Disp: , Rfl:    traZODone (DESYREL) 50 MG tablet, TAKE 0.5-1 TABLETS BY MOUTH AT BEDTIME AS NEEDED FOR SLEEP., Disp: 90 tablet, Rfl: 1  Review of Systems:  Negative unless indicated in HPI.   Physical Exam: Vitals:   07/22/22 0859 07/22/22 0901  BP: (!) 147/81 (!) 140/82  Pulse: 97   Temp: 98 F (36.7 C)   TempSrc: Oral   SpO2: 97%   Weight: 142 lb 8 oz (64.6 kg)     Body mass index is 24.85 kg/m.   Physical Exam Vitals reviewed.  Constitutional:      Appearance: Normal appearance.  HENT:     Head: Normocephalic and atraumatic.  Eyes:     Conjunctiva/sclera: Conjunctivae normal.     Pupils: Pupils are equal, round, and reactive to light.  Cardiovascular:     Rate and Rhythm: Normal rate and regular rhythm.  Pulmonary:     Effort: Pulmonary effort is normal.     Breath sounds: Normal breath sounds.  Skin:    General: Skin is warm and dry.          Comments: Small draining abscess superior right gluteal cleft  Neurological:     General: No focal deficit present.     Mental Status: She is alert and oriented to person, place, and time.  Psychiatric:        Mood and Affect: Mood normal.        Behavior: Behavior normal.         Thought Content: Thought content normal.        Judgment: Judgment normal.      Impression and Plan:  Type 2 diabetes mellitus with other specified complication, without long-term current use of insulin (HCC) Assessment & Plan: Excellent control with an A1c of 5.6.  Orders: -     POCT glycosylated hemoglobin (Hb A1C)  Hypertension associated with diabetes [facility]) Assessment & Plan: Not well controlled. We discussed importance of adherence to medication. She will start taking amlodipine daily. F/u in 3 months.   Hypercholesteremia Assessment & Plan: On atorvastatin 10 mg daily. LDL 74.   Abscess of buttock  -Already draining spontaneously, advised Vaseline to speed up healing process.   Time spent:31 minutes spent on today's visit reviewing chart, interviewing and examining patient and formulating plan of care.  We have discussed importance of adherence to medical therapy and care for her abscess.     Chaya Jan, MD [facility] at [facility]

## 2022-07-22 NOTE — Assessment & Plan Note (Signed)
Not well controlled. We discussed importance of adherence to medication. She will start taking amlodipine daily. F/u in 3 months.

## 2022-08-06 ENCOUNTER — Other Ambulatory Visit (HOSPITAL_BASED_OUTPATIENT_CLINIC_OR_DEPARTMENT_OTHER): Payer: Self-pay | Admitting: Pulmonary Disease

## 2022-08-07 ENCOUNTER — Ambulatory Visit (INDEPENDENT_AMBULATORY_CARE_PROVIDER_SITE_OTHER): Payer: Medicare HMO | Admitting: Internal Medicine

## 2022-08-07 ENCOUNTER — Encounter: Payer: Self-pay | Admitting: Internal Medicine

## 2022-08-07 VITALS — BP 130/80 | HR 65 | Temp 98.0°F | Wt 139.6 lb

## 2022-08-07 DIAGNOSIS — M545 Low back pain, unspecified: Secondary | ICD-10-CM | POA: Diagnosis not present

## 2022-08-07 DIAGNOSIS — I10 Essential (primary) hypertension: Secondary | ICD-10-CM | POA: Diagnosis not present

## 2022-08-07 MED ORDER — AMLODIPINE BESYLATE 5 MG PO TABS: 5.0000 mg | ORAL_TABLET | Freq: Every day | ORAL | 1 refills | Status: DC

## 2022-08-07 NOTE — Progress Notes (Signed)
Established Patient Office Visit     CC/Reason for Visit: Medication refills, low back pain  HPI: Kristie King is a 72 y.o. female who is coming in today for the above mentioned reasons. Past Medical History is significant for: Hypertension, hyperlipidemia, type 2 diabetes, sarcoidosis.  5 days ago she bent over and had a sharp twinge in her lower back.  Ever since then has been having some pain.  She has some leftover prednisone and has been taking 5 mg a day which has helped tremendously.  She has no radiculopathy, no bowel or bladder incontinence, no saddle anesthesia.   Past Medical/Surgical History: Past Medical History:  Diagnosis Date   AC (acromioclavicular) arthritis 11/13/2016   Allergy    SEASONAL   Cataract    BILATERAL-REMOVED   Cervical radiculopathy at C6 09/10/2016   CKD stage 2 due to type 2 diabetes mellitus (HCC) 04/14/2018   Diabetes mellitus without complication (HCC)    History of lumbar laminectomy 2019   HTN (hypertension) 09/17/2012   Hyperlipidemia    Hyperlipidemia associated with type 2 diabetes mellitus (HCC) 09/17/2012   Hypertension associated with diabetes (HCC) 04/14/2018   Iron deficiency anemia 10/16/2016   Osteoarthritis of spine with radiculopathy, cervical region 08/03/2016   Vitamin D deficiency 10/16/2016    Past Surgical History:  Procedure Laterality Date   BACK SURGERY  03/30/2018   BUNIONECTOMY Right 02/18/2013   BUNIONECTOMY Left 04/01/2013   CATARACTS      Social History:  reports that she quit smoking about 51 years ago. Her smoking use included cigarettes. She has never used smokeless tobacco. She reports current alcohol use. She reports that she does not use drugs.  Allergies: No Known Allergies  Family History:  Family History  Problem Relation Age of Onset   Stroke Mother    Stroke Father    Hyperlipidemia Sister    Diabetes Brother    Hypertension Brother    Diabetes Sister    Hypertension Sister    Stroke  Maternal Grandfather    Colon cancer Neg Hx    Stomach cancer Neg Hx    Esophageal cancer Neg Hx    Pancreatic cancer Neg Hx    Colon polyps Neg Hx    Ulcerative colitis Neg Hx      Current Outpatient Medications:    albuterol (VENTOLIN HFA) 108 (90 Base) MCG/ACT inhaler, Inhale 2 puffs into the lungs every 6 (six) hours as needed for wheezing or shortness of breath., Disp: 8 g, Rfl: 2   atorvastatin (LIPITOR) 40 MG tablet, TAKE 1 TABLET BY MOUTH EVERY DAY, Disp: 90 tablet, Rfl: 1   ipratropium (ATROVENT) 0.03 % nasal spray, Place 2 sprays into both nostrils every 12 (twelve) hours., Disp: 30 mL, Rfl: 2   Semaglutide (OZEMPIC, 1 MG/DOSE, ), Inject 1 mg into the skin every 7 (seven) days., Disp: , Rfl:    traZODone (DESYREL) 50 MG tablet, TAKE 0.5-1 TABLETS BY MOUTH AT BEDTIME AS NEEDED FOR SLEEP., Disp: 90 tablet, Rfl: 1   amLODipine (NORVASC) 5 MG tablet, Take 1 tablet (5 mg total) by mouth daily., Disp: 90 tablet, Rfl: 1  Review of Systems:  Negative unless indicated in HPI.   Physical Exam: Vitals:   08/07/22 1307  BP: 130/80  Pulse: 65  Temp: 98 F (36.7 C)  TempSrc: Oral  SpO2: 100%  Weight: 139 lb 9.6 oz (63.3 kg)    Body mass index is 24.34 kg/m.   Physical Exam  Vitals reviewed.  Constitutional:      Appearance: Normal appearance.  HENT:     Head: Normocephalic and atraumatic.  Eyes:     Conjunctiva/sclera: Conjunctivae normal.     Pupils: Pupils are equal, round, and reactive to light.  Skin:    General: Skin is warm and dry.  Neurological:     General: No focal deficit present.     Mental Status: She is alert and oriented to person, place, and time.  Psychiatric:        Mood and Affect: Mood normal.        Behavior: Behavior normal.        Thought Content: Thought content normal.        Judgment: Judgment normal.      Impression and Plan:  Acute bilateral low back pain without sciatica  Primary hypertension -     amLODIPine Besylate; Take 1  tablet (5 mg total) by mouth daily.  Dispense: 90 tablet; Refill: 1  -Suspect this is simply musculoskeletal pain given lack of red flag signs/symptoms. -Advised icing, as needed NSAIDs, back stretches, local massage therapy. -If no improvement in 3 to 4 weeks, can consider referral to physical therapy.    Time spent:23 minutes reviewing chart, interviewing and examining patient and formulating plan of care.     Chaya Jan, MD [facility] at [facility]

## 2022-09-19 ENCOUNTER — Encounter: Payer: Self-pay | Admitting: Internal Medicine

## 2022-09-19 ENCOUNTER — Ambulatory Visit (INDEPENDENT_AMBULATORY_CARE_PROVIDER_SITE_OTHER): Payer: Medicare HMO | Admitting: Internal Medicine

## 2022-09-19 VITALS — BP 120/60 | HR 67 | Temp 97.7°F | Ht 63.5 in | Wt 139.2 lb

## 2022-09-19 DIAGNOSIS — M545 Low back pain, unspecified: Secondary | ICD-10-CM

## 2022-09-19 NOTE — Progress Notes (Signed)
Established Patient Office Visit     CC/Reason for Visit: Continued low back pain  HPI: Kristie King is a 72 y.o. female who is coming in today for the above mentioned reasons.  She was seen in June for the same.  She has no focal neurologic deficits, no saddle anesthesia, no bowel or bladder incontinence, no radiculopathy.  She has been using Tiger balm, and over-the-counter Tylenol/NSAIDs without much relief.  Past Medical/Surgical History: Past Medical History:  Diagnosis Date   AC (acromioclavicular) arthritis 11/13/2016   Allergy    SEASONAL   Cataract    BILATERAL-REMOVED   Cervical radiculopathy at C6 09/10/2016   CKD stage 2 due to type 2 diabetes mellitus (HCC) 04/14/2018   Diabetes mellitus without complication (HCC)    History of lumbar laminectomy 2019   HTN (hypertension) 09/17/2012   Hyperlipidemia    Hyperlipidemia associated with type 2 diabetes mellitus (HCC) 09/17/2012   Hypertension associated with diabetes (HCC) 04/14/2018   Iron deficiency anemia 10/16/2016   Osteoarthritis of spine with radiculopathy, cervical region 08/03/2016   Vitamin D deficiency 10/16/2016    Past Surgical History:  Procedure Laterality Date   BACK SURGERY  03/30/2018   BUNIONECTOMY Right 02/18/2013   BUNIONECTOMY Left 04/01/2013   CATARACTS      Social History:  reports that she quit smoking about 51 years ago. Her smoking use included cigarettes. She started smoking about 61 years ago. She has never used smokeless tobacco. She reports current alcohol use. She reports that she does not use drugs.  Allergies: No Known Allergies  Family History:  Family History  Problem Relation Age of Onset   Stroke Mother    Stroke Father    Hyperlipidemia Sister    Diabetes Brother    Hypertension Brother    Diabetes Sister    Hypertension Sister    Stroke Maternal Grandfather    Colon cancer Neg Hx    Stomach cancer Neg Hx    Esophageal cancer Neg Hx    Pancreatic cancer Neg Hx     Colon polyps Neg Hx    Ulcerative colitis Neg Hx      Current Outpatient Medications:    albuterol (VENTOLIN HFA) 108 (90 Base) MCG/ACT inhaler, Inhale 2 puffs into the lungs every 6 (six) hours as needed for wheezing or shortness of breath., Disp: 8 g, Rfl: 2   amLODipine (NORVASC) 5 MG tablet, Take 1 tablet (5 mg total) by mouth daily., Disp: 90 tablet, Rfl: 1   atorvastatin (LIPITOR) 40 MG tablet, TAKE 1 TABLET BY MOUTH EVERY DAY, Disp: 90 tablet, Rfl: 1   ipratropium (ATROVENT) 0.03 % nasal spray, PLACE 2 SPRAYS INTO BOTH NOSTRILS EVERY 12 (TWELVE) HOURS., Disp: 90 mL, Rfl: 0   Semaglutide (OZEMPIC, 1 MG/DOSE, [place]), Inject 1 mg into the skin every 7 (seven) days., Disp: , Rfl:    traZODone (DESYREL) 50 MG tablet, TAKE 0.5-1 TABLETS BY MOUTH AT BEDTIME AS NEEDED FOR SLEEP., Disp: 90 tablet, Rfl: 1  Review of Systems:  Negative unless indicated in HPI.   Physical Exam: Vitals:   09/19/22 0706  BP: 120/60  Pulse: 67  Temp: 97.7 F (36.5 C)  TempSrc: Oral  SpO2: 97%  Weight: 139 lb 3.2 oz (63.1 kg)  Height: 5' 3.5" (1.613 m)    Body mass index is 24.27 kg/m.   Physical Exam Vitals reviewed.  Constitutional:      Appearance: Normal appearance.  HENT:  Head: Normocephalic and atraumatic.  Eyes:     Conjunctiva/sclera: Conjunctivae normal.     Pupils: Pupils are equal, round, and reactive to light.  Skin:    General: Skin is warm and dry.  Neurological:     General: No focal deficit present.     Mental Status: She is alert and oriented to person, place, and time.  Psychiatric:        Mood and Affect: Mood normal.        Behavior: Behavior normal.        Thought Content: Thought content normal.        Judgment: Judgment normal.      Impression and Plan:  Acute bilateral low back pain without sciatica -     Ambulatory referral to Physical Therapy   -I still think this is likely musculoskeletal. -She has tried over-the-counter pain relievers and ointments  without much relief.  She has also been faithfully doing the back stretches she was given last visit. -Refer to physical therapy.  Time spent:22 minutes reviewing chart, interviewing and examining patient and formulating plan of care.     Chaya Jan, MD [facility] at [facility]

## 2022-09-23 ENCOUNTER — Ambulatory Visit: Payer: Medicare HMO | Admitting: Internal Medicine

## 2022-09-23 DIAGNOSIS — H2513 Age-related nuclear cataract, bilateral: Secondary | ICD-10-CM | POA: Diagnosis not present

## 2022-09-23 LAB — HM DIABETES EYE EXAM

## 2022-10-08 ENCOUNTER — Encounter: Payer: Self-pay | Admitting: Internal Medicine

## 2022-10-08 ENCOUNTER — Telehealth: Payer: Self-pay | Admitting: *Deleted

## 2022-10-08 NOTE — Telephone Encounter (Signed)
Error

## 2022-10-08 NOTE — Telephone Encounter (Signed)
Left message on machine for patient that her samples of Ozempic are available for pick up.  4, 1 mg pens.

## 2022-10-27 ENCOUNTER — Other Ambulatory Visit: Payer: Self-pay | Admitting: Internal Medicine

## 2022-10-27 DIAGNOSIS — G4700 Insomnia, unspecified: Secondary | ICD-10-CM

## 2022-10-31 ENCOUNTER — Encounter (HOSPITAL_BASED_OUTPATIENT_CLINIC_OR_DEPARTMENT_OTHER): Payer: Self-pay | Admitting: Physical Therapy

## 2022-10-31 ENCOUNTER — Ambulatory Visit (HOSPITAL_BASED_OUTPATIENT_CLINIC_OR_DEPARTMENT_OTHER): Payer: Medicare HMO | Attending: Internal Medicine | Admitting: Physical Therapy

## 2022-10-31 DIAGNOSIS — M5459 Other low back pain: Secondary | ICD-10-CM

## 2022-10-31 DIAGNOSIS — R29898 Other symptoms and signs involving the musculoskeletal system: Secondary | ICD-10-CM

## 2022-10-31 DIAGNOSIS — M6281 Muscle weakness (generalized): Secondary | ICD-10-CM

## 2022-10-31 DIAGNOSIS — M545 Low back pain, unspecified: Secondary | ICD-10-CM | POA: Diagnosis not present

## 2022-10-31 DIAGNOSIS — R2689 Other abnormalities of gait and mobility: Secondary | ICD-10-CM | POA: Diagnosis not present

## 2022-10-31 NOTE — Therapy (Signed)
OUTPATIENT PHYSICAL THERAPY THORACOLUMBAR EVALUATION   Patient Name: Kristie King MRN: [id number] DOB:1950-04-03, 72 y.o., female Today's Date: 10/31/2022  END OF SESSION:  PT End of Session - 10/31/22 1359     Visit Number 1    Number of Visits 1    Date for PT Re-Evaluation 10/31/22    Authorization Type [employer]    PT Start Time 1359    PT Stop Time 1426    PT Time Calculation (min) 27 min    Activity Tolerance Patient tolerated treatment well    Behavior During Therapy [facility] for tasks assessed/performed             Past Medical History:  Diagnosis Date   AC (acromioclavicular) arthritis 11/13/2016   Allergy    SEASONAL   Cataract    BILATERAL-REMOVED   Cervical radiculopathy at C6 09/10/2016   CKD stage 2 due to type 2 diabetes mellitus (HCC) 04/14/2018   Diabetes mellitus without complication (HCC)    History of lumbar laminectomy 2019   HTN (hypertension) 09/17/2012   Hyperlipidemia    Hyperlipidemia associated with type 2 diabetes mellitus (HCC) 09/17/2012   Hypertension associated with diabetes (HCC) 04/14/2018   Iron deficiency anemia 10/16/2016   Osteoarthritis of spine with radiculopathy, cervical region 08/03/2016   Vitamin D deficiency 10/16/2016   Past Surgical History:  Procedure Laterality Date   BACK SURGERY  03/30/2018   BUNIONECTOMY Right 02/18/2013   BUNIONECTOMY Left 04/01/2013   CATARACTS     Patient Active Problem List   Diagnosis Date Noted   Acute upper respiratory infection 09/27/2021   Chronic maxillary sinusitis 06/08/2021   Laryngopharyngeal reflux (LPR) 06/08/2021   Hx of adenomatous colonic polyps 05/01/2021   Chronic cough 05/01/2021   Sore throat 05/01/2021   Hypercholesteremia 05/24/2020   Anemia of chronic disease 05/18/2020   Hx of decompressive lumbar laminectomy 04/17/2018   Hypertension associated with diabetes (HCC) 04/14/2018   Sarcoidosis of lung (HCC) 04/14/2018   Allergic rhinitis 04/14/2018   Lumbar spinal  stenosis 01/21/2017   AC (acromioclavicular) arthritis 11/13/2016   Vitamin D deficiency 10/16/2016   Cervical radiculopathy at C6 09/10/2016   Osteoarthritis of spine with radiculopathy, cervical region 08/03/2016   Synovitis of knee 09/01/2013   Type 2 diabetes mellitus with other specified complication (HCC)     PCP: Philip Aspen, Limmie Patricia, MD  REFERRING PROVIDER: Philip Aspen, Limmie Patricia, MD  REFERRING DIAG: M54.50 (ICD-10-CM) - Acute bilateral low back pain without sciatica  Rationale for Evaluation and Treatment: Rehabilitation  THERAPY DIAG:  Other low back pain  Muscle weakness (generalized)  Other abnormalities of gait and mobility  Other symptoms and signs involving the musculoskeletal system  ONSET DATE: 6 weeks  SUBJECTIVE:  SUBJECTIVE STATEMENT: Patient states she has been working out every day. Back was bothering her but is now feeling better due to ointments and exercises. Feels she hurt her back doing a zumba move.   PERTINENT HISTORY:  Hx back pain, hx laminectomy, DM, HTN  PAIN:  Are you having pain? No  PRECAUTIONS: None  WEIGHT BEARING RESTRICTIONS: No  FALLS:  Has patient fallen in last 6 months? No   OCCUPATION: retired  PLOF: Independent  PATIENT GOALS: get checked over    OBJECTIVE: (objective measures from initial evaluation unless otherwise dated)   PATIENT SURVEYS:  One time visit  SCREENING FOR RED FLAGS: Bowel or bladder incontinence: No Spinal tumors: No Cauda equina syndrome: No Compression fracture: No Abdominal aneurysm: No  COGNITION: Overall cognitive status: Within functional limits for tasks assessed     SENSATION: WFL   POSTURE: No Significant postural limitations  PALPATION: No TTP  LUMBAR ROM:   AROM eval   Flexion 0% function  Extension 0% function  Right lateral flexion 0% function  Left lateral flexion 0% function  Right rotation   Left rotation    (Blank rows = not tested) * = pain/symptoms  LOWER EXTREMITY ROM:   WFL for tasks assessed  LOWER EXTREMITY MMT:    MMT Right eval Left eval  Hip flexion 5 5  Hip extension 4 4  Hip abduction 4+ 4+  Hip adduction    Hip internal rotation    Hip external rotation    Knee flexion 5 5  Knee extension 5 5  Ankle dorsiflexion 5 5  Ankle plantarflexion    Ankle inversion    Ankle eversion     (Blank rows = not tested) * = pain/symptoms    GAIT: Distance walked: 100 feet Assistive device utilized: None Level of assistance: Complete Independence Comments: WFL  TODAY'S TREATMENT:                                                                                                                              DATE:  10/31/22  Eval and HEP   PATIENT EDUCATION:  Education details: Patient educated on exam findings, POC, scope of PT, HEP, and returning to PT if needed. Person educated: Patient Education method: Explanation, Demonstration, and Handouts Education comprehension: verbalized understanding, returned demonstration, verbal cues required, and tactile cues required  HOME EXERCISE PROGRAM: Access Code: [email or web address] URL: [email or web address] Date: 10/31/2022 - Squat  - 1 x daily - 7 x weekly - 3 sets - 10 reps - Standard Lunge  - 1 x daily - 7 x weekly - 3 sets - 10 reps - Runner's Step Up/Down  - 1 x daily - 7 x weekly - 3 sets - 10 reps - Sidelying Hip Abduction  - 1 x daily - 7 x weekly - 3 sets - 10 reps - Prone Hip Extension  - 1 x daily - 7 x weekly - 3 sets - 10 reps  ASSESSMENT:  CLINICAL IMPRESSION: Patient a 72 y.o. y.o. female who was seen today for physical therapy evaluation and treatment for LBP. Patient symptoms have resolved with regular exercise and use of topical ointments. Patient wishing to  get checked over to see if any deficits remain. Patient overall moves very well with mild limitation in glute strength. Provided patient with some general strength and glute exercises. Patient educated on returning if needed. Patient does not require additional PT services at this time.   OBJECTIVE IMPAIRMENTS: decreased strength and increased muscle spasms.   ACTIVITY LIMITATIONS:  exercise  PARTICIPATION LIMITATIONS: yard work  PERSONAL FACTORS: 1 comorbidity: hx back pain  are also affecting patient's functional outcome.   REHAB POTENTIAL: Good  CLINICAL DECISION MAKING: Stable/uncomplicated  EVALUATION COMPLEXITY: Low   GOALS: Goals reviewed with patient? Yes  SHORT TERM GOALS: Target date: 10/31/22  Patient will be independent with HEP in order to improve functional outcomes. Baseline:  Goal status: MET     PLAN:  PT FREQUENCY: one time visit  PT DURATION:  1 sessions  PLANNED INTERVENTIONS: Therapeutic exercises, Therapeutic activity, Neuromuscular re-education, Balance training, Gait training, Patient/Family education, Joint manipulation, Joint mobilization, Stair training, Orthotic/Fit training, DME instructions, Aquatic Therapy, Dry Needling, Electrical stimulation, Spinal manipulation, Spinal mobilization, Cryotherapy, Moist heat, Compression bandaging, scar mobilization, Splintting, Taping, Traction, Ultrasound, Ionotophoresis 4mg /ml Dexamethasone, and Manual therapy  PLAN FOR NEXT SESSION: n/a   Wyman Songster, PT 10/31/2022, 2:33 PM

## 2022-11-29 ENCOUNTER — Encounter: Payer: Self-pay | Admitting: Family Medicine

## 2022-11-29 ENCOUNTER — Telehealth (INDEPENDENT_AMBULATORY_CARE_PROVIDER_SITE_OTHER): Payer: Medicare HMO | Admitting: Family Medicine

## 2022-11-29 DIAGNOSIS — U071 COVID-19: Secondary | ICD-10-CM | POA: Diagnosis not present

## 2022-11-29 DIAGNOSIS — R0981 Nasal congestion: Secondary | ICD-10-CM | POA: Diagnosis not present

## 2022-11-29 MED ORDER — NIRMATRELVIR/RITONAVIR (PAXLOVID)TABLET: 3.0000 | ORAL_TABLET | Freq: Two times a day (BID) | ORAL | 0 refills | Status: AC

## 2022-11-29 NOTE — Progress Notes (Signed)
Subjective:    Patient ID: Kristie King, female    DOB: 1950/11/10, 72 y.o.   MRN: [id number]  HPI Virtual Visit via Video Note  I connected with the patient on 11/29/22 at 11:00 AM EDT by a video enabled telemedicine application and verified that I am speaking with the correct person using two identifiers.  Location patient: home Location provider:work or home office Persons participating in the virtual visit: patient, provider  I discussed the limitations of evaluation and management by telemedicine and the availability of in person appointments. The patient expressed understanding and agreed to proceed.   HPI: Here for 2 days of runny nose, dry cough, and fatigue. No fever or SOB. She tested positive for Covid yesterday. Of note she attended a football game last weekend with friends, and some of them have also tested positive.    ROS: See pertinent positives and negatives per HPI.  Past Medical History:  Diagnosis Date   AC (acromioclavicular) arthritis 11/13/2016   Allergy    SEASONAL   Cataract    BILATERAL-REMOVED   Cervical radiculopathy at C6 09/10/2016   CKD stage 2 due to type 2 diabetes mellitus (HCC) 04/14/2018   Diabetes mellitus without complication (HCC)    History of lumbar laminectomy 2019   HTN (hypertension) 09/17/2012   Hyperlipidemia    Hyperlipidemia associated with type 2 diabetes mellitus (HCC) 09/17/2012   Hypertension associated with diabetes (HCC) 04/14/2018   Iron deficiency anemia 10/16/2016   Osteoarthritis of spine with radiculopathy, cervical region 08/03/2016   Vitamin D deficiency 10/16/2016    Past Surgical History:  Procedure Laterality Date   BACK SURGERY  03/30/2018   BUNIONECTOMY Right 02/18/2013   BUNIONECTOMY Left 04/01/2013   CATARACTS      Family History  Problem Relation Age of Onset   Stroke Mother    Stroke Father    Hyperlipidemia Sister    Diabetes Brother    Hypertension Brother    Diabetes Sister    Hypertension Sister     Stroke Maternal Grandfather    Colon cancer Neg Hx    Stomach cancer Neg Hx    Esophageal cancer Neg Hx    Pancreatic cancer Neg Hx    Colon polyps Neg Hx    Ulcerative colitis Neg Hx      Current Outpatient Medications:    albuterol (VENTOLIN HFA) 108 (90 Base) MCG/ACT inhaler, Inhale 2 puffs into the lungs every 6 (six) hours as needed for wheezing or shortness of breath., Disp: 8 g, Rfl: 2   amLODipine (NORVASC) 5 MG tablet, Take 1 tablet (5 mg total) by mouth daily., Disp: 90 tablet, Rfl: 1   atorvastatin (LIPITOR) 40 MG tablet, TAKE 1 TABLET BY MOUTH EVERY DAY, Disp: 90 tablet, Rfl: 1   ipratropium (ATROVENT) 0.03 % nasal spray, PLACE 2 SPRAYS INTO BOTH NOSTRILS EVERY 12 (TWELVE) HOURS., Disp: 90 mL, Rfl: 0   Semaglutide (OZEMPIC, 1 MG/DOSE, [place]), Inject 1 mg into the skin every 7 (seven) days., Disp: , Rfl:    traZODone (DESYREL) 50 MG tablet, TAKE 1/2 TO 1 TABLET BY MOUTH AT BEDTIME AS NEEDED FOR SLEEP, Disp: 90 tablet, Rfl: 1  EXAM:  VITALS per patient if applicable:  GENERAL: alert, oriented, appears well and in no acute distress  HEENT: atraumatic, conjunttiva clear, no obvious abnormalities on inspection of external nose and ears  NECK: normal movements of the head and neck  LUNGS: on inspection no signs of respiratory distress, breathing rate appears  normal, no obvious gross SOB, gasping or wheezing  CV: no obvious cyanosis  MS: moves all visible extremities without noticeable abnormality  PSYCH/NEURO: pleasant and cooperative, no obvious depression or anxiety, speech and thought processing grossly intact  ASSESSMENT AND PLAN: Covid infection. Treat with 5 days of Paxlovid. Follow up as needed. Gershon Crane, MD  Discussed the following assessment and plan:  No diagnosis found.     I discussed the assessment and treatment plan with the patient. The patient was provided an opportunity to ask questions and all were answered. The patient agreed with the plan  and demonstrated an understanding of the instructions.   The patient was advised to call back or seek an in-person evaluation if the symptoms worsen or if the condition fails to improve as anticipated.      Review of Systems     Objective:   Physical Exam        Assessment & Plan:

## 2022-12-12 ENCOUNTER — Ambulatory Visit: Payer: Medicare HMO | Admitting: Family Medicine

## 2022-12-12 DIAGNOSIS — Z0000 Encounter for general adult medical examination without abnormal findings: Secondary | ICD-10-CM | POA: Diagnosis not present

## 2022-12-12 NOTE — Patient Instructions (Signed)
I really enjoyed getting to talk with you today! I am available on Tuesdays and Thursdays for virtual visits if you have any questions or concerns, or if I can be of any further assistance.   CHECKLIST FROM ANNUAL WELLNESS VISIT:  -Follow up (please call to schedule if not scheduled after visit):   -yearly for annual wellness visit with primary care office  Here is a list of your preventive care/health maintenance measures and the plan for each if any are due:  PLAN For any measures below that may be due:  -get flu shot and check to see if you had the shingrix (shingles vaccines) - please update Dr. Ardyth Harps -get the covid updated vaccine 3 months after recent infection -schedule in office physical for foot exam and labs -please let us know who your eye doctor is so that we can request your diabetic eye exam report, thanks!  Health Maintenance  Topic Date Due   FOOT EXAM  Never done   Zoster Vaccines- Shingrix (2 of 2) 02/27/2022   OPHTHALMOLOGY EXAM  07/11/2022   INFLUENZA VACCINE  10/03/2022   Diabetic kidney evaluation - eGFR measurement  10/10/2022   Diabetic kidney evaluation - Urine ACR  10/10/2022   COVID-19 Vaccine (5 - 2023-24 season) 11/03/2022   HEMOGLOBIN A1C  01/22/2023   Medicare Annual Wellness (AWV)  12/12/2023   MAMMOGRAM  02/22/2024   Colonoscopy  09/28/2026   DEXA SCAN  05/30/2027   DTaP/Tdap/Td (2 - Td or Tdap) 03/13/2031   Pneumonia Vaccine 55+ Years old  Completed   Hepatitis C Screening  Completed   HPV VACCINES  Aged Out    -See a dentist at least yearly  -Get your eyes checked and then per your eye specialist's recommendations  -Other issues addressed today:   -I have included below further information regarding a healthy whole foods based diet, physical activity guidelines for adults, stress management and opportunities for social connections. I hope you find this information useful.    -----------------------------------------------------------------------------------------------------------------------------------------------------------------------------------------------------------------------------------------------------------  NUTRITION: -eat real food: lots of colorful vegetables (half the plate) and fruits -5-7 servings of vegetables and fruits per day (fresh or steamed is best), exp. 2 servings of vegetables with lunch and dinner and 2 servings of fruit per day. Berries and greens such as kale and collards are great choices.  -consume on a regular basis: whole grains (make sure first ingredient on label contains the word "whole"), fresh fruits, fish, nuts, seeds, healthy oils (such as olive oil, avocado oil, grape seed oil) -may eat small amounts of dairy and lean meat on occasion, but avoid processed meats such as ham, bacon, lunch meat, etc. -drink water -try to avoid fast food and pre-packaged foods, processed meat -most experts advise limiting sodium to < 2300mg  per day, should limit further is any chronic conditions such as high blood pressure, heart disease, diabetes, etc. The American Heart Association advised that < 1500mg  is is ideal -try to avoid foods that contain any ingredients with names you do not recognize  -try to avoid sugar/sweets (except for the natural sugar that occurs in fresh fruit) -try to avoid sweet drinks -try to avoid white rice, white bread, pasta (unless whole grain), white or yellow potatoes  EXERCISE GUIDELINES FOR ADULTS: -if you wish to increase your physical activity, do so gradually and with the approval of your doctor -STOP and seek medical care immediately if you have any chest pain, chest discomfort or trouble breathing when starting or increasing exercise  -move and  stretch your body, legs, feet and arms when sitting for long periods -Physical activity guidelines for optimal health in adults: -least 150 minutes per week of  aerobic exercise (can talk, but not sing) once approved by your doctor, 20-30 minutes of sustained activity or two 10 minute episodes of sustained activity every day.  -resistance training at least 2 days per week if approved by your doctor -balance exercises 3+ days per week:   Stand somewhere where you have something sturdy to hold onto if you lose balance.    1) lift up on toes, start with 5x per day and work up to 20x   2) stand and lift on leg straight out to the side so that foot is a few inches of the floor, start with 5x each side and work up to 20x each side   3) stand on one foot, start with 5 seconds each side and work up to 20 seconds on each side  If you need ideas or help with getting more active:  -Silver sneakers https://tools.silversneakers.com  -Walk with a Doc: http://www.duncan-williams.com/  -try to include resistance (weight lifting/strength building) and balance exercises twice per week: or the following link for ideas: http://castillo-powell.com/  BuyDucts.dk  STRESS MANAGEMENT: -can try meditating, or just sitting quietly with deep breathing while intentionally relaxing all parts of your body for 5 minutes daily -if you need further help with stress, anxiety or depression please follow up with your primary doctor or contact the wonderful folks at [facility]: [phone]  SOCIAL CONNECTIONS: -options in [place] if you wish to engage in more social and exercise related activities:  -Silver sneakers https://tools.silversneakers.com  -Walk with a Doc: http://www.duncan-williams.com/  -Check out the [facility] Active Adults 50+ section on the [place] [employer] (hiking clubs, book clubs, cards and games, chess, exercise classes, aquatic classes and much more) - see the website for  details: https://www.[place].gov/departments/parks-recreation/active-adults50  -YouTube has lots of exercise videos for different ages and abilities as well  -[facility] (a variety of indoor and outdoor inperson activities for adults). [phone]. [address]  -Virtual Online Classes (a variety of topics): see seniorplanet.org or call 4755939293  -consider volunteering at a school, hospice center, church, senior center or elsewhere

## 2022-12-12 NOTE — Progress Notes (Signed)
PATIENT CHECK-IN and HEALTH RISK ASSESSMENT QUESTIONNAIRE:  -completed by phone/video for upcoming Medicare Preventive Visit  Pre-Visit Check-in: 1)Vitals (height, wt, BP, etc) - record in vitals section for visit on day of visit Request home vitals (wt, BP, etc.) and enter into vitals, THEN update Vital Signs SmartPhrase below at the top of the HPI. See below.  2)Review and Update Medications, Allergies PMH, Surgeries, Social history in Epic 3)Hospitalizations in the last year with date/reason? no  4)Review and Update Care Team (patient's specialists) in Epic 5) Complete PHQ9 in Epic  6) Complete Fall Screening in Epic 7)Review all Health Maintenance Due and order under PCP if not done.  8)Medicare Wellness Questionnaire: Answer theses question about your habits: Do you drink alcohol? 1 drink occasionally Have you ever smoked? Remote as a teenager - not since How many packs a day do/did you smoke? na Do you use smokeless tobacco?no Do you use an illicit drugs?no Sexually active, no concerns for STIs Do you exercises? Works out 4 days per week - goes to [employer], does zumba, line dancing Married husband also work Reports eat healthy for the most part, tries to avoid processed foods and sweets, prepares her own foods  Answer theses question about you: Can you perform most household chores? yes Do you find it hard to follow a conversation in a noisy room? no Do you often ask people to speak up or repeat themselves?no Do you feel that you have a problem with memory?no Do you balance your checkbook and or bank acounts? yes Do you feel safe at home? yes Last dentist visit? Goes on a regular basis - has visit next week Do you need assistance with any of the following: Please note if so  - none  Driving?  Feeding yourself?  Getting from bed to chair?  Getting to the toilet?  Bathing or showering?  Dressing yourself?  Managing money?  Climbing a flight of stairs  Preparing  meals?  Do you have Advanced Directives in place (Living Will, Healthcare Power or Attorney)?  Yes   Last eye Exam and location? Had cataract surgery, sees eye doctor on regular basis - had eye exam a few months ago[facility]   Do you currently use prescribed or non-prescribed narcotic or opioid pain medications? no  ----------------------------------------------------------------------------------------------------------------------------------------------------------------------------------------------------------------------  Because this visit was a virtual/telehealth visit, some criteria may be missing or patient reported. Any vitals not documented were not able to be obtained and vitals that have been documented are patient reported.    MEDICARE ANNUAL PREVENTIVE VISIT WITH PROVIDER: (Welcome to Medicare, initial annual wellness or annual wellness exam)  Virtual Visit via Video Note  I connected with Kristie King on 12/12/22 by a video enabled telemedicine application and verified that I am speaking with the correct person using two identifiers.  Location patient: home Location provider:work or home office Persons participating in the virtual visit: patient, provider  Concerns and/or follow up today: had covid a few weeks ago, all over that - feels fine now.    See HM section in Epic for other details of completed HM.    ROS: negative for report of fevers, unintentional weight loss, vision changes, vision loss, hearing loss or change, chest pain, sob, hemoptysis, melena, hematochezia, hematuria, falls, bleeding or bruising, thoughts of suicide or self harm, memory loss  Patient-completed extensive health risk assessment - reviewed and discussed with the patient: See Health Risk Assessment completed with patient prior to the visit  either above or in recent phone note. This was reviewed in detailed with the patient today and appropriate recommendations, orders  and referrals were placed as needed per Summary below and patient instructions.   Review of Medical History: -PMH, PSH, Family History and current specialty and care providers reviewed and updated and listed below   Patient Care Team: Philip Aspen, Limmie Patricia, MD as PCP - General (Internal Medicine) Judi Saa, DO as Consulting Physician (Family Medicine) Corine Shelter, MD as Consulting Physician (Pulmonary Disease) Mateo Flow, MD as Consulting Physician (Ophthalmology) [facility] as Consulting Physician Ostergard, Clovis Pu, MD as Consulting Physician (Neurosurgery) Odette Fraction, MD (Inactive) as Consulting Physician (Anesthesiology) Sedalia Muta, PT as Physical Therapist (Physical Therapy)   Past Medical History:  Diagnosis Date   [facility] (acromioclavicular) arthritis 11/13/2016   Allergy    SEASONAL   Cataract    BILATERAL-REMOVED   Cervical radiculopathy at C6 09/10/2016   CKD stage 2 due to type 2 diabetes mellitus (HCC) 04/14/2018   Diabetes mellitus without complication (HCC)    History of lumbar laminectomy 2019   HTN (hypertension) 09/17/2012   Hyperlipidemia    Hyperlipidemia associated with type 2 diabetes mellitus (HCC) 09/17/2012   Hypertension associated with diabetes (HCC) 04/14/2018   Iron deficiency anemia 10/16/2016   Osteoarthritis of spine with radiculopathy, cervical region 08/03/2016   Vitamin D deficiency 10/16/2016    Past Surgical History:  Procedure Laterality Date   BACK SURGERY  03/30/2018   BUNIONECTOMY Right 02/18/2013   BUNIONECTOMY Left 04/01/2013   CATARACTS      Social History   Socioeconomic History   Marital status: Married    Spouse name: Not on file   Number of children: 0   Years of education: Not on file   Highest education level: Master's degree (e.g., MA, MS, MEng, MEd, MSW, MBA)  Occupational History   Occupation: Retired    Associate Professor: [employer]  Tobacco Use   Smoking status: Former    Current  packs/day: 0.00    Types: Cigarettes    Start date: 01/14/1961    Quit date: 01/15/1971    Years since quitting: 51.9   Smokeless tobacco: Never  Vaping Use   Vaping status: Never Used  Substance and Sexual Activity   Alcohol use: Yes    Comment: 09/17/2012 "drink on special occasions only"   Drug use: No   Sexual activity: Yes    Partners: Male  Other Topics Concern   Not on file  Social History Narrative   Increased stress with impending death of brother whom lives in [place]    Social Determinants of Health   Financial Resource Strain: Low Risk  (07/23/2021)   Overall Financial Resource Strain (CARDIA)    Difficulty of Paying Living Expenses: Not hard at all  Food Insecurity: No Food Insecurity (07/23/2021)   Hunger Vital Sign    Worried About Running Out of Food in the Last Year: Never true    Ran Out of Food in the Last Year: Never true  Transportation Needs: No Transportation Needs (07/23/2021)   PRAPARE - Transportation    Lack of Transportation (Medical): No    Lack of Transportation (Non-Medical): No  Physical Activity: Sufficiently Active (07/23/2021)   Exercise Vital Sign    Days of Exercise per Week: 4 days    Minutes of Exercise per Session: 60 min  Stress: No Stress Concern Present (07/23/2021)   [employer] of Occupational Health - Occupational Stress Questionnaire  Feeling of Stress : Not at all  Social Connections: Socially Integrated (07/23/2021)   Social Connection and Isolation Panel [NHANES]    Frequency of Communication with Friends and Family: More than three times a week    Frequency of Social Gatherings with Friends and Family: Twice a week    Attends Religious Services: More than 4 times per year    Active Member of [employer] or Organizations: Yes    Attends Engineer, structural: More than 4 times per year    Marital Status: Married  Catering manager Violence: Unknown (06/04/2021)   Received from [facility], [facility]   HITS     Physically Hurt: Not on file    Insult or Talk Down To: Not on file    Threaten Physical Harm: Not on file    Scream or Curse: Not on file    Family History  Problem Relation Age of Onset   Stroke Mother    Stroke Father    Hyperlipidemia Sister    Diabetes Brother    Hypertension Brother    Diabetes Sister    Hypertension Sister    Stroke Maternal Grandfather    Colon cancer Neg Hx    Stomach cancer Neg Hx    Esophageal cancer Neg Hx    Pancreatic cancer Neg Hx    Colon polyps Neg Hx    Ulcerative colitis Neg Hx     Current Outpatient Medications on File Prior to Visit  Medication Sig Dispense Refill   albuterol (VENTOLIN HFA) 108 (90 Base) MCG/ACT inhaler Inhale 2 puffs into the lungs every 6 (six) hours as needed for wheezing or shortness of breath. 8 g 2   amLODipine (NORVASC) 5 MG tablet Take 1 tablet (5 mg total) by mouth daily. 90 tablet 1   atorvastatin (LIPITOR) 40 MG tablet TAKE 1 TABLET BY MOUTH EVERY DAY 90 tablet 1   ipratropium (ATROVENT) 0.03 % nasal spray PLACE 2 SPRAYS INTO BOTH NOSTRILS EVERY 12 (TWELVE) HOURS. 90 mL 0   Semaglutide (OZEMPIC, 1 MG/DOSE, [place]) Inject 1 mg into the skin every 7 (seven) days.     traZODone (DESYREL) 50 MG tablet TAKE 1/2 TO 1 TABLET BY MOUTH AT BEDTIME AS NEEDED FOR SLEEP 90 tablet 1   No current facility-administered medications on file prior to visit.    No Known Allergies     Physical Exam Vitals requested from patient and listed below if patient had equipment and was able to obtain at home for this virtual visit: There were no vitals filed for this visit. Estimated body mass index is 24.27 kg/m as calculated from the following:   Height as of 09/19/22: 5' 3.5" (1.613 m).   Weight as of 09/19/22: 139 lb 3.2 oz (63.1 kg).  EKG (optional): deferred due to virtual visit  GENERAL: alert, oriented, no acute distress detected, full vision exam deferred due to pandemic and/or virtual encounter  HEENT: atraumatic, conjunttiva  clear, no obvious abnormalities on inspection of external nose and ears  NECK: normal movements of the head and neck  LUNGS: on inspection no signs of respiratory distress, breathing rate appears normal, no obvious gross SOB, gasping or wheezing  CV: no obvious cyanosis  MS: moves all visible extremities without noticeable abnormality  PSYCH/NEURO: pleasant and cooperative, no obvious depression or anxiety, speech and thought processing grossly intact, Cognitive function grossly intact  [employer] Visit from 08/07/2022 in [facility] [place]  PHQ-9 Total Score 1  12/12/2022    3:07 PM 08/07/2022    1:32 PM 07/22/2022    9:13 AM 03/26/2022   10:33 AM 03/12/2022   10:58 AM  Depression screen PHQ 2/9  Decreased Interest 0 0 0 0 0  Down, Depressed, Hopeless 0 0 0 0 0  PHQ - 2 Score 0 0 0 0 0  Altered sleeping  1 0 2 3  Tired, decreased energy  0 0 0 0  Change in appetite  0 0 0 0  Feeling bad or failure about yourself   0 0 0 0  Trouble concentrating  0 0 0 0  Moving slowly or fidgety/restless  0 0 0 0  Suicidal thoughts  0 0 0 0  PHQ-9 Score  1 0 2 3  Difficult doing work/chores  Not difficult at all Not difficult at all Not difficult at all Not difficult at all       07/19/2022   12:08 PM 07/22/2022    9:13 AM 08/07/2022    1:32 PM 12/12/2022    2:28 PM 12/12/2022    3:07 PM  Fall Risk  Falls in the past year? 0 0 0 0 0  Was there an injury with Fall?  0 0 0 0  Fall Risk Category Calculator  0 0 0 0  Patient at Risk for Falls Due to     Other (Comment)  Fall risk Follow up  Falls evaluation completed Falls evaluation completed       SUMMARY AND PLAN:  Encounter for Medicare annual wellness exam    Discussed applicable health maintenance/preventive health measures and advised and referred or ordered per patient preferences: -she plans to do flu shot soon and agrees to update [place] -plans to do covid vaccine 3 months after  recent infection -she is not sure, but thinks she has had the shingles vaccine and agrees to check -she thinks she has had her diabetic eye exam a few months ago, but is unsure of the doctor and agrees to let PCP know -plans to schedule in office physical for diabetic foot exam and get labs that day -had bone density test in march  Health Maintenance  Topic Date Due   FOOT EXAM  Never done   Zoster Vaccines- Shingrix (2 of 2) 02/27/2022   OPHTHALMOLOGY EXAM  07/11/2022   INFLUENZA VACCINE  10/03/2022   Diabetic kidney evaluation - eGFR measurement  10/10/2022   Diabetic kidney evaluation - Urine ACR  10/10/2022   COVID-19 Vaccine (5 - 2023-24 season) 11/03/2022   HEMOGLOBIN A1C  01/22/2023   Medicare Annual Wellness (AWV)  12/12/2023   MAMMOGRAM  02/22/2024   Colonoscopy  09/28/2026   DEXA SCAN  05/30/2027   DTaP/Tdap/Td (2 - Td or Tdap) 03/13/2031   Pneumonia Vaccine 27+ Years old  Completed   Hepatitis C Screening  Completed   HPV VACCINES  Aged [employer] and counseling on the following was provided based on the above review of health and a plan/checklist for the patient, along with additional information discussed, was provided for the patient in the patient instructions :  -Advised and counseled on a healthy lifestyle  -Reviewed patient's current diet. Advised and counseled on a whole foods based healthy diet. A summary of a healthy diet was provided in the Patient Instructions.  -reviewed patient's current physical activity level and discussed exercise guidelines for adults. Congratulated on current exercise habits and encouraged to include muscle building/strength training 2 days per week.  -  Advise yearly dental visits at minimum and regular eye exams -Advised and counseled on alcohol safe limits, risks  Follow up: see patient instructions     Patient Instructions  I really enjoyed getting to talk with you today! I am available on Tuesdays and Thursdays for  virtual visits if you have any questions or concerns, or if I can be of any further assistance.   CHECKLIST FROM ANNUAL WELLNESS VISIT:  -Follow up (please call to schedule if not scheduled after visit):   -yearly for annual wellness visit with primary care office  Here is a list of your preventive care/health maintenance measures and the plan for each if any are due:  PLAN For any measures below that may be due:  -get flu shot and check to see if you had the shingrix (shingles vaccines) - please update Dr. Ardyth Harps -get the covid updated vaccine 3 months after recent infection -schedule in office physical for foot exam and labs -please let us know who your eye doctor is so that we can request your diabetic eye exam report, thanks!  Health Maintenance  Topic Date Due   FOOT EXAM  Never done   Zoster Vaccines- Shingrix (2 of 2) 02/27/2022   OPHTHALMOLOGY EXAM  07/11/2022   INFLUENZA VACCINE  10/03/2022   Diabetic kidney evaluation - eGFR measurement  10/10/2022   Diabetic kidney evaluation - Urine ACR  10/10/2022   COVID-19 Vaccine (5 - 2023-24 season) 11/03/2022   HEMOGLOBIN A1C  01/22/2023   Medicare Annual Wellness (AWV)  12/12/2023   MAMMOGRAM  02/22/2024   Colonoscopy  09/28/2026   DEXA SCAN  05/30/2027   DTaP/Tdap/Td (2 - Td or Tdap) 03/13/2031   Pneumonia Vaccine 58+ Years old  Completed   Hepatitis C Screening  Completed   HPV VACCINES  Aged Out    -See a dentist at least yearly  -Get your eyes checked and then per your eye specialist's recommendations  -Other issues addressed today:   -I have included below further information regarding a healthy whole foods based diet, physical activity guidelines for adults, stress management and opportunities for social connections. I hope you find this information useful.    -----------------------------------------------------------------------------------------------------------------------------------------------------------------------------------------------------------------------------------------------------------  NUTRITION: -eat real food: lots of colorful vegetables (half the plate) and fruits -5-7 servings of vegetables and fruits per day (fresh or steamed is best), exp. 2 servings of vegetables with lunch and dinner and 2 servings of fruit per day. Berries and greens such as kale and collards are great choices.  -consume on a regular basis: whole grains (make sure first ingredient on label contains the word "whole"), fresh fruits, fish, nuts, seeds, healthy oils (such as olive oil, avocado oil, grape seed oil) -may eat small amounts of dairy and lean meat on occasion, but avoid processed meats such as ham, bacon, lunch meat, etc. -drink water -try to avoid fast food and pre-packaged foods, processed meat -most experts advise limiting sodium to < 2300mg  per day, should limit further is any chronic conditions such as high blood pressure, heart disease, diabetes, etc. The American Heart Association advised that < 1500mg  is is ideal -try to avoid foods that contain any ingredients with names you do not recognize  -try to avoid sugar/sweets (except for the natural sugar that occurs in fresh fruit) -try to avoid sweet drinks -try to avoid white rice, white bread, pasta (unless whole grain), white or yellow potatoes  EXERCISE GUIDELINES FOR ADULTS: -if you wish to increase your physical activity, do so gradually  and with the approval of your doctor -STOP and seek medical care immediately if you have any chest pain, chest discomfort or trouble breathing when starting or increasing exercise  -move and stretch your body, legs, feet and arms when sitting for long periods -Physical activity guidelines for optimal health in adults: -least 150 minutes per week of  aerobic exercise (can talk, but not sing) once approved by your doctor, 20-30 minutes of sustained activity or two 10 minute episodes of sustained activity every day.  -resistance training at least 2 days per week if approved by your doctor -balance exercises 3+ days per week:   Stand somewhere where you have something sturdy to hold onto if you lose balance.    1) lift up on toes, start with 5x per day and work up to 20x   2) stand and lift on leg straight out to the side so that foot is a few inches of the floor, start with 5x each side and work up to 20x each side   3) stand on one foot, start with 5 seconds each side and work up to 20 seconds on each side  If you need ideas or help with getting more active:  -Silver sneakers https://tools.silversneakers.com  -Walk with a Doc: http://www.duncan-williams.com/  -try to include resistance (weight lifting/strength building) and balance exercises twice per week: or the following link for ideas: http://castillo-powell.com/  BuyDucts.dk  STRESS MANAGEMENT: -can try meditating, or just sitting quietly with deep breathing while intentionally relaxing all parts of your body for 5 minutes daily -if you need further help with stress, anxiety or depression please follow up with your primary doctor or contact the wonderful folks at [facility]: [phone]  SOCIAL CONNECTIONS: -options in [place] if you wish to engage in more social and exercise related activities:  -Silver sneakers https://tools.silversneakers.com  -Walk with a Doc: http://www.duncan-williams.com/  -Check out the [facility] Active Adults 50+ section on the [place] [employer] (hiking clubs, book clubs, cards and games, chess, exercise classes, aquatic classes and much more) - see the website for  details: https://www.-[place].gov/departments/parks-recreation/active-adults50  -YouTube has lots of exercise videos for different ages and abilities as well  -[facility] (a variety of indoor and outdoor inperson activities for adults). [phone]) [phone]. [address]  -Virtual Online Classes (a variety of topics): see seniorplanet.org or call (765)597-7315  -consider volunteering at a school, hospice center, church, senior center or elsewhere           [name], DO

## 2023-01-02 ENCOUNTER — Ambulatory Visit: Payer: Medicare HMO | Admitting: Internal Medicine

## 2023-01-02 ENCOUNTER — Ambulatory Visit (INDEPENDENT_AMBULATORY_CARE_PROVIDER_SITE_OTHER): Payer: Medicare HMO | Admitting: Internal Medicine

## 2023-01-02 ENCOUNTER — Encounter: Payer: Self-pay | Admitting: Internal Medicine

## 2023-01-02 VITALS — BP 120/80 | HR 70 | Temp 97.7°F | Wt 135.1 lb

## 2023-01-02 DIAGNOSIS — S90221D Contusion of right lesser toe(s) with damage to nail, subsequent encounter: Secondary | ICD-10-CM

## 2023-01-02 DIAGNOSIS — Z23 Encounter for immunization: Secondary | ICD-10-CM | POA: Diagnosis not present

## 2023-01-02 NOTE — Progress Notes (Signed)
Established Patient Office Visit     CC/Reason for Visit: Darkened right big toenail  HPI: Kristie King is a 72 y.o. female who is coming in today for the above mentioned reasons.  For about 8 months she has been noticing her right great toenail is darkened in color.  She does remember hitting that toe against a vacuum cleaner months ago.  She also routinely gets pedicures and paints her toenails.  Past Medical/Surgical History: Past Medical History:  Diagnosis Date   AC (acromioclavicular) arthritis 11/13/2016   Allergy    SEASONAL   Cataract    BILATERAL-REMOVED   Cervical radiculopathy at C6 09/10/2016   CKD stage 2 due to type 2 diabetes mellitus (HCC) 04/14/2018   Diabetes mellitus without complication (HCC)    History of lumbar laminectomy 2019   HTN (hypertension) 09/17/2012   Hyperlipidemia    Hyperlipidemia associated with type 2 diabetes mellitus (HCC) 09/17/2012   Hypertension associated with diabetes (HCC) 04/14/2018   Iron deficiency anemia 10/16/2016   Osteoarthritis of spine with radiculopathy, cervical region 08/03/2016   Vitamin D deficiency 10/16/2016    Past Surgical History:  Procedure Laterality Date   BACK SURGERY  03/30/2018   BUNIONECTOMY Right 02/18/2013   BUNIONECTOMY Left 04/01/2013   CATARACTS      Social History:  reports that she quit smoking about 52 years ago. Her smoking use included cigarettes. She started smoking about 62 years ago. She has never used smokeless tobacco. She reports current alcohol use. She reports that she does not use drugs.  Allergies: No Known Allergies  Family History:  Family History  Problem Relation Age of Onset   Stroke Mother    Stroke Father    Hyperlipidemia Sister    Diabetes Brother    Hypertension Brother    Diabetes Sister    Hypertension Sister    Stroke Maternal Grandfather    Colon cancer Neg Hx    Stomach cancer Neg Hx    Esophageal cancer Neg Hx    Pancreatic cancer Neg Hx    Colon polyps  Neg Hx    Ulcerative colitis Neg Hx      Current Outpatient Medications:    albuterol (VENTOLIN HFA) 108 (90 Base) MCG/ACT inhaler, Inhale 2 puffs into the lungs every 6 (six) hours as needed for wheezing or shortness of breath., Disp: 8 g, Rfl: 2   amLODipine (NORVASC) 5 MG tablet, Take 1 tablet (5 mg total) by mouth daily., Disp: 90 tablet, Rfl: 1   atorvastatin (LIPITOR) 40 MG tablet, TAKE 1 TABLET BY MOUTH EVERY DAY, Disp: 90 tablet, Rfl: 1   ipratropium (ATROVENT) 0.03 % nasal spray, PLACE 2 SPRAYS INTO BOTH NOSTRILS EVERY 12 (TWELVE) HOURS., Disp: 90 mL, Rfl: 0   Semaglutide (OZEMPIC, 1 MG/DOSE, [place]), Inject 1 mg into the skin every 7 (seven) days., Disp: , Rfl:    traZODone (DESYREL) 50 MG tablet, TAKE 1/2 TO 1 TABLET BY MOUTH AT BEDTIME AS NEEDED FOR SLEEP, Disp: 90 tablet, Rfl: 1  Review of Systems:  Negative unless indicated in HPI.   Physical Exam: Vitals:   01/02/23 0801  BP: 120/80  Pulse: 70  Temp: 97.7 F (36.5 C)  TempSrc: Oral  SpO2: 97%  Weight: 135 lb 1.6 oz (61.3 kg)    Body mass index is 23.56 kg/m.   Physical Exam Musculoskeletal:     Comments: Darkened right toenail, no signs of onychomycosis.      Impression and Plan:  Toenail bruise, right, subsequent encounter  Immunization due   -Flu vaccine administered in office today. -Suspect toenail discoloration due to bruise or more likely from nail polish.  She does not appear to have onychomycosis.  Have advised to allow nails a break from nail polish and particularly dark colors.  Time spent:20 minutes reviewing chart, interviewing and examining patient and formulating plan of care.     Chaya Jan, MD [facility]

## 2023-01-31 ENCOUNTER — Other Ambulatory Visit: Payer: Self-pay | Admitting: Internal Medicine

## 2023-01-31 DIAGNOSIS — E7800 Pure hypercholesterolemia, unspecified: Secondary | ICD-10-CM

## 2023-02-06 ENCOUNTER — Encounter (HOSPITAL_BASED_OUTPATIENT_CLINIC_OR_DEPARTMENT_OTHER): Payer: Self-pay | Admitting: Pulmonary Disease

## 2023-02-06 ENCOUNTER — Ambulatory Visit (HOSPITAL_BASED_OUTPATIENT_CLINIC_OR_DEPARTMENT_OTHER): Payer: Medicare HMO | Admitting: Pulmonary Disease

## 2023-02-06 VITALS — BP 138/72 | HR 71 | Resp 16 | Ht 63.5 in | Wt 138.0 lb

## 2023-02-06 DIAGNOSIS — D869 Sarcoidosis, unspecified: Secondary | ICD-10-CM | POA: Diagnosis not present

## 2023-02-06 NOTE — Patient Instructions (Signed)
Sarcoid Monitoring --Recent chest imaging reviewed. CT Chest 09/2021 Overall improved from acute findings. Plan for Nov 2025 --Annual PFTs.  Plan for Nov/Dec 2025 --Annual ophthalmology exam. UTD 2024. Please have them send records to our office at fax [phone]

## 2023-02-06 NOTE — Progress Notes (Signed)
Subjective:   PATIENT ID: Kristie King GENDER: female DOB: Jul 18, 1950, MRN: [id number]   HPI  Chief Complaint  Patient presents with   Follow-up    Breathing has been fine.    Reason for Visit: Follow-up  [name] is 72 year old female with sarcoidosis, arthritis, HTN, DM2, HLD who presents for follow-up.  Initial consult She intially presented with facial and arm lesions and diagnosed via skin biopsy in the 1970s. She was treated with high dose steroids and methotrexate in the 80s. She was previously followed by Pulmonary at [facility] and [facility]. Last note in 10/22/12 by [facility] and tapered off steroids. Note comments that she had sarcoid involving lung, skin, sinus and larynx. She was seen by Dr. Doristine Section in Dermatology in 2016 and had received injections for facial lesions.  She reports cough for the last 8-9 months. She was seen by GI. Scheduled for ENT next month. Seen by PCP and advised to hold lisinopril for her hypertension. Has started some reflux medication with help in cough. Reports nasal congestion  07/06/21 She recently returned from vacation. Diagnosed with strep throat and started on amoxicillin. She reports cough before this illness remains persistent. When she held her lisinopril but no appreciable difference. Uses flonase nightly. However had nose bleeds x 2. Stopped omeprazole after 10 days   10/08/21 Last month she was treated with Z pack for acute respiratory infection. She has productive cough that has persisted for the last two weeks. No wheezing or shortness of breath. Prior to her illness she felt her cough was doing better and even self discontinued nasal sprays and reflux meds.  05/10/22 Since our last visit she was doing well until the last few weeks when she had increased nasal congestion. Also has had more frequent nosebleeds. This will occur with changes in humidity. Uses humidifier at night. Taking sudaphed as needed and zyrtec. She is working out 4  days a week. Denies fatigue, shortness of breath, cough, wheezing, chest pain. No limitations in activity  02/06/23 Since our last visit she reports overall doing well. Denies fatigue, shortness of breath, cough, wheezing, chest pain. Works out regularly four days a week, primarily cardio with zumba and line dancing  Social History: Retired Scientist, forensic exposures: Asbestosis  Past Medical History:  Diagnosis Date   AC (acromioclavicular) arthritis 11/13/2016   Allergy    SEASONAL   Cataract    BILATERAL-REMOVED   Cervical radiculopathy at C6 09/10/2016   CKD stage 2 due to type 2 diabetes mellitus (HCC) 04/14/2018   Diabetes mellitus without complication (HCC)    History of lumbar laminectomy 2019   HTN (hypertension) 09/17/2012   Hyperlipidemia    Hyperlipidemia associated with type 2 diabetes mellitus (HCC) 09/17/2012   Hypertension associated with diabetes (HCC) 04/14/2018   Iron deficiency anemia 10/16/2016   Osteoarthritis of spine with radiculopathy, cervical region 08/03/2016   Vitamin D deficiency 10/16/2016     Family History  Problem Relation Age of Onset   Stroke Mother    Stroke Father    Hyperlipidemia Sister    Diabetes Brother    Hypertension Brother    Diabetes Sister    Hypertension Sister    Stroke Maternal Grandfather    Colon cancer Neg Hx    Stomach cancer Neg Hx    Esophageal cancer Neg Hx    Pancreatic cancer Neg Hx    Colon polyps Neg Hx    Ulcerative colitis  Neg Hx      Social History   Occupational History   Occupation: Retired    Associate Professor: [employer]  Tobacco Use   Smoking status: Former    Current packs/day: 0.00    Types: Cigarettes    Start date: 01/14/1961    Quit date: 01/15/1971    Years since quitting: 52.0   Smokeless tobacco: Never  Vaping Use   Vaping status: Never Used  Substance and Sexual Activity   Alcohol use: Yes    Comment: 09/17/2012 "drink on special occasions only"   Drug use: No   Sexual activity: Yes     Partners: Male    No Known Allergies   Outpatient Medications Prior to Visit  Medication Sig Dispense Refill   amLODipine (NORVASC) 5 MG tablet Take 1 tablet (5 mg total) by mouth daily. 90 tablet 1   atorvastatin (LIPITOR) 40 MG tablet TAKE 1 TABLET BY MOUTH EVERY DAY 90 tablet 0   Semaglutide (OZEMPIC, 1 MG/DOSE, ) Inject 1 mg into the skin every 7 (seven) days.     traZODone (DESYREL) 50 MG tablet TAKE 1/2 TO 1 TABLET BY MOUTH AT BEDTIME AS NEEDED FOR SLEEP 90 tablet 1   albuterol (VENTOLIN HFA) 108 (90 Base) MCG/ACT inhaler Inhale 2 puffs into the lungs every 6 (six) hours as needed for wheezing or shortness of breath. (Patient not taking: Reported on 02/06/2023) 8 g 2   ipratropium (ATROVENT) 0.03 % nasal spray PLACE 2 SPRAYS INTO BOTH NOSTRILS EVERY 12 (TWELVE) HOURS. (Patient not taking: Reported on 02/06/2023) 90 mL 0   No facility-administered medications prior to visit.    Review of Systems  Constitutional:  Negative for chills, diaphoresis, fever, malaise/fatigue and weight loss.  HENT:  Negative for congestion.   Respiratory:  Negative for cough, hemoptysis, sputum production, shortness of breath and wheezing.   Cardiovascular:  Negative for chest pain, palpitations and leg swelling.     Objective:   Vitals:   02/06/23 0959  BP: 138/72  Pulse: 71  Resp: 16  SpO2: 98%  Weight: 138 lb (62.6 kg)  Height: 5' 3.5" (1.613 m)   SpO2: 98 %  Physical Exam: General: Well-appearing, no acute distress HENT: [place], AT Eyes: EOMI, no scleral icterus Respiratory: Clear to auscultation bilaterally.  No crackles, wheezing or rales Cardiovascular: RRR, -M/R/G, no JVD Extremities:-Edema,-tenderness Neuro: AAO x4, CNII-XII grossly intact Psych: Normal mood, normal affect  Data Reviewed:  Imaging: CXR 05/06/21 - No infiltrate, effusion or edema CT Chest 09/28/21 - Nearly reoslved GGO bilaterally in peribronchovascular distribution that is less pronounced. Perilymphatic  nodularities with largest in LUL. Marland Kitchen  PFT: 07/06/21 FVC 2.16 (91%) FEV1 1.56 (85%) Ratio 69  TLC 103% DLCO 100% Interpretation: Normal PFTs. No obstructive or restrictive defect. No significant bronchodilator response however does not preclude benefit of inhalers.    Labs:    Latest Ref Rng & Units 10/09/2021    8:38 AM 05/11/2021    9:15 AM 04/26/2021   12:04 PM  CBC  WBC 4.0 - 10.5 K/uL 6.6  7.4  5.5   Hemoglobin 12.0 - 15.0 g/dL 69.6  29.5  28.4   Hematocrit 36.0 - 46.0 % 34.5  34.5  35.8   Platelets 150.0 - 400.0 K/uL 326.0  305.0  272.0   Stable anemia      Latest Ref Rng & Units 10/09/2021    8:38 AM 07/20/2021    9:27 AM 07/09/2021    1:48 PM  BMP  Glucose 70 -  99 mg/dL 96  77  220   BUN 6 - 23 mg/dL 25  19  19    Creatinine 0.40 - 1.20 mg/dL 2.54  2.70  6.23   Sodium 135 - 145 mEq/L 140  142  137   Potassium 3.5 - 5.1 mEq/L 3.5  3.6  3.3   Chloride 96 - 112 mEq/L 104  103  103   CO2 19 - 32 mEq/L 27  30  26    Calcium 8.4 - 10.5 mg/dL 9.4  9.5  9.4   Hypokalemia resolved Cr normalized      Latest Ref Rng & Units 10/09/2021    8:38 AM 04/26/2021   12:04 PM 11/10/2020   11:47 AM  Hepatic Function  Total Protein 6.0 - 8.3 g/dL 7.0  7.1  6.8   Albumin 3.5 - 5.2 g/dL 4.1  4.4  3.9   AST 0 - 37 U/L 20  16  20    ALT 0 - 35 U/L 6  5  8    Alk Phosphatase 39 - 117 U/L 47  52  49   Total Bilirubin 0.2 - 1.2 mg/dL 0.6  0.7  0.6   Normal LFTs  Absolute eos 04/26/21 - 100     Assessment & Plan:   Discussion: 72 year old female sarcoidosis, arthritis, HTN, DM2, HLD who presents for routine follow-up. Asymptomatic. No indication for immunosuppression.  We discussed the clinical course of sarcoid and management including serial PFTs, labs, eye exam, and EKG and chest imaging if indicated. If symptoms suggest sarcoid flare in the future, we would manage with steroids +/- biologics.  Pulmonary and skin sarcoidosis --Dx in 1970s via skin bx --Annual ophthalmology exam. [facility]  2024 - No ocular sarcoid involvement. Due 2025 --Routine Dermatology. Last visit 08/2022.  --Labs q6 months. Will defer since patient will obtain at next PCP visit  Sarcoid Monitoring --Recent chest imaging reviewed. CT Chest 09/2021 Overall improved from acute findings. Plan for Nov 2025 --Annual PFTs.  Plan for Nov/Dec 2025 --Annual ophthalmology exam. UTD 2024. Please have them send records to our office at [phone]  Chronic cough- resolved. Likely previously post-viral Normal PFTs --CONTINUE Albuterol TWO puffs in  morning and at night  --Allergies: CONTINUE Zyrtec (cetrizine) 10 mg once a day. Buy over-the counter.                    OK to use atrovent 1 spray per nostril every night if nasal congestion recurs --Anti-reflux trial did not help   Health Maintenance Immunization History  Administered Date(s) Administered   Fluad Quad(high Dose 65+) 11/11/2018, 11/10/2020, 01/09/2022   Fluad Trivalent(High Dose 65+) 01/02/2023   Influenza, High Dose Seasonal PF 12/12/2017   Influenza,inj,Quad PF,6+ Mos 03/09/2015   Influenza-Unspecified 02/07/2017   PFIZER(Purple Top)SARS-COV-2 Vaccination 04/08/2019, 04/29/2019, 11/30/2019, 01/09/2021   PNEUMOCOCCAL CONJUGATE-20 07/09/2021   Pneumococcal Polysaccharide-23 05/18/2020   Tdap 03/12/2021   Zoster Recombinant(Shingrix) 01/02/2022   CT Lung Screen - not qualified. Quit smoking >50 lbs  Orders Placed This Encounter  Procedures   CT Chest Wo Contrast    Standing Status:   Future    Standing Expiration Date:   02/06/2024    Scheduling Instructions:     November 2025. Follow-up in clinic December 2025    Order Specific Question:   Preferred imaging location?    Answer:   [facility]   Pulmonary function test    Standing Status:   Future    Standing Expiration  Date:   02/06/2024    Order Specific Question:   Where should this test be performed?    Answer:   [facility]    Order Specific Question:   Full PFT: includes the  following: basic spirometry, spirometry pre & post bronchodilator, diffusion capacity (DLCO), lung volumes    Answer:   Full PFT   No orders of the defined types were placed in this encounter.   Return in about 1 year (around 02/06/2024) for after PFT.   I have spent a total time of 31-minutes on the day of the appointment including chart review, data review, collecting history, coordinating care and discussing medical diagnosis and plan with the patient/family. Past medical history, allergies, medications were reviewed. Pertinent imaging, labs and tests included in this note have been reviewed and interpreted independently by me.  Concepcion Gillott Mechele Collin, MD [facility] 02/06/2023 10:20 AM  Office Number [phone]

## 2023-02-12 ENCOUNTER — Telehealth: Payer: Self-pay

## 2023-02-12 NOTE — Telephone Encounter (Signed)
Mailed App to patient for Re-Enroll- 2025 for Ozempic (Novo-Nordisk) sent provider portion to office.

## 2023-02-17 NOTE — Telephone Encounter (Signed)
Received Provider portion of APP for [employer] Nordisk/ awaiting patient portion

## 2023-02-21 ENCOUNTER — Encounter: Payer: Self-pay | Admitting: Family Medicine

## 2023-02-21 ENCOUNTER — Ambulatory Visit (INDEPENDENT_AMBULATORY_CARE_PROVIDER_SITE_OTHER): Payer: Medicare HMO | Admitting: Family Medicine

## 2023-02-21 VITALS — BP 128/80 | HR 65 | Resp 16 | Ht 63.5 in | Wt 139.0 lb

## 2023-02-21 DIAGNOSIS — L723 Sebaceous cyst: Secondary | ICD-10-CM | POA: Diagnosis not present

## 2023-02-21 DIAGNOSIS — L02214 Cutaneous abscess of groin: Secondary | ICD-10-CM | POA: Diagnosis not present

## 2023-02-21 DIAGNOSIS — S0083XA Contusion of other part of head, initial encounter: Secondary | ICD-10-CM | POA: Diagnosis not present

## 2023-02-21 MED ORDER — SULFAMETHOXAZOLE-TRIMETHOPRIM 800-160 MG PO TABS: 1.0000 | ORAL_TABLET | Freq: Two times a day (BID) | ORAL | 0 refills | Status: AC

## 2023-02-21 NOTE — Patient Instructions (Addendum)
A few things to remember from today's visit:  Traumatic hematoma of forehead, initial encounter  Abscess of groin, right - Plan: sulfamethoxazole-trimethoprim (BACTRIM DS) 800-160 MG tablet  Sebaceous cyst  Lump on inner right thigh seems to be a sebaceous cyst. Continue monitoring. Forehead hematoma, apply local ice for 5 min at the time a few times per day for 2-3 days. Sitz bath may help abscess to drain. Bactrim DS is an antibiotic. Monitor for fever.  If you need refills for medications you take chronically, please call your pharmacy. Do not use My Chart to request refills or for acute issues that need immediate attention. If you send a my chart message, it may take a few days to be addressed, specially if I am not in the office.  Please be sure medication list is accurate. If a new problem present, please set up appointment sooner than planned today.

## 2023-02-21 NOTE — Progress Notes (Signed)
ACUTE VISIT Chief Complaint  Patient presents with   bump on head    Microwave door hit her forehead about 4 days ago    HPI: Kristie King is a 72 y.o. female with PMH x significant for hypertension, DM 2,  lung sarcoidosis, hyperlipidemia, and vitamin D deficiency, who is here today complaining of a lump on her head as described above.  She reports hitting her head with the door of her microwave while it was open as she was cooking about 4 days ago.  Was able to continue cooking with no limitations afterwards.  Endorses some pain to touch on affected area.  She noticed the bump appear about an hour after hitting her head.  Tried home remedies such as putting alcohol on it and putting a penny on it to relieve pain and reduce the size.  No changes in size and denies headaches, vision changes, focal deficit, or nausea.  Overall no limitations since the time of forehead trauma and has been able to continue her regular routines and activities.   Vaginal "bump": She complains of bump x2  closed to genital area about 4 days ago.  Endorses pain to touch.  Negative for fever, chills, pelvic pain, vaginal discharge, vaginal bleeding, or urinary symptoms. She has not tried OTC medications. She has noticed some improvement.  Review of Systems  Constitutional:  Negative for activity change, appetite change, diaphoresis and unexpected weight change.  HENT:  Negative for nosebleeds and sore throat.   Respiratory:  Negative for shortness of breath.   Cardiovascular:  Negative for chest pain, palpitations and leg swelling.  Gastrointestinal:  Negative for abdominal pain and vomiting.  Genitourinary:  Negative for decreased urine volume, dysuria and hematuria.  Neurological:  Negative for syncope and facial asymmetry.  See other pertinent positives and negatives in HPI.  Current Outpatient Medications on File Prior to Visit  Medication Sig Dispense Refill   amLODipine (NORVASC) 5 MG tablet  Take 1 tablet (5 mg total) by mouth daily. 90 tablet 1   atorvastatin (LIPITOR) 40 MG tablet TAKE 1 TABLET BY MOUTH EVERY DAY 90 tablet 0   Semaglutide (OZEMPIC, 1 MG/DOSE, [place]) Inject 1 mg into the skin every 7 (seven) days.     traZODone (DESYREL) 50 MG tablet TAKE 1/2 TO 1 TABLET BY MOUTH AT BEDTIME AS NEEDED FOR SLEEP 90 tablet 1   No current facility-administered medications on file prior to visit.    Past Medical History:  Diagnosis Date   AC (acromioclavicular) arthritis 11/13/2016   Allergy    SEASONAL   Cataract    BILATERAL-REMOVED   Cervical radiculopathy at C6 09/10/2016   CKD stage 2 due to type 2 diabetes mellitus (HCC) 04/14/2018   Diabetes mellitus without complication (HCC)    History of lumbar laminectomy 2019   HTN (hypertension) 09/17/2012   Hyperlipidemia    Hyperlipidemia associated with type 2 diabetes mellitus (HCC) 09/17/2012   Hypertension associated with diabetes (HCC) 04/14/2018   Iron deficiency anemia 10/16/2016   Osteoarthritis of spine with radiculopathy, cervical region 08/03/2016   Vitamin D deficiency 10/16/2016   No Known Allergies  Social History   Socioeconomic History   Marital status: Married    Spouse name: Not on file   Number of children: 0   Years of education: Not on file   Highest education level: Master's degree (e.g., MA, MS, MEng, MEd, MSW, MBA)  Occupational History   Occupation: Retired    Associate Professor: [employer]  Tobacco Use   Smoking status: Former    Current packs/day: 0.00    Types: Cigarettes    Start date: 01/14/1961    Quit date: 01/15/1971    Years since quitting: 52.1   Smokeless tobacco: Never  Vaping Use   Vaping status: Never Used  Substance and Sexual Activity   Alcohol use: Yes    Comment: 09/17/2012 "drink on special occasions only"   Drug use: No   Sexual activity: Yes    Partners: Male  Other Topics Concern   Not on file  Social History Narrative   Increased stress with impending death of brother whom lives in  [place]    Social Drivers of Health   Financial Resource Strain: Low Risk  (07/23/2021)   Overall Financial Resource Strain (CARDIA)    Difficulty of Paying Living Expenses: Not hard at all  Food Insecurity: No Food Insecurity (07/23/2021)   Hunger Vital Sign    Worried About Running Out of Food in the Last Year: Never true    Ran Out of Food in the Last Year: Never true  Transportation Needs: No Transportation Needs (07/23/2021)   PRAPARE - Administrator, Civil Service (Medical): No    Lack of Transportation (Non-Medical): No  Physical Activity: Sufficiently Active (07/23/2021)   Exercise Vital Sign    Days of Exercise per Week: 4 days    Minutes of Exercise per Session: 60 min  Stress: No Stress Concern Present (07/23/2021)   [employer] of Occupational Health - Occupational Stress Questionnaire    Feeling of Stress : Not at all  Social Connections: Socially Integrated (07/23/2021)   Social Connection and Isolation Panel [NHANES]    Frequency of Communication with Friends and Family: More than three times a week    Frequency of Social Gatherings with Friends and Family: Twice a week    Attends Religious Services: More than 4 times per year    Active Member of [employer] or Organizations: Yes    Attends Banker Meetings: More than 4 times per year    Marital Status: Married   Vitals:   02/21/23 0704  BP: 128/80  Pulse: 65  Resp: 16  SpO2: 98%   Body mass index is 24.24 kg/m.  Physical Exam Vitals and nursing note reviewed. Exam conducted with a chaperone present.  Constitutional:      General: She is not in acute distress.    Appearance: She is well-developed.  HENT:     Head: Normocephalic and atraumatic.   Eyes:     Conjunctiva/sclera: Conjunctivae normal.  Cardiovascular:     Rate and Rhythm: Normal rate and regular rhythm.     Heart sounds: No murmur heard. Pulmonary:     Effort: Pulmonary effort is normal. No respiratory distress.      Breath sounds: Normal breath sounds.  Genitourinary:    Vagina: No vaginal discharge or bleeding.     Comments: Seen skin. Lymphadenopathy:     Cervical: No cervical adenopathy.  Skin:    General: Skin is warm.     Findings: Erythema and lesion present. No rash.       Neurological:     General: No focal deficit present.     Mental Status: She is alert and oriented to person, place, and time.  Psychiatric:        Mood and Affect: Mood and affect normal.   ASSESSMENT AND PLAN:  Ms. CHAMIKA KINSEL was seen today for evaluation of  hematoma.   Traumatic hematoma of forehead, initial encounter Very small and uncomplicated on right side area of forehead she hit door of microwave. I do not think imaging is needed at this time. Continue monitoring for changes. Protect area from the death sunlight. She can apply local ice a few times throughout the day for 2 to 3 days. Instructed about warning signs.  Abscess of groin, right She reports that problem is improving, it seems like it drained spontaneously. Sitz bath may help, recommend daily for a few days. Recommend antibiotic treatment, Bactrim DS twice daily for 7 days. Instructed about warning signs.  -     Sulfamethoxazole-Trimethoprim; Take 1 tablet by mouth 2 (two) times daily for 7 days.  Dispense: 14 tablet; Refill: 0  Sebaceous cyst Right inner tight. Other possible diagnoses discussed. Continue monitoring for changes. Follow-up with PCP as needed.  Return if symptoms worsen or fail to improve.  I, Isabelle Course, acting as a scribe for Samariah Hokenson [place], MD., have documented all relevant documentation on the behalf of Jamael Hoffmann [place], MD, as directed by  Claudene Gatliff [place], MD while in the presence of Corley Maffeo [place], MD.  I, Lanyah Spengler [place], MD, have reviewed all documentation for this visit. The documentation on 02/21/23 for the exam, diagnosis, procedures, and orders are all accurate and complete.  Kyliee Ortego G. [place], MD  [facility]. [place] office.

## 2023-02-24 DIAGNOSIS — Z1231 Encounter for screening mammogram for malignant neoplasm of breast: Secondary | ICD-10-CM | POA: Diagnosis not present

## 2023-02-24 LAB — HM MAMMOGRAPHY

## 2023-03-04 NOTE — Telephone Encounter (Signed)
Following up on pt PAP for NOVO NORDISK (Ozempic),left a HIPAA VM to return call.

## 2023-03-10 ENCOUNTER — Telehealth: Payer: Self-pay

## 2023-03-10 NOTE — Telephone Encounter (Signed)
 Faxed Provider portion [employer] and income doc to [employer] for [employer] 2025

## 2023-03-13 NOTE — Telephone Encounter (Signed)
 Patient approval for [employer]) Novo Nordisk for 2025 until 03/03/24. Letter of approval in media.

## 2023-03-13 NOTE — Telephone Encounter (Signed)
 Left a msg letting pt know she has been Approve on her Novo Nordisk PAP if any question to call bacl.

## 2023-03-26 ENCOUNTER — Telehealth: Payer: Self-pay

## 2023-03-26 NOTE — Telephone Encounter (Signed)
Copied from CRM [id number]. Topic: Clinical - Prescription Issue >> Mar 26, 2023  9:03 AM Florestine Avers wrote: Reason for CRM: Patient says that she was informed by her insurance company that she was approved for her Ozempic, but she has not heard anything back from her provider. Patient would like a call with the status update on this medication and states if she does not answer her phone to please leave her a voicemail.

## 2023-03-27 ENCOUNTER — Other Ambulatory Visit: Payer: Self-pay | Admitting: Internal Medicine

## 2023-03-27 DIAGNOSIS — E1169 Type 2 diabetes mellitus with other specified complication: Secondary | ICD-10-CM

## 2023-03-27 MED ORDER — OZEMPIC (1 MG/DOSE) 2 MG/1.5ML ~~LOC~~ SOPN: 1.0000 mg | PEN_INJECTOR | SUBCUTANEOUS | 2 refills | Status: DC

## 2023-04-01 ENCOUNTER — Ambulatory Visit (INDEPENDENT_AMBULATORY_CARE_PROVIDER_SITE_OTHER): Payer: Medicare HMO | Admitting: Internal Medicine

## 2023-04-01 ENCOUNTER — Encounter: Payer: Self-pay | Admitting: Internal Medicine

## 2023-04-01 VITALS — BP 124/84 | Temp 97.4°F | Wt 140.3 lb

## 2023-04-01 DIAGNOSIS — Z7985 Long-term (current) use of injectable non-insulin antidiabetic drugs: Secondary | ICD-10-CM

## 2023-04-01 DIAGNOSIS — E1169 Type 2 diabetes mellitus with other specified complication: Secondary | ICD-10-CM | POA: Diagnosis not present

## 2023-04-01 DIAGNOSIS — H6122 Impacted cerumen, left ear: Secondary | ICD-10-CM

## 2023-04-01 LAB — POCT GLYCOSYLATED HEMOGLOBIN (HGB A1C): Hemoglobin A1C: 6 % — AB (ref 4.0–5.6)

## 2023-04-01 NOTE — Progress Notes (Signed)
Established Patient Office Visit     CC/Reason for Visit: Discuss acute on chronic concerns  HPI: Kristie King is a 73 y.o. female who is coming in today for the above mentioned reasons. Past Medical History is significant for: Type 2 diabetes that is well-controlled on Ozempic.  She gets this through patient assistance.  Her supply has been delayed this month and she is close to running out.  She woke up last week with sudden hearing loss in her left ear.  She has been applying oil to her ear and feels this has 95% improved her hearing.   Past Medical/Surgical History: Past Medical History:  Diagnosis Date   AC (acromioclavicular) arthritis 11/13/2016   Allergy    SEASONAL   Cataract    BILATERAL-REMOVED   Cervical radiculopathy at C6 09/10/2016   CKD stage 2 due to type 2 diabetes mellitus (HCC) 04/14/2018   Diabetes mellitus without complication (HCC)    History of lumbar laminectomy 2019   HTN (hypertension) 09/17/2012   Hyperlipidemia    Hyperlipidemia associated with type 2 diabetes mellitus (HCC) 09/17/2012   Hypertension associated with diabetes (HCC) 04/14/2018   Iron deficiency anemia 10/16/2016   Osteoarthritis of spine with radiculopathy, cervical region 08/03/2016   Vitamin D deficiency 10/16/2016    Past Surgical History:  Procedure Laterality Date   BACK SURGERY  03/30/2018   BUNIONECTOMY Right 02/18/2013   BUNIONECTOMY Left 04/01/2013   CATARACTS      Social History:  reports that she quit smoking about 52 years ago. Her smoking use included cigarettes. She started smoking about 62 years ago. She has never used smokeless tobacco. She reports current alcohol use. She reports that she does not use drugs.  Allergies: No Known Allergies  Family History:  Family History  Problem Relation Age of Onset   Stroke Mother    Stroke Father    Hyperlipidemia Sister    Diabetes Brother    Hypertension Brother    Diabetes Sister    Hypertension Sister    Stroke  Maternal Grandfather    Colon cancer Neg Hx    Stomach cancer Neg Hx    Esophageal cancer Neg Hx    Pancreatic cancer Neg Hx    Colon polyps Neg Hx    Ulcerative colitis Neg Hx      Current Outpatient Medications:    amLODipine (NORVASC) 5 MG tablet, Take 1 tablet (5 mg total) by mouth daily., Disp: 90 tablet, Rfl: 1   atorvastatin (LIPITOR) 40 MG tablet, TAKE 1 TABLET BY MOUTH EVERY DAY, Disp: 90 tablet, Rfl: 0   Semaglutide, 1 MG/DOSE, (OZEMPIC, 1 MG/DOSE,) 2 MG/1.5ML SOPN, Inject 1 mg into the skin every 7 (seven) days., Disp: 3 mL, Rfl: 2   traZODone (DESYREL) 50 MG tablet, TAKE 1/2 TO 1 TABLET BY MOUTH AT BEDTIME AS NEEDED FOR SLEEP, Disp: 90 tablet, Rfl: 1  Review of Systems:  Negative unless indicated in HPI.   Physical Exam: Vitals:   04/01/23 0918  BP: 124/84  Temp: (!) 97.4 F (36.3 C)  TempSrc: Oral  Weight: 140 lb 4.8 oz (63.6 kg)    Body mass index is 24.46 kg/m.   Physical Exam HENT:     Left Ear: There is impacted cerumen.      Impression and Plan:  Type 2 diabetes mellitus with other specified complication, without long-term current use of insulin (HCC) -     POCT glycosylated hemoglobin (Hb A1C) -  Microalbumin / creatinine urine ratio; Future  Hearing loss of left ear due to cerumen impaction   -A1c of 6.0 demonstrates excellent diabetic control.  She gets her Ozempic through patient assistance but after speaking with pharmacist they are running behind.  Will give her a sample in the meantime. -She has some impacted cerumen in her left ear.  Her hearing has improved after applying oil at home.  She will apply Debrox and return if continues to have issues.  Time spent:31 minutes reviewing chart, interviewing and examining patient and formulating plan of care.     Chaya Jan, MD [facility]

## 2023-04-03 ENCOUNTER — Telehealth: Payer: Self-pay

## 2023-04-03 NOTE — Telephone Encounter (Signed)
LMOM informing Pt that Pt assistance Ozempic is here

## 2023-04-11 NOTE — Progress Notes (Signed)
 Pharmacy Medication Assistance Program Note    04/11/2023  Patient ID: Kristie King, female   DOB: 1950/04/05, 73 y.o.   MRN: [id number]     02/12/2023 03/13/2023  Outreach Medication One  Initial Outreach Date (Medication One) 02/12/2023   Manufacturer Medication One Novo Nordisk Novo Nordisk  Nordisk Drugs Ozempic  Ozempic   Dose of Ozempic  1 mg weekly   Type of Radiographer, Therapeutic Assistance   Date Application Sent to Patient 02/12/2023   Application Items Requested Application;Proof of Income   Date Application Sent to Prescriber 02/12/2023   Name of Prescriber estela Theophilus Andrews   Patient Assistance Determination  Approved  Approval Start Date  03/06/2023  Approval End Date  03/03/2024  Patient Notification Method  Telephone Call  Additional Outreach Contact  Patient  Contacted Patient  Telephone Call     Signature

## 2023-04-24 ENCOUNTER — Ambulatory Visit: Payer: Medicare HMO | Admitting: Internal Medicine

## 2023-04-30 ENCOUNTER — Ambulatory Visit: Payer: Medicare HMO | Admitting: Internal Medicine

## 2023-05-07 ENCOUNTER — Ambulatory Visit: Payer: Medicare HMO | Admitting: Internal Medicine

## 2023-05-13 ENCOUNTER — Ambulatory Visit: Admitting: Internal Medicine

## 2023-06-05 ENCOUNTER — Other Ambulatory Visit: Payer: Self-pay | Admitting: Internal Medicine

## 2023-06-05 DIAGNOSIS — E7800 Pure hypercholesterolemia, unspecified: Secondary | ICD-10-CM

## 2023-06-05 DIAGNOSIS — I10 Essential (primary) hypertension: Secondary | ICD-10-CM

## 2023-06-05 MED ORDER — ATORVASTATIN CALCIUM 40 MG PO TABS: 40.0000 mg | ORAL_TABLET | Freq: Every day | ORAL | 0 refills | Status: DC

## 2023-06-05 MED ORDER — AMLODIPINE BESYLATE 5 MG PO TABS: 5.0000 mg | ORAL_TABLET | Freq: Every day | ORAL | 0 refills | Status: DC

## 2023-06-05 NOTE — Telephone Encounter (Signed)
 Copied from CRM [id number]. Topic: Clinical - Medication Refill >> Jun 05, 2023 10:14 AM [place] J wrote: Most Recent Primary Care Visit:  Provider: Chaya Jan Y  Department: [facility]  Visit Type: ACUTE  Date: 04/01/2023  Medication:  amLODipine (NORVASC) 5 MG atorvastatin (LIPITOR) 40 MG  Has the patient contacted their pharmacy? Yes (Agent: If no, request that the patient contact the pharmacy for the refill. If patient does not wish to contact the pharmacy document the reason why and proceed with request.) (Agent: If yes, when and what did the pharmacy advise?) No refills available at pharmacy.  Is this the correct pharmacy for this prescription? Yes If no, delete pharmacy and type the correct one.  This is the patient's preferred pharmacy:  [id number] [place], [place] - [address] [place] [address] Phone: [phone] Fax: [phone]   Has the prescription been filled recently? No  Is the patient out of the medication? Yes  Has the patient been seen for an appointment in the last year OR does the patient have an upcoming appointment? Yes  Can we respond through MyChart? Yes  Agent: Please be advised that Rx refills may take up to 3 business days. We ask that you follow-up with your pharmacy.

## 2023-07-01 ENCOUNTER — Encounter: Admitting: Internal Medicine

## 2023-07-01 ENCOUNTER — Encounter: Payer: Self-pay | Admitting: Internal Medicine

## 2023-07-01 NOTE — Progress Notes (Signed)
 This encounter was created in error - please disregard.

## 2023-07-16 ENCOUNTER — Telehealth: Payer: Self-pay

## 2023-07-16 NOTE — Progress Notes (Signed)
   07/16/2023  Patient ID: Kristie King, female   DOB: 21-Feb-1951, 73 y.o.   MRN: [id number]  Attempted to contact patient to let her know Ozempic  PAP is ready for pickup. Left HIPAA compliant message for patient to return my call at their convenience.   Carnell Christian, PharmD Clinical Pharmacist [phone]

## 2023-07-23 ENCOUNTER — Ambulatory Visit: Admitting: Internal Medicine

## 2023-07-23 ENCOUNTER — Other Ambulatory Visit: Payer: Self-pay | Admitting: Internal Medicine

## 2023-07-23 DIAGNOSIS — Z1283 Encounter for screening for malignant neoplasm of skin: Secondary | ICD-10-CM

## 2023-07-24 ENCOUNTER — Telehealth: Payer: Self-pay | Admitting: Internal Medicine

## 2023-07-24 NOTE — Telephone Encounter (Unsigned)
 Copied from CRM [id number]. Topic: Referral - Question >> Jul 24, 2023  2:23 PM [place] E wrote: Reason for CRM: Patient called in regarding her dermatology referral. Patient does not want to do the cancer screening, just wants to follow up with a dermatologist. Patient also stated that the provider that this referral was sent to is out of office until January 2026. Questioning if this referral could be sent to another provider. Callback number for patient is [phone].

## 2023-07-24 NOTE — Telephone Encounter (Signed)
 Copied from CRM [id number]. Topic: Referral - Question >> Jul 24, 2023  2:23 PM [place] E wrote: Reason for CRM: Patient called in regarding her dermatology referral. Patient does not want to do the cancer screening, just wants to follow up with a dermatologist. Patient also stated that the provider that this referral was sent to is out of office until January 2026. Questioning if this referral could be sent to another provider. Callback number for patient is [phone].

## 2023-07-24 NOTE — Telephone Encounter (Signed)
 I will forward this to [employer]

## 2023-07-26 IMAGING — CT CT CARDIAC CORONARY ARTERY CALCIUM SCORE
3 series · 13 of 20 positions shown, 15 images · non-contrast
Comparison: None Available.
COMPARISON: None Available.

Addendum:
CLINICAL DATA: This over-read does not include interpretation of
cardiac or coronary anatomy or pathology. The coronary calcium score
interpretation by the cardiologist is attached.
CLINICAL DATA: Cardiovascular disease risk stratification

dyslipidemia
EXAM:
CT Coronary Calcium Score
TECHNIQUE: A gated, non-contrast computed tomography scan of the heart was
performed using 3mm slice thickness. Axial images were analyzed on a
dedicated workstation. Calcium scoring of the coronary arteries was
performed using the Agatston method.

[Series 2: cascseq 2.0 sa36 70% (id) · axial · 0.39mm/px · z∈[-224,-170]mm · 3 of 68 slices shown]
[im 14/68  vessel]
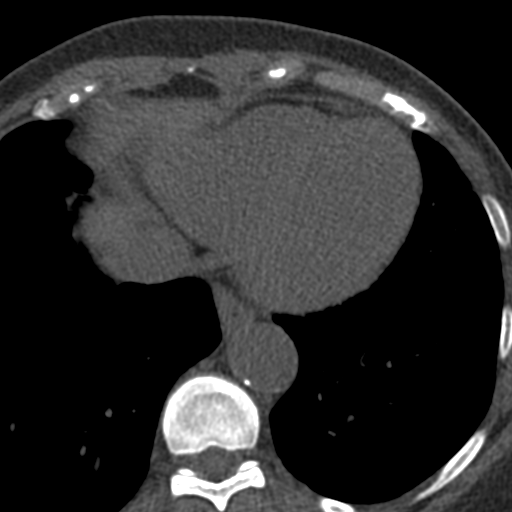
[im 27/68  vessel]
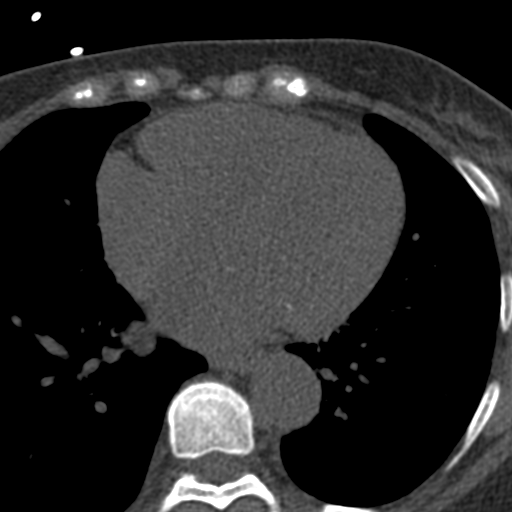
[im 41/68  vessel]
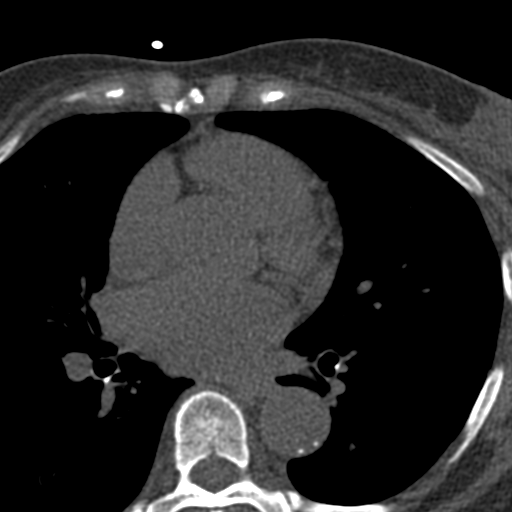

[Series 3: cascseq 2.0 bf37 st · axial · 0.66mm/px · z∈[-228,-140]mm · 5 of 68 slices shown, 7 images]
[im 12/68  vessel]
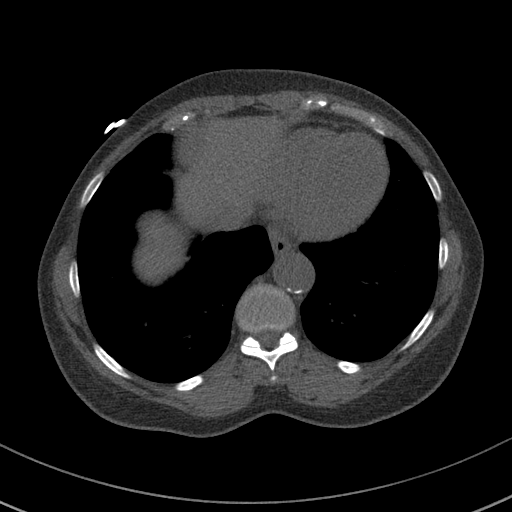
[im 12/68  lung]
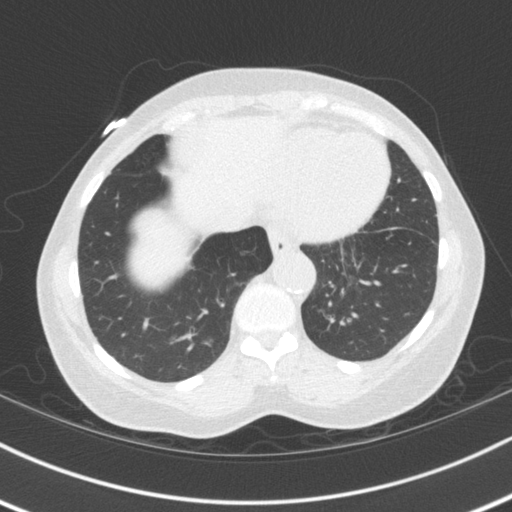
[im 23/68  vessel]
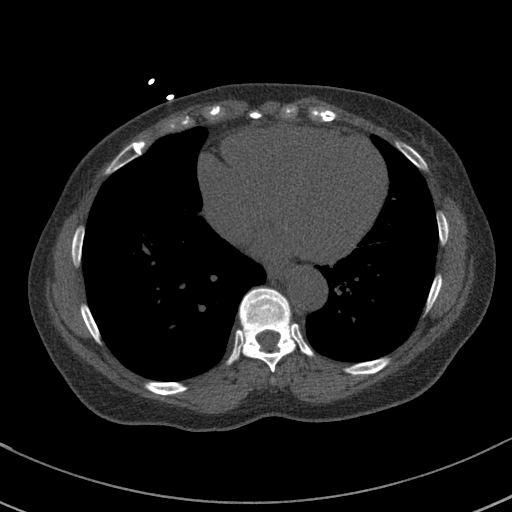
[im 34/68  vessel]
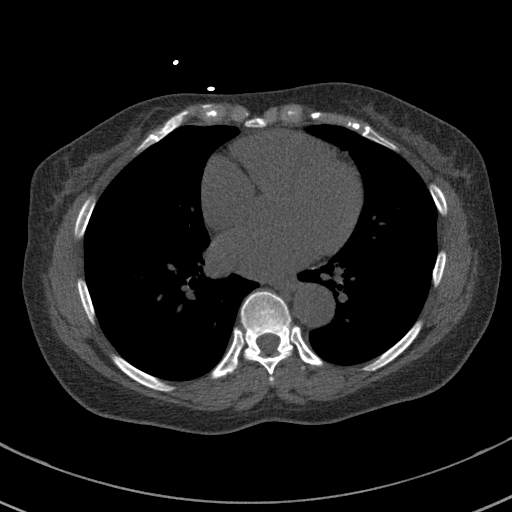
[im 45/68  vessel]
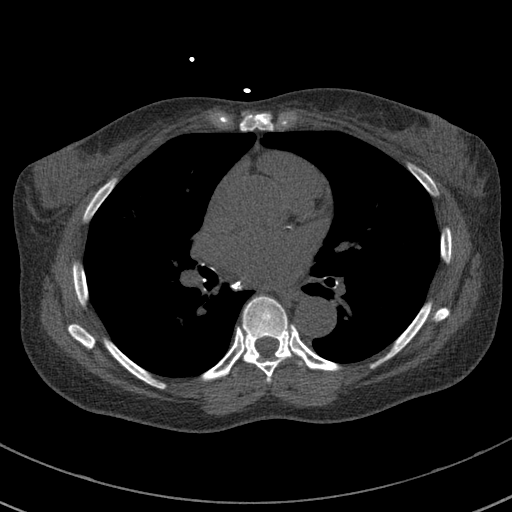
[im 56/68  vessel]
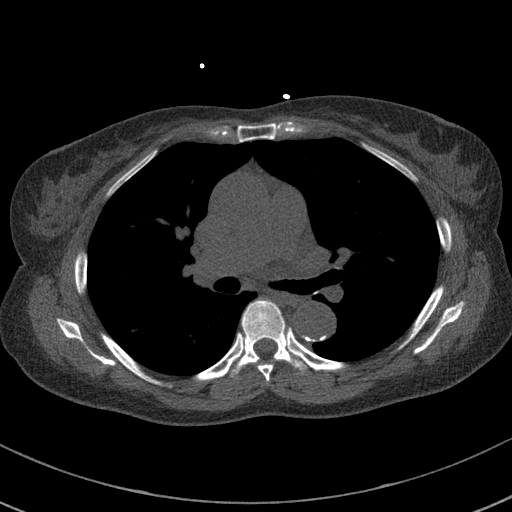
[im 56/68  lung]
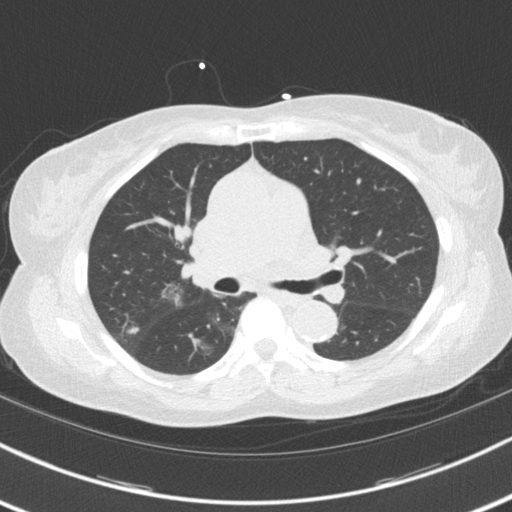

[Series 4: cascseq 2.0 br59 lung · axial · 0.66mm/px · z∈[-228,-140]mm · 5 of 68 slices shown]
[im 12/68  lung]
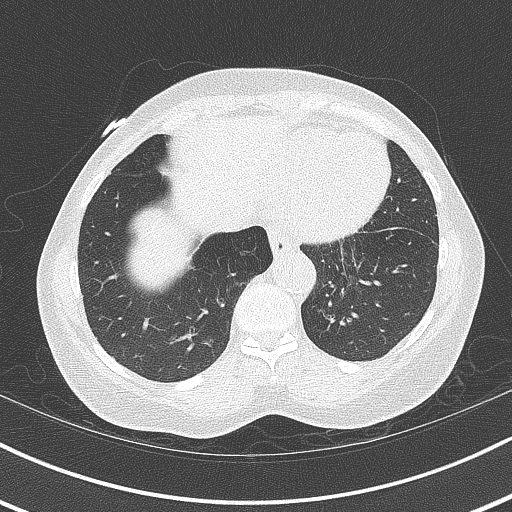
[im 23/68  lung]
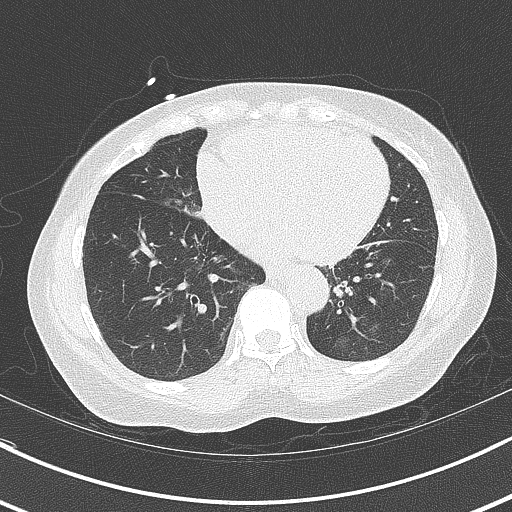
[im 34/68  lung]
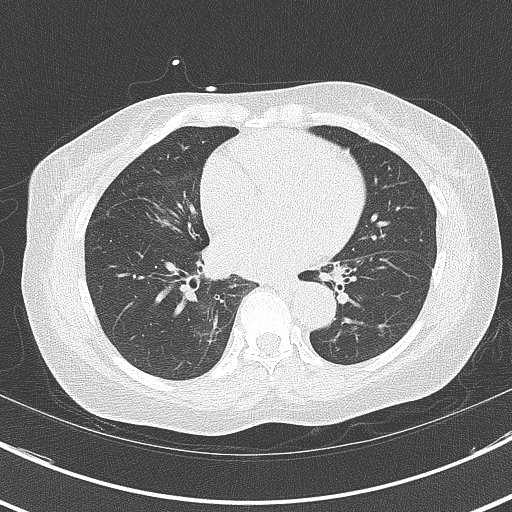
[im 45/68  lung]
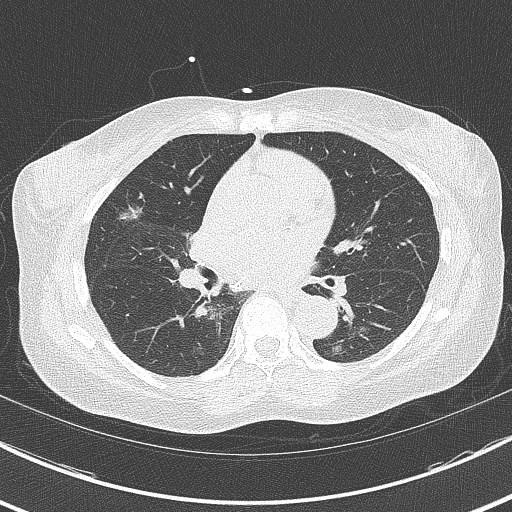
[im 56/68  lung]
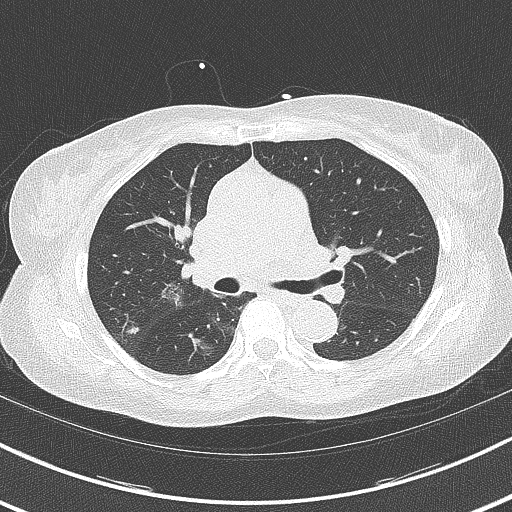

[13 of 20 positions shown; findings below may reference images not displayed]

FINDINGS: Vascular: No significant noncardiac vascular findings.

Mediastinum/Nodes: There are multiple calcified mediastinal lymph
nodes present consistent prior granulomatous disease or underlying
granulomatous condition.

Lungs/Pleura: The entire lungs are not visualized. Beginning on the
very superior most image, area geographic multifocal rounded
airspace opacities are present in the posterior right upper lobe
with the largest measuring approximately 2.7 cm in diameter. There
are multiple areas focal ground-glass and airspace opacity within
the posterior right upper lobe, superior segment of the right lower
lobe and in the inferior and anterior right upper lobe with overall
volume loss in the right middle lobe. Small areas of similar
ground-glass opacity are also present in the superior segment of the
left lower lobe and posterior left lower lobe. While these may be
reflective an inflammatory or infectious process and along with
calcified lymph nodes may reflect some type of underlying chronic
inflammatory conditions such as sarcoidosis, a full evaluation of
the chest would be warranted given findings. Multifocal neoplasm is
felt to be less likely but not completely excluded.

No pleural fluid or pneumothorax identified. No evidence pulmonary
edema.

Upper Abdomen: Visualized upper abdomen is unremarkable.

Musculoskeletal: Visualized bony structures are unremarkable.
IMPRESSION: Multifocal areas of rounded ground-glass and airspace opacities
predominantly in the right upper lobe but also in the right lower
lobe and left lower lobe. There are associated calcified lymph nodes
throughout the mediastinum. This is suggestive of underlying
sarcoidosis and the patient does have sarcoidosis listed in her
problem list. However, there is no prior imaging of the chest for
comparison and further evaluation is recommended with a CT of the
chest with IV contrast. Correlation also suggested with any previous
pulmonary workup of sarcoidosis.
FINDINGS: Coronary Calcium Score:

Left main:

Left anterior descending artery: 0

Left circumflex artery: 0

Right coronary artery: 0

Total:

Percentile: 57th

Pericardium: Normal.

Ascending Aorta: Normal caliber.

Aortic valve: Trivial aortic valve calcification.  AV Salzman score 12.

Non-cardiac: See separate report from [REDACTED].
IMPRESSION: Coronary calcium score of 10.5. This was 57th percentile for age-,
race-, and sex-matched controls.



If CAC=0, it is reasonable to withhold statin therapy and reassess
in 5 to 10 years, as long as higher risk conditions are absent
(diabetes mellitus, family history of premature CHD in first degree
relatives (males <55 years; females <65 years), cigarette smoking,
or LDL >=190 mg/dL).

If CAC is 1 to 99, it is reasonable to initiate statin therapy for
patients >=55 years of age.

If CAC is >=100 or >=75th percentile, it is reasonable to initiate
statin therapy at any age.

Cardiology referral should be considered for patients with CAC
scores >=400 or >=75th percentile.

*5274 AHA/ACC/AACVPR/AAPA/ABC/RINNAH/MYLENE/NAKISHA/Aujla/HESSE/RAPOLTI/VILLA
Guideline on the Management of Blood Cholesterol: A Report of the
American College of Cardiology/American Heart Association Task Force
on Clinical Practice Guidelines. J Am Coll Cardiol.
5872;73(24):7693-7606.

*** End of Addendum ***
FINDINGS: Vascular: No significant noncardiac vascular findings.

Mediastinum/Nodes: There are multiple calcified mediastinal lymph
nodes present consistent prior granulomatous disease or underlying
granulomatous condition.

Lungs/Pleura: The entire lungs are not visualized. Beginning on the
very superior most image, area geographic multifocal rounded
airspace opacities are present in the posterior right upper lobe
with the largest measuring approximately 2.7 cm in diameter. There
are multiple areas focal ground-glass and airspace opacity within
the posterior right upper lobe, superior segment of the right lower
lobe and in the inferior and anterior right upper lobe with overall
volume loss in the right middle lobe. Small areas of similar
ground-glass opacity are also present in the superior segment of the
left lower lobe and posterior left lower lobe. While these may be
reflective an inflammatory or infectious process and along with
calcified lymph nodes may reflect some type of underlying chronic
inflammatory conditions such as sarcoidosis, a full evaluation of
the chest would be warranted given findings. Multifocal neoplasm is
felt to be less likely but not completely excluded.

No pleural fluid or pneumothorax identified. No evidence pulmonary
edema.

Upper Abdomen: Visualized upper abdomen is unremarkable.

Musculoskeletal: Visualized bony structures are unremarkable.
IMPRESSION: Multifocal areas of rounded ground-glass and airspace opacities
predominantly in the right upper lobe but also in the right lower
lobe and left lower lobe. There are associated calcified lymph nodes
throughout the mediastinum. This is suggestive of underlying
sarcoidosis and the patient does have sarcoidosis listed in her
problem list. However, there is no prior imaging of the chest for
comparison and further evaluation is recommended with a CT of the
chest with IV contrast. Correlation also suggested with any previous
pulmonary workup of sarcoidosis.

## 2023-07-30 ENCOUNTER — Telehealth: Payer: Self-pay | Admitting: *Deleted

## 2023-07-30 NOTE — Telephone Encounter (Signed)
 I will forward this message to [place].  The referral is already placed.

## 2023-07-30 NOTE — Telephone Encounter (Signed)
 Copied from CRM [id number]. Topic: Referral - Question >> Jul 30, 2023  1:43 PM Jenice Mitts wrote: Reason for CRM: Patient is calling in because she would like to know if there was another dermatologist she could go to since the once she was referred to isn't going to be in the office until Jan 2026 and she is heading to [place] soon and would like a prescription for her TriLuma

## 2023-08-05 ENCOUNTER — Telehealth: Payer: Self-pay

## 2023-08-05 NOTE — Telephone Encounter (Signed)
 Copied from CRM [id number]. Topic: Referral - Question >> Jul 30, 2023  1:43 PM Jenice Mitts wrote: Reason for CRM: Patient is calling in because she would like to know if there was another dermatologist she could go to since the once she was referred to isn't going to be in the office until Jan 2026 and she is heading to [place] soon and would like a prescription for her Belvin Boys >> Aug 05, 2023 10:39 AM Alysia Jumbo S wrote: Patient calling to check the status of dermatology referral and medication request for TriLuma. She states she is to going out of the country soon and either needs the referral completed or medication called in by her PCP. Patient request a callback as soon as possible.

## 2023-09-16 DIAGNOSIS — D863 Sarcoidosis of skin: Secondary | ICD-10-CM | POA: Diagnosis not present

## 2023-09-16 DIAGNOSIS — I10 Essential (primary) hypertension: Secondary | ICD-10-CM | POA: Diagnosis not present

## 2023-09-16 DIAGNOSIS — Z008 Encounter for other general examination: Secondary | ICD-10-CM | POA: Diagnosis not present

## 2023-09-16 DIAGNOSIS — E1169 Type 2 diabetes mellitus with other specified complication: Secondary | ICD-10-CM | POA: Diagnosis not present

## 2023-09-16 DIAGNOSIS — E785 Hyperlipidemia, unspecified: Secondary | ICD-10-CM | POA: Diagnosis not present

## 2023-09-18 DIAGNOSIS — E785 Hyperlipidemia, unspecified: Secondary | ICD-10-CM | POA: Diagnosis not present

## 2023-09-18 DIAGNOSIS — E1169 Type 2 diabetes mellitus with other specified complication: Secondary | ICD-10-CM | POA: Diagnosis not present

## 2023-09-22 LAB — LIPID PANEL
Cholesterol: 214 — AB (ref 0–200)
HDL: 142 — AB (ref 35–70)
LDL Cholesterol: 64
Triglycerides: 42 (ref 40–160)

## 2023-09-22 LAB — MICROALBUMIN / CREATININE URINE RATIO
Albumin, Urine POC: 11.8
Creatinine, POC: 236 mg/dL
Microalb Creat Ratio: 5

## 2023-09-22 LAB — BASIC METABOLIC PANEL WITH GFR: Creatinine: 0.8 (ref 0.5–1.1)

## 2023-09-22 LAB — COMPREHENSIVE METABOLIC PANEL WITH GFR: eGFR: 83

## 2023-09-23 ENCOUNTER — Encounter: Payer: Self-pay | Admitting: Internal Medicine

## 2023-09-29 ENCOUNTER — Ambulatory Visit: Admitting: Internal Medicine

## 2023-10-01 ENCOUNTER — Encounter: Admitting: Internal Medicine

## 2023-10-01 ENCOUNTER — Encounter: Payer: Self-pay | Admitting: Internal Medicine

## 2023-10-01 VITALS — BP 130/80 | HR 75 | Temp 97.8°F | Wt 143.8 lb

## 2023-10-01 DIAGNOSIS — E1169 Type 2 diabetes mellitus with other specified complication: Secondary | ICD-10-CM

## 2023-10-01 LAB — POCT GLYCOSYLATED HEMOGLOBIN (HGB A1C): Hemoglobin A1C: 5.9 % — AB (ref 4.0–5.6)

## 2023-10-01 NOTE — Progress Notes (Signed)
 This encounter was created in error - please disregard.

## 2023-10-02 ENCOUNTER — Telehealth: Payer: Self-pay | Admitting: *Deleted

## 2023-10-02 NOTE — Telephone Encounter (Signed)
 Copied from CRM [id number]. Topic: Clinical - Medication Question >> Oct 02, 2023 11:45 AM Pinkey ORN wrote: Reason for CRM: Medication Request >> Oct 02, 2023 11:46 AM Pinkey ORN wrote: Patient is calling in, requesting to have a prescription for Furosemide  sent over to the pharmacy. Patient call back number is [phone]

## 2023-10-02 NOTE — Telephone Encounter (Signed)
 Spoke with the patient and she has take furosemide  in the past for edema in her feet and legs.  She does have some swelling now.

## 2023-10-06 MED ORDER — FUROSEMIDE 20 MG PO TABS: 20.0000 mg | ORAL_TABLET | Freq: Every day | ORAL | 0 refills | Status: DC

## 2023-10-06 NOTE — Telephone Encounter (Signed)
 Rx sent. Left detailed message on machine for patient with Dr Dennard recommendation.

## 2023-10-20 ENCOUNTER — Telehealth: Payer: Self-pay | Admitting: *Deleted

## 2023-10-20 NOTE — Telephone Encounter (Signed)
 Left message on machine for patient.   Semaglutide , 1 MG/DOSE, (OZEMPIC , 1 MG/DOSE,) 4 MG/3ML SOPN  4 pens sample from patient assistance is available.

## 2023-10-28 ENCOUNTER — Other Ambulatory Visit: Payer: Self-pay | Admitting: Internal Medicine

## 2023-11-08 ENCOUNTER — Other Ambulatory Visit: Payer: Self-pay | Admitting: Internal Medicine

## 2023-11-08 DIAGNOSIS — I10 Essential (primary) hypertension: Secondary | ICD-10-CM

## 2023-11-15 DIAGNOSIS — E119 Type 2 diabetes mellitus without complications: Secondary | ICD-10-CM | POA: Diagnosis not present

## 2023-11-15 DIAGNOSIS — H2513 Age-related nuclear cataract, bilateral: Secondary | ICD-10-CM | POA: Diagnosis not present

## 2024-01-05 ENCOUNTER — Ambulatory Visit: Admitting: Physician Assistant

## 2024-01-05 ENCOUNTER — Encounter: Payer: Self-pay | Admitting: Physician Assistant

## 2024-01-05 VITALS — BP 126/75

## 2024-01-05 DIAGNOSIS — L810 Postinflammatory hyperpigmentation: Secondary | ICD-10-CM

## 2024-01-05 DIAGNOSIS — D869 Sarcoidosis, unspecified: Secondary | ICD-10-CM

## 2024-01-05 DIAGNOSIS — R238 Other skin changes: Secondary | ICD-10-CM | POA: Diagnosis not present

## 2024-01-05 DIAGNOSIS — L811 Chloasma: Secondary | ICD-10-CM

## 2024-01-05 DIAGNOSIS — L232 Allergic contact dermatitis due to cosmetics: Secondary | ICD-10-CM

## 2024-01-05 MED ORDER — HYDROCORTISONE 2.5 % EX OINT: TOPICAL_OINTMENT | Freq: Two times a day (BID) | CUTANEOUS | 0 refills | Status: DC

## 2024-01-05 NOTE — Patient Instructions (Addendum)
 Allergic contact dermatitis of face with postinflammatory hyperpigmentation and melasma Red, dark spots on both sides of the face with burning sensation. Possible allergic or irritant contact dermatitis. Recent skincare changes may contribute. Continuous Triluma use exacerbates condition. - Switch to Neutrogena sunscreen. - Discontinue current sunscreen and makeup. - Prescribed hydrocortisone 2.5% ointment twice daily for 1-2 weeks. - Hold Triluma for two months. - Discontinue tranexamic acid  pills through winter. - Monitor and report worsening of red spots.  Sarcoidosis Well-managed with infrequent flare-ups. Avoiding prednisone  unless necessary. - Continue current management without prednisone  unless necessary. - Follow up with pulmonologist on December 11th.         Important Information  Due to recent changes in healthcare laws, you may see results of your pathology and/or laboratory studies on MyChart before the doctors have had a chance to review them. We understand that in some cases there may be results that are confusing or concerning to you. Please understand that not all results are received at the same time and often the doctors may need to interpret multiple results in order to provide you with the best plan of care or course of treatment. Therefore, we ask that you please give us  2 business days to thoroughly review all your results before contacting the office for clarification. Should we see a critical lab result, you will be contacted sooner.   If You Need Anything After Your Visit  If you have any questions or concerns for your doctor, please call our main line at [phone] If no one answers, please leave a voicemail as directed and we will return your call as soon as possible. Messages left after 4 pm will be answered the following business day.   You may also send us  a message via MyChart. We typically respond to MyChart messages within 1-2 business days.  For  prescription refills, please ask your pharmacy to contact our office. Our fax number is [phone].  If you have an urgent issue when the clinic is closed that cannot wait until the next business day, you can page your doctor at the number below.    Please note that while we do our best to be available for urgent issues outside of office hours, we are not available 24/7.   If you have an urgent issue and are unable to reach us , you may choose to seek medical care at your doctor's office, retail clinic, urgent care center, or emergency room.  If you have a medical emergency, please immediately call 911 or go to the emergency department. In the event of inclement weather, please call our main line at [phone] for an update on the status of any delays or closures.  Dermatology Medication Tips: Please keep the boxes that topical medications come in in order to help keep track of the instructions about where and how to use these. Pharmacies typically print the medication instructions only on the boxes and not directly on the medication tubes.   If your medication is too expensive, please contact our office at [phone] or send us  a message through MyChart.   We are unable to tell what your co-pay for medications will be in advance as this is different depending on your insurance coverage. However, we may be able to find a substitute medication at lower cost or fill out paperwork to get insurance to cover a needed medication.   If a prior authorization is required to get your medication covered by your insurance company, please allow us  1-2  business days to complete this process.  Drug prices often vary depending on where the prescription is filled and some pharmacies may offer cheaper prices.  The website www.goodrx.com contains coupons for medications through different pharmacies. The prices here do not account for what the cost may be with help from insurance (it may be cheaper with your  insurance), but the website can give you the price if you did not use any insurance.  - You can print the associated coupon and take it with your prescription to the pharmacy.  - You may also stop by our office during regular business hours and pick up a GoodRx coupon card.  - If you need your prescription sent electronically to a different pharmacy, notify our office through [facility] or by phone at [phone]

## 2024-01-05 NOTE — Progress Notes (Signed)
   New Patient Visit   Discussed the use of AI scribe software for clinical note transcription with the patient, who gave verbal consent to proceed.  History of Present Illness Kristie King is a 73 year old female NEW PATIENT with sarcoidosis who presents with skin discoloration and irritation. She was referred by her primary care physician for dermatological evaluation. She had previously seen Dr. Reyes Scales, Dermatologist in [place]. She expressed a preference to be seen by a physician of color.   She has a history of sarcoidosis with periodic flare-ups, managed with intermittent treatments, including past injections. Currently, her sarcoidosis is more manageable, with episodes occurring once or twice a year. She avoids using prednisone  unless absolutely necessary. She does see a pulmonary physician.   She has been experiencing skin discoloration and irritation, particularly during the summer months. She was prescribed Triluma, a topical treatment, and Tranexamic acid , an oral medication, to manage these symptoms. She started using Tranexamic acid  a couple of months ago, taking it at night, and sometimes splitting the dose between morning and night. She has been using both Triluma and Tranexamic acid , and has noticed a new reddish area on her face that resembles a burn and is present on both sides. This area sometimes burns and appears dry and scaly.  She recently switched to a new sunscreen recommended by a salesperson, which she suspects might be contributing to her skin irritation. She has used Neutrogena sunscreen in the past without issues. She also started wearing makeup occasionally, which she did not do before.  She has a follow-up appointment with her pulmonologist scheduled for December 11th. She has not seen the pulmonologist since December of the previous year.     The following portions of the chart were reviewed this encounter and updated as appropriate: medications, allergies,  medical history  Review of Systems:  No other skin or systemic complaints except as noted in HPI or Assessment and Plan.  Objective  Well appearing patient in no apparent distress; mood and affect are within normal limits.   A focused examination was performed of the following areas: Face     Relevant exam findings are noted in the Assessment and Plan.     Assessment & Plan Allergic contact dermatitis of face with postinflammatory hyperpigmentation and melasma Red, dark spots on both sides of the face with burning sensation. Possible allergic or irritant contact dermatitis. Recent skincare changes may contribute. Continuous Triluma use exacerbates condition. - Switch to Neutrogena sunscreen. - Discontinue current sunscreen and makeup. - Prescribed hydrocortisone 2.5% ointment twice daily for 1-2 weeks. - Hold Triluma for two months. - Discontinue tranexamic acid  pills through winter. - Monitor and report worsening of red spots.  Sarcoidosis Well-managed with infrequent flare-ups. Avoiding prednisone  unless necessary. - Continue current management without prednisone  unless necessary. - Follow up with pulmonologist on December 11th.      Return in about 5 months (around 06/04/2024) for Follow up with Dr Alm.  I, Roseline Hutchinson, CMA, am acting as scribe for Haim Hansson K, PA-C .   Documentation: I have reviewed the above documentation for accuracy and completeness, and I agree with the above.  Colbe Viviano K, PA-C

## 2024-01-08 ENCOUNTER — Ambulatory Visit: Payer: Self-pay

## 2024-01-08 ENCOUNTER — Ambulatory Visit: Admitting: Family Medicine

## 2024-01-08 ENCOUNTER — Ambulatory Visit (INDEPENDENT_AMBULATORY_CARE_PROVIDER_SITE_OTHER)

## 2024-01-08 VITALS — BP 110/64 | HR 70 | Temp 97.5°F | Ht 63.5 in | Wt 142.0 lb

## 2024-01-08 DIAGNOSIS — M130 Polyarthritis, unspecified: Secondary | ICD-10-CM

## 2024-01-08 MED ORDER — MELOXICAM 15 MG PO TABS: 15.0000 mg | ORAL_TABLET | Freq: Every day | ORAL | 2 refills | Status: DC

## 2024-01-08 NOTE — Telephone Encounter (Signed)
 Noted

## 2024-01-08 NOTE — Progress Notes (Unsigned)
   Acute Office Visit  Subjective:     Patient ID: Kristie King, female    DOB: 02-May-1950, 73 y.o.   MRN: [id number]  Chief Complaint  Patient presents with   Pain    Patient complains of right shoulder pain x2 weeks, bilateral LE pain x2 weeks, upper and lower back pain x2 weeks with no known injury, tried arthritis gel and green alcohol with no relief    HPI   Review of Systems  All other systems reviewed and are negative.       Objective:    BP 110/64   Pulse 70   Temp (!) 97.5 F (36.4 C) (Oral)   Ht 5' 3.5 (1.613 m)   Wt 142 lb (64.4 kg)   SpO2 100%   BMI 24.76 kg/m  {Vitals History (Optional):23777}  Physical Exam  No results found for any visits on 01/08/24.      Assessment & Plan:   Problem List Items Addressed This Visit   None Visit Diagnoses       Arthritis of multiple sites    -  Primary   Relevant Medications   meloxicam  (MOBIC ) 15 MG tablet       Meds ordered this encounter  Medications   meloxicam  (MOBIC ) 15 MG tablet    Sig: Take 1 tablet (15 mg total) by mouth daily.    Dispense:  30 tablet    Refill:  2    No follow-ups on file.  Heron CHRISTELLA Sharper, MD

## 2024-01-08 NOTE — Telephone Encounter (Signed)
 FYI Only or Action Required?: FYI only for provider: appointment scheduled on 01/08/24.  Patient was last seen in primary care on 10/01/2023 by Theophilus Andrews, Tully GRADE, MD.  Called Nurse Triage reporting Back Pain, Arm Pain, and Leg Pain.  Symptoms began several weeks ago.  Interventions attempted: OTC medications: muscle rub and Rest, hydration, or home remedies.  Symptoms are: unchanged.  Triage Disposition: See PCP When Office is Open (Within 3 Days)  Patient/caregiver understands and will follow disposition?: Yes   Copied from CRM #[id number]. Topic: Clinical - Red Word Triage >> Jan 08, 2024  8:52 AM Lonell PEDLAR wrote: Red Word that prompted transfer to Nurse Triage: Pain in right arm, right leg, and lower back Reason for Disposition  [1] MODERATE pain (e.g., interferes with normal activities) AND [2] present > 3 days  Answer Assessment - Initial Assessment Questions 1. ONSET: When did the pain start?     2 weeks  2. LOCATION: Where is the pain located?     Right arm-including shoulder 3. PAIN: How bad is the pain? (Scale 0-10; or none, mild, moderate, severe)     mild 4. WORK OR EXERCISE: Has there been any recent work or exercise that involved this part of the body?     denies 5. CAUSE: What do you think is causing the arm pain?     Arthritis but not really sure 6. OTHER SYMPTOMS: Do you have any other symptoms? (e.g., neck pain, swelling, rash, fever, numbness, weakness)    mild Left leg pain, mild lower back pain-arm pain is the worse  Protocols used: Arm Pain-A-AH

## 2024-01-12 ENCOUNTER — Ambulatory Visit: Payer: Self-pay | Admitting: Family Medicine

## 2024-01-12 ENCOUNTER — Ambulatory Visit (HOSPITAL_BASED_OUTPATIENT_CLINIC_OR_DEPARTMENT_OTHER)
Admission: RE | Admit: 2024-01-12 | Discharge: 2024-01-12 | Disposition: A | Discharge status: Home / Self Care | Source: Ambulatory Visit | Attending: Pulmonary Disease | Admitting: Pulmonary Disease

## 2024-01-12 DIAGNOSIS — M48062 Spinal stenosis, lumbar region with neurogenic claudication: Secondary | ICD-10-CM

## 2024-01-12 DIAGNOSIS — D869 Sarcoidosis, unspecified: Secondary | ICD-10-CM | POA: Insufficient documentation

## 2024-01-12 DIAGNOSIS — M545 Low back pain, unspecified: Secondary | ICD-10-CM

## 2024-01-19 ENCOUNTER — Ambulatory Visit: Admitting: Internal Medicine

## 2024-01-19 ENCOUNTER — Encounter: Payer: Self-pay | Admitting: Internal Medicine

## 2024-01-19 VITALS — BP 120/78 | Temp 98.5°F | Ht 62.5 in | Wt 142.5 lb

## 2024-01-19 DIAGNOSIS — Z23 Encounter for immunization: Secondary | ICD-10-CM | POA: Diagnosis not present

## 2024-01-19 DIAGNOSIS — E785 Hyperlipidemia, unspecified: Secondary | ICD-10-CM | POA: Diagnosis not present

## 2024-01-19 DIAGNOSIS — E1169 Type 2 diabetes mellitus with other specified complication: Secondary | ICD-10-CM

## 2024-01-19 DIAGNOSIS — E1159 Type 2 diabetes mellitus with other circulatory complications: Secondary | ICD-10-CM | POA: Diagnosis not present

## 2024-01-19 DIAGNOSIS — Z0000 Encounter for general adult medical examination without abnormal findings: Secondary | ICD-10-CM

## 2024-01-19 DIAGNOSIS — I152 Hypertension secondary to endocrine disorders: Secondary | ICD-10-CM

## 2024-01-19 MED ORDER — SEMAGLUTIDE (2 MG/DOSE) 8 MG/3ML ~~LOC~~ SOPN: 2.0000 mg | PEN_INJECTOR | SUBCUTANEOUS | 2 refills | Status: AC

## 2024-01-19 NOTE — Progress Notes (Signed)
 Medicare Wellness question were answered 01/08/24 and 01/19/24.

## 2024-01-19 NOTE — Progress Notes (Signed)
 Established Patient Office Visit     CC/Reason for Visit: Annual preventive exam and subsequent Medicare wellness visit  HPI: Kristie King is a 73 y.o. female who is coming in today for the above mentioned reasons. Past Medical History is significant for: Hypertension, hyperlipidemia, type 2 diabetes and sarcoidosis.  She is feeling well.  Has no acute concerns or complaints.  Is requesting a flu vaccine.  Has routine eye and dental care.  Is due for updated labs.  Is also due for second shingles vaccine and GYN care.  She is requesting an increase in her Ozempic  to 2 mg for more appetite suppression and weight loss.   Past Medical/Surgical History: Past Medical History:  Diagnosis Date   AC (acromioclavicular) arthritis 11/13/2016   Allergy    SEASONAL   Cataract    BILATERAL-REMOVED   Cervical radiculopathy at C6 09/10/2016   CKD stage 2 due to type 2 diabetes mellitus (HCC) 04/14/2018   Diabetes mellitus without complication (HCC)    History of lumbar laminectomy 2019   HTN (hypertension) 09/17/2012   Hyperlipidemia    Hyperlipidemia associated with type 2 diabetes mellitus (HCC) 09/17/2012   Hypertension associated with diabetes (HCC) 04/14/2018   Iron deficiency anemia 10/16/2016   Osteoarthritis of spine with radiculopathy, cervical region 08/03/2016   Vitamin D  deficiency 10/16/2016    Past Surgical History:  Procedure Laterality Date   BACK SURGERY  03/30/2018   BUNIONECTOMY Right 02/18/2013   BUNIONECTOMY Left 04/01/2013   CATARACTS      Social History:  reports that she quit smoking about 53 years ago. Her smoking use included cigarettes. She started smoking about 63 years ago. She has never used smokeless tobacco. She reports current alcohol use. She reports that she does not use drugs.  Allergies: No Known Allergies  Family History:  Family History  Problem Relation Age of Onset   Stroke Mother    Stroke Father    Hyperlipidemia Sister    Diabetes Brother     Hypertension Brother    Diabetes Sister    Hypertension Sister    Stroke Maternal Grandfather    Colon cancer Neg Hx    Stomach cancer Neg Hx    Esophageal cancer Neg Hx    Pancreatic cancer Neg Hx    Colon polyps Neg Hx    Ulcerative colitis Neg Hx      Current Outpatient Medications:    amLODipine  (NORVASC ) 5 MG tablet, TAKE 1 TABLET (5 MG TOTAL) BY MOUTH DAILY., Disp: 90 tablet, Rfl: 0   atorvastatin  (LIPITOR) 40 MG tablet, TAKE 1 TABLET BY MOUTH EVERY DAY, Disp: 90 tablet, Rfl: 0   hydrocortisone 2.5 % ointment, Apply topically 2 (two) times daily. Apply to affected area twice daily until clear., Disp: 30 g, Rfl: 0   meloxicam  (MOBIC ) 15 MG tablet, Take 1 tablet (15 mg total) by mouth daily., Disp: 30 tablet, Rfl: 2   Semaglutide , 2 MG/DOSE, 8 MG/3ML SOPN, Inject 2 mg as directed once a week., Disp: 3 mL, Rfl: 2   traZODone  (DESYREL ) 50 MG tablet, TAKE 1/2 TO 1 TABLET BY MOUTH AT BEDTIME AS NEEDED FOR SLEEP, Disp: 90 tablet, Rfl: 1  Review of Systems:  Negative unless indicated in HPI.   Physical Exam: Vitals:   01/19/24 1309 01/19/24 1334  BP: 120/80 120/78  Temp: 98.5 F (36.9 C)   TempSrc: Oral   Weight: 142 lb 8 oz (64.6 kg)   Height: 5' 2.5 (1.588  m)     Body mass index is 25.65 kg/m.   Physical Exam Vitals reviewed.  Constitutional:      General: She is not in acute distress.    Appearance: Normal appearance. She is not ill-appearing, toxic-appearing or diaphoretic.  HENT:     Head: Normocephalic.     Right Ear: Tympanic membrane, ear canal and external ear normal. There is no impacted cerumen.     Left Ear: Tympanic membrane, ear canal and external ear normal. There is no impacted cerumen.     Nose: Nose normal.     Mouth/Throat:     Mouth: Mucous membranes are moist.     Pharynx: Oropharynx is clear. No oropharyngeal exudate or posterior oropharyngeal erythema.  Eyes:     General: No scleral icterus.       Right eye: No discharge.        Left  eye: No discharge.     Conjunctiva/sclera: Conjunctivae normal.     Pupils: Pupils are equal, round, and reactive to light.  Neck:     Vascular: No carotid bruit.  Cardiovascular:     Rate and Rhythm: Normal rate and regular rhythm.     Pulses: Normal pulses.     Heart sounds: Normal heart sounds.  Pulmonary:     Effort: Pulmonary effort is normal. No respiratory distress.     Breath sounds: Normal breath sounds.  Abdominal:     General: Abdomen is flat. Bowel sounds are normal.     Palpations: Abdomen is soft.  Musculoskeletal:        General: Normal range of motion.     Cervical back: Normal range of motion.  Skin:    General: Skin is warm and dry.  Neurological:     General: No focal deficit present.     Mental Status: She is alert and oriented to person, place, and time. Mental status is at baseline.  Psychiatric:        Mood and Affect: Mood normal.        Behavior: Behavior normal.        Thought Content: Thought content normal.        Judgment: Judgment normal.     Subsequent Medicare wellness visit   Visit info / Clinical Intake: Medicare Wellness Visit Type:: Subsequent Annual Wellness Visit Persons participating in visit:: patient Medicare Wellness Visit Mode:: In-person (required for WTM) Information given by:: patient Interpreter Needed?: No Pre-visit prep was completed: yes AWV questionnaire completed by patient prior to visit?: yes Date:: 01/08/24 Living arrangements:: lives with spouse/significant other Patient's Overall Health Status Rating: excellent Typical amount of pain: none Does pain affect daily life?: no Are you currently prescribed opioids?: no  Dietary Habits and Nutritional Risks How many meals a day?: 2 Eats fruit and vegetables daily?: yes Most meals are obtained by: preparing own meals In the last 2 weeks, have you had any of the following?: none Diabetic:: (!) yes Any non-healing wounds?: no How often do you check your BS?: as  needed Would you like to be referred to a Nutritionist or for Diabetic Management? : no  Functional Status Activities of Daily Living (to include ambulation/medication): Independent Ambulation: Independent Medication Administration: Independent Home Management: Independent Manage your own finances?: yes Primary transportation is: driving Concerns about vision?: no *vision screening is required for WTM* Concerns about hearing?: no  Fall Screening Falls in the past year?: 0 Number of falls in past year: 0 Was there an injury with Fall?: 0  Fall Risk Category Calculator: 0 Patient Fall Risk Level: Low Fall Risk  Fall Risk Patient at Risk for Falls Due to: No Fall Risks Fall risk Follow up: Falls evaluation completed  Home and Transportation Safety: All rugs have non-skid backing?: yes All stairs or steps have railings?: yes Grab bars in the bathtub or shower?: (!) no Have non-skid surface in bathtub or shower?: yes Good home lighting?: yes Regular seat belt use?: yes Hospital stays in the last year:: no  Cognitive Assessment Difficulty concentrating, remembering, or making decisions? : no Will 6CIT or Mini Cog be Completed: yes What year is it?: 0 points What month is it?: 0 points Give patient an address phrase to remember (5 components): the cow jumped over the moon About what time is it?: 0 points Count backwards from 20 to 1: 0 points Say the months of the year in reverse: 0 points Repeat the address phrase from earlier: 0 points 6 CIT Score: 0 points  Advance Directives (For Healthcare) Does Patient Have a Medical Advance Directive?: Yes Does patient want to make changes to medical advance directive?: No - Patient declined Type of Advance Directive: Healthcare Power of [place]; Living will Copy of Healthcare Power of Attorney in Chart?: No - copy requested Copy of Living Will in Chart?: No - copy requested  Reviewed/Updated  Reviewed/Updated: Reviewed All  (Medical, Surgical, Family, Medications, Allergies, Care Teams, Patient Goals)    Vision Screening   Right eye Left eye Both eyes  Without correction 20/25 20/40 20/25   With correction         Depression/mood:  Flowsheet Row Office Visit from 01/02/2023 in [facility] [place]  PHQ-9 Total Score 1        Counseling: Counseling given: Not Answered     Lab orders based on risk factors: Laboratory update will be reviewed     Screening: Patient provided with a written and personalized 5-10 year screening schedule in the AVS. Health Maintenance  Topic Date Due   Zoster (Shingles) Vaccine (2 of 2) 02/27/2022   Flu Shot  10/03/2023   COVID-19 Vaccine (5 - 2025-26 season) 11/03/2023   Medicare Annual Wellness Visit  12/12/2023   Hemoglobin A1C  04/02/2024   Yearly kidney function blood test for diabetes  09/21/2024   Yearly kidney health urinalysis for diabetes  09/21/2024   Eye exam for diabetics  11/14/2024   Complete foot exam   01/18/2025   Breast Cancer Screening  02/23/2025   Colon Cancer Screening  09/28/2026   DEXA scan (bone density measurement)  05/30/2027   DTaP/Tdap/Td vaccine (2 - Td or Tdap) 03/13/2031   Pneumococcal Vaccine for age over 52  Completed   Hepatitis C Screening  Completed   Meningitis B Vaccine  Aged Out     Provider List Update: Patient Care Team    Relationship Specialty Notifications Start End  Theophilus Andrews, Tully GRADE, MD PCP - General Internal Medicine  07/09/21   Claudene Arthea HERO, DO Consulting Physician Family Medicine  04/14/18   Hillman Bare, MD Consulting Physician Pulmonary Disease  04/16/18   Cleatus Collar, MD Consulting Physician Ophthalmology  04/16/18   [facility] Consulting Physician   04/16/18   Cheryle Debby LABOR, MD Consulting Physician Neurosurgery  04/16/18   Mindi Mt, MD (Inactive) Consulting Physician Anesthesiology  04/16/18   Dow Maxwell, PT Physical Therapist  Physical Therapy  09/30/18        I have personally reviewed and noted the following  in the patient's chart:   Medical and social history Use of alcohol, tobacco or illicit drugs  Current medications and supplements Functional ability and status Nutritional status Physical activity Advanced directives List of other physicians Hospitalizations, surgeries, and ER visits in previous 12 months Vitals Screenings to include cognitive, depression, and falls Referrals and appointments  In addition, I have reviewed and discussed with patient certain preventive protocols, quality metrics, and best practice recommendations. A written personalized care plan for preventive services as well as general preventive health recommendations were provided to patient.  Impression and Plan:  Medicare annual wellness visit, subsequent  Type 2 diabetes mellitus with other specified complication, without long-term current use of insulin  (HCC) -     Semaglutide  (2 MG/DOSE); Inject 2 mg as directed once a week.  Dispense: 3 mL; Refill: 2 -     Microalbumin / creatinine urine ratio -     CBC with Differential/Platelet; Future -     Comprehensive metabolic panel with GFR; Future -     Hemoglobin A1c; Future  Hypertension associated with diabetes (HCC)  Hyperlipidemia associated with type 2 diabetes mellitus (HCC) -     Lipid panel; Future  Immunization due  -Recommend routine eye and dental care. -Healthy lifestyle discussed in detail. -Labs to be updated today. -Prostate cancer screening: N/A Health Maintenance  Topic Date Due   Zoster (Shingles) Vaccine (2 of 2) 02/27/2022   Flu Shot  10/03/2023   COVID-19 Vaccine (5 - 2025-26 season) 11/03/2023   Medicare Annual Wellness Visit  12/12/2023   Hemoglobin A1C  04/02/2024   Yearly kidney function blood test for diabetes  09/21/2024   Yearly kidney health urinalysis for diabetes  09/21/2024   Eye exam for diabetics  11/14/2024   Complete foot  exam   01/18/2025   Breast Cancer Screening  02/23/2025   Colon Cancer Screening  09/28/2026   DEXA scan (bone density measurement)  05/30/2027   DTaP/Tdap/Td vaccine (2 - Td or Tdap) 03/13/2031   Pneumococcal Vaccine for age over 21  Completed   Hepatitis C Screening  Completed   Meningitis B Vaccine  Aged Out      - Flu vaccine given in office today. - She will schedule mammogram, second shingles vaccine and GYN appointment. - Increase Ozempic  to 2 mg weekly.    Tully Theophilus Andrews, MD [facility]

## 2024-01-19 NOTE — Addendum Note (Signed)
 Addended by: KATHRYNE MILLMAN B on: 01/19/2024 02:43 PM   Modules accepted: Orders

## 2024-01-20 LAB — MICROALBUMIN / CREATININE URINE RATIO
Creatinine,U: 102.3 mg/dL
Microalb Creat Ratio: 11.6 mg/g (ref 0.0–30.0)
Microalb, Ur: 1.2 mg/dL (ref 0.0–1.9)

## 2024-01-20 NOTE — Progress Notes (Signed)
   01/20/2024  Patient ID: Kristie King, female   DOB: [birth year], 73 y.o.   MRN: [id number]  Pharmacy Quality Measure Review  This patient is appearing on a report for being at risk of failing the adherence measure for cholesterol (statin) medications this calendar year.   Medication: Atorvastatin  Last fill date: 01/09/24 for 90 day supply  Insurance report was not up to date. No action needed at this time.   Jon VEAR Lindau, PharmD Clinical Pharmacist [phone]

## 2024-01-23 ENCOUNTER — Telehealth: Payer: Self-pay

## 2024-01-23 NOTE — Progress Notes (Signed)
   01/23/2024  Patient ID: Kristie King, female   DOB: 1950-08-29, 73 y.o.   MRN: [id number]  Pharmacy Quality Measure Review  This patient is appearing on a report for being at risk of failing the adherence measure for cholesterol (statin) medications this calendar year.   Medication: Atorvastatin  Last fill date: 08/29/23 for 90 day supply  Left voicemail for patient to return my call at their convenience. Per pharmacy, rx was ready on 01/09/24 but returned due to not being picked up.  Jon VEAR Lindau, PharmD Clinical Pharmacist [phone]

## 2024-01-26 ENCOUNTER — Telehealth: Payer: Self-pay

## 2024-01-26 NOTE — Progress Notes (Signed)
   01/26/2024  Patient ID: Kristie King, female   DOB: Mar 11, 1950, 73 y.o.   MRN: [id number]  Faxed in refill order request for Ozempic  2mg  to [facility], pending processing.  Jon VEAR Lindau, PharmD Clinical Pharmacist [phone]

## 2024-01-27 ENCOUNTER — Ambulatory Visit: Payer: Self-pay | Admitting: Pulmonary Disease

## 2024-02-06 ENCOUNTER — Telehealth: Payer: Self-pay

## 2024-02-06 NOTE — Telephone Encounter (Signed)
 Copied from CRM #[id number]. Topic: Clinical - Medication Question >> Feb 06, 2024  2:57 PM Dedra B wrote: Reason for CRM: Pt returning call for [facility] regarding Ozempic  from patient assistance.

## 2024-02-06 NOTE — Telephone Encounter (Signed)
 Left message for the patient to return my call in regards to Ozempic  from patient assistance

## 2024-02-09 NOTE — Telephone Encounter (Signed)
 Left message on machine for patient that her sample is available for pick up.

## 2024-02-12 ENCOUNTER — Other Ambulatory Visit: Payer: Self-pay | Admitting: Internal Medicine

## 2024-02-12 ENCOUNTER — Encounter (HOSPITAL_BASED_OUTPATIENT_CLINIC_OR_DEPARTMENT_OTHER): Payer: Self-pay | Admitting: Pulmonary Disease

## 2024-02-12 ENCOUNTER — Ambulatory Visit (INDEPENDENT_AMBULATORY_CARE_PROVIDER_SITE_OTHER): Admitting: Pulmonary Disease

## 2024-02-12 VITALS — BP 137/83 | HR 64 | Ht 62.5 in | Wt 141.6 lb

## 2024-02-12 DIAGNOSIS — D860 Sarcoidosis of lung: Secondary | ICD-10-CM | POA: Diagnosis not present

## 2024-02-12 DIAGNOSIS — D863 Sarcoidosis of skin: Secondary | ICD-10-CM | POA: Diagnosis not present

## 2024-02-12 DIAGNOSIS — I10 Essential (primary) hypertension: Secondary | ICD-10-CM

## 2024-02-12 DIAGNOSIS — D869 Sarcoidosis, unspecified: Secondary | ICD-10-CM

## 2024-02-12 NOTE — Patient Instructions (Signed)
 F/u in 10 months with pulmonary function test

## 2024-02-12 NOTE — Progress Notes (Signed)
 Subjective:   PATIENT ID: Kristie King GENDER: female DOB: 03/26/1950, MRN: [id number]   HPI  Chief Complaint  Patient presents with   Sarcoidosis    Reason for Visit: Follow-up  Kristie King is 73year old female with sarcoidosis, arthritis, HTN, DM2, HLD who presents for follow-up.  Initial consult She intially presented with facial and arm lesions and diagnosed via skin biopsy in the 1970s. She was treated with high dose steroids and methotrexate in the 80s. She was previously followed by Pulmonary at [facility] and [facility]. Last note in 10/22/12 by [facility] and tapered off steroids. Note comments that she had sarcoid involving lung, skin, sinus and larynx. She was seen by Dr. Lemond Ill in Dermatology in 2016 and had received injections for facial lesions.  She reports cough for the last 8-9 months. She was seen by GI. Scheduled for ENT next month. Seen by PCP and advised to hold lisinopril  for her hypertension. Has started some reflux medication with help in cough. Reports nasal congestion  07/06/21 She recently returned from vacation. Diagnosed with strep throat and started on amoxicillin . She reports cough before this illness remains persistent. When she held her lisinopril  but no appreciable difference. Uses flonase  nightly. However had nose bleeds x 2. Stopped omeprazole  after 10 days   10/08/21 Last month she was treated with Z pack for acute respiratory infection. She has productive cough that has persisted for the last two weeks. No wheezing or shortness of breath. Prior to her illness she felt her cough was doing better and even self discontinued nasal sprays and reflux meds.  05/10/22 Since our last visit she was doing well until the last few weeks when she had increased nasal congestion. Also has had more frequent nosebleeds. This will occur with changes in humidity. Uses humidifier at night. Taking sudaphed as needed and zyrtec. She is working out 4 days a week. Denies fatigue,  shortness of breath, cough, wheezing, chest pain. No limitations in activity  02/06/23 Since our last visit she reports overall doing well. Denies fatigue, shortness of breath, cough, wheezing, chest pain. Works out regularly four days a week, primarily cardio with zumba and line dancing  02/12/24 Since our last visit her general health is well. She is followed by her PCP and has labwork completed with Dr. Theophilus Andrews. Goes to the gym 4 days a week including line dancing and light weight training.  Social History: Retired Scientist, Forensic exposures: Asbestosis  Past Medical History:  Diagnosis Date   AC (acromioclavicular) arthritis 11/13/2016   Allergy    SEASONAL   Cataract    BILATERAL-REMOVED   Cervical radiculopathy at C6 09/10/2016   CKD stage 2 due to type 2 diabetes mellitus (HCC) 04/14/2018   Diabetes mellitus without complication (HCC)    History of lumbar laminectomy 2019   HTN (hypertension) 09/17/2012   Hyperlipidemia    Hyperlipidemia associated with type 2 diabetes mellitus (HCC) 09/17/2012   Hypertension associated with diabetes (HCC) 04/14/2018   Iron deficiency anemia 10/16/2016   Osteoarthritis of spine with radiculopathy, cervical region 08/03/2016   Vitamin D  deficiency 10/16/2016     Family History  Problem Relation Age of Onset   Stroke Mother    Stroke Father    Hyperlipidemia Sister    Diabetes Brother    Hypertension Brother    Diabetes Sister    Hypertension Sister    Stroke Maternal Grandfather    Colon cancer Neg Hx  Stomach cancer Neg Hx    Esophageal cancer Neg Hx    Pancreatic cancer Neg Hx    Colon polyps Neg Hx    Ulcerative colitis Neg Hx      Social History   Occupational History   Occupation: Retired    Associate Professor: [employer]  Tobacco Use   Smoking status: Former    Current packs/day: 0.00    Types: Cigarettes    Start date: 01/14/1961    Quit date: 01/15/1971    Years since quitting: 53.1   Smokeless tobacco: Never   Vaping Use   Vaping status: Never Used  Substance and Sexual Activity   Alcohol use: Yes    Comment: 09/17/2012 drink on special occasions only   Drug use: No   Sexual activity: Yes    Partners: Male    No Known Allergies   Outpatient Medications Prior to Visit  Medication Sig Dispense Refill   amLODipine  (NORVASC ) 5 MG tablet TAKE 1 TABLET (5 MG TOTAL) BY MOUTH DAILY. 90 tablet 0   atorvastatin  (LIPITOR) 40 MG tablet TAKE 1 TABLET BY MOUTH EVERY DAY 90 tablet 0   meloxicam  (MOBIC ) 15 MG tablet Take 1 tablet (15 mg total) by mouth daily. 30 tablet 2   Semaglutide , 2 MG/DOSE, 8 MG/3ML SOPN Inject 2 mg as directed once a week. 3 mL 2   traZODone  (DESYREL ) 50 MG tablet TAKE 1/2 TO 1 TABLET BY MOUTH AT BEDTIME AS NEEDED FOR SLEEP 90 tablet 1   hydrocortisone  2.5 % ointment Apply topically 2 (two) times daily. Apply to affected area twice daily until clear. (Patient not taking: Reported on 02/12/2024) 30 g 0   No facility-administered medications prior to visit.    Review of Systems  Constitutional:  Negative for chills, diaphoresis, fever, malaise/fatigue and weight loss.  HENT:  Negative for congestion.   Respiratory:  Negative for cough, hemoptysis, sputum production, shortness of breath and wheezing.   Cardiovascular:  Negative for chest pain, palpitations and leg swelling.     Objective:   Vitals:   02/12/24 0839  BP: 137/83  Pulse: 64  SpO2: 100%  Weight: 141 lb 9.6 oz (64.2 kg)  Height: 5' 2.5 (1.588 m)   SpO2: 100 %  Physical Exam: General: Well-appearing, no acute distress HENT: [place], AT Eyes: EOMI, no scleral icterus Respiratory: Clear to auscultation bilaterally.  No crackles, wheezing or rales Cardiovascular: RRR, -M/R/G, no JVD Extremities:-Edema,-tenderness Neuro: AAO x4, CNII-XII grossly intact Psych: Normal mood, normal affect  Data Reviewed:  Imaging: CXR 05/06/21 - No infiltrate, effusion or edema CT Chest 09/28/21 - Nearly reoslved GGO  bilaterally in peribronchovascular distribution that is less pronounced. Perilymphatic nodularities with largest in LUL.  CT Chest 01/12/24 - Resolution of peribronchovascular GGO. Unchanged nodularity in upper lobes bilaterally. Unchanged nodule area associated with architectural distortion in LUL .  PFT: 07/06/21 FVC 2.16 (91%) FEV1 1.56 (85%) Ratio 69  TLC 103% DLCO 100% Interpretation: Normal PFTs. No obstructive or restrictive defect. No significant bronchodilator response however does not preclude benefit of inhalers.    Labs:    Latest Ref Rng & Units 10/09/2021    8:38 AM 05/11/2021    9:15 AM 04/26/2021   12:04 PM  CBC  WBC 4.0 - 10.5 K/uL 6.6  7.4  5.5   Hemoglobin 12.0 - 15.0 g/dL 88.5  88.6  88.2   Hematocrit 36.0 - 46.0 % 34.5  34.5  35.8   Platelets 150.0 - 400.0 K/uL 326.0  305.0  272.0  Stable anemia      Latest Ref Rng & Units 09/22/2023   12:00 AM 10/09/2021    8:38 AM 07/20/2021    9:27 AM  BMP  Glucose 70 - 99 mg/dL  96  77   BUN 6 - 23 mg/dL  25  19   Creatinine 0.5 - 1.1 0.8     0.83  0.87   Sodium 135 - 145 mEq/L  140  142   Potassium 3.5 - 5.1 mEq/L  3.5  3.6   Chloride 96 - 112 mEq/L  104  103   CO2 19 - 32 mEq/L  27  30   Calcium  8.4 - 10.5 mg/dL  9.4  9.5      This result is from an external source.        Latest Ref Rng & Units 10/09/2021    8:38 AM 04/26/2021   12:04 PM 11/10/2020   11:47 AM  Hepatic Function  Total Protein 6.0 - 8.3 g/dL 7.0  7.1  6.8   Albumin 3.5 - 5.2 g/dL 4.1  4.4  3.9   AST 0 - 37 U/L 20  16  20    ALT 0 - 35 U/L 6  5  8    Alk Phosphatase 39 - 117 U/L 47  52  49   Total Bilirubin 0.2 - 1.2 mg/dL 0.6  0.7  0.6     Absolute eos 04/26/21 - 100     Assessment & Plan:   Discussion: 73 year old female with sarcoidosis, arthritis, HTN, DM2, HLD who presents for routine follow-up. Asymptomatic. Reviewed CT scan with improved parenchymal findings otherwise stable. No indication for immunosuppression.  We discussed the  clinical course of sarcoid and management including serial PFTs, labs, eye exam, and EKG and chest imaging if indicated. If symptoms suggest sarcoid flare in the future, we would manage with steroids +/- biologics.  Pulmonary and skin sarcoidosis --Dx in 1970s via skin bx --Annual ophthalmology exam. [facility] 2024 - No ocular sarcoid involvement. Due 2025 --Routine Dermatology. Last visit 08/2022.  --Labs q6 months. Will defer since patient will obtain at next PCP visit --Continue exercises 5-7 days a week  Sarcoid Monitoring --Recent chest imaging reviewed 01/2024 - resolved GGO. Unchanged nodularity and scarring --Annual PFTs.  Plan for Nov/Dec 2025 --Annual ophthalmology exam. UTD Sept 2025 - neg for sarcoid   Health Maintenance Immunization History  Administered Date(s) Administered   Fluad Quad(high Dose 65+) 11/11/2018, 11/10/2020, 01/09/2022   Fluad Trivalent(High Dose 65+) 01/02/2023   INFLUENZA, HIGH DOSE SEASONAL PF 12/12/2017, 01/19/2024   Influenza Inj Mdck Quad Pf 02/07/2017   Influenza,inj,Quad PF,6+ Mos 03/09/2015   Influenza-Unspecified 02/07/2017   PFIZER(Purple Top)SARS-COV-2 Vaccination 04/08/2019, 04/29/2019, 11/30/2019, 01/09/2021   PNEUMOCOCCAL CONJUGATE-20 07/09/2021   Pneumococcal Polysaccharide-23 05/18/2020   Tdap 03/12/2021   Zoster Recombinant(Shingrix) 01/02/2022, 01/19/2024   CT Lung Screen - not qualified. Quit smoking >50 lbs  Orders Placed This Encounter  Procedures   Pulmonary function test    Standing Status:   Future    Expiration Date:   02/11/2025    Where should this test be performed?:   Outpatient Pulmonary    What type of PFT is being ordered?:   Full PFT   No orders of the defined types were placed in this encounter.   Return in about 10 months (around 12/12/2024) for after PFT.   I have spent a total time of 31-minutes on the day of the appointment including chart review,  data review, collecting history, coordinating care and  discussing medical diagnosis and plan with the patient/family. Past medical history, allergies, medications were reviewed. Pertinent imaging, labs and tests included in this note have been reviewed and interpreted independently by me.  Kristie King Staff, MD [facility] 02/12/2024 9:48 AM

## 2024-02-24 ENCOUNTER — Ambulatory Visit: Admitting: Orthopedic Surgery

## 2024-03-01 ENCOUNTER — Ambulatory Visit: Payer: Self-pay

## 2024-03-01 NOTE — Telephone Encounter (Signed)
 FYI Only or Action Required?: FYI only for provider: appointment scheduled on 12/30.  Patient was last seen in primary care on 01/19/2024 by Kristie King, Tully GRADE, MD.  Called Nurse Triage reporting Arm Injury.  Symptoms began a week ago.  Interventions attempted: OTC medications: otc pain meds, alcohol rub.  Symptoms are: stable.  Triage Disposition: See Physician Within 24 Hours  Patient/caregiver understands and will follow disposition?: Yes  Copied from CRM #[id number]. Topic: Clinical - Red Word Triage >> Mar 01, 2024 10:53 AM Charlet HERO wrote: Red Word that prompted transfer to Nurse Triage: Patient had a fall a week ago she is stating that she put ointment and green alchol  but it is still hurting really bad. Its her arm and lower back Dr Kristie Reason for Disposition  [1] MODERATE pain (e.g., interferes with normal activities) AND [2] high-risk adult (e.g., age > 60 years, osteoporosis, chronic steroid use)  Answer Assessment - Initial Assessment Questions 1. MECHANISM: How did the injury happen?     Tripped and fell catching herself with her hands.  2. ONSET: When did the injury happen? (e.g., minutes, hours ago)      A week ago 3. LOCATION: Where is the injury located? Which arm?     Having pain in upper arms and thigh 4. APPEARANCE of INJURY: What does the injury look like?      Denies bruising/ swelling 5. SEVERITY: Can you use the arm normally?      yes  7. PAIN: Is there pain? If Yes, ask: How bad is the pain? (Scale 0-10; or none, mild, moderate, severe)     Mild to moderate 8. TETANUS: For any breaks in the skin, ask: When was your last tetanus booster?      9. OTHER SYMPTOMS: Do you have any other symptoms?  (e.g., numbness in hand)     Denies numbness tingling in arms  Protocols used: Arm Injury-A-AH

## 2024-03-02 ENCOUNTER — Ambulatory Visit (INDEPENDENT_AMBULATORY_CARE_PROVIDER_SITE_OTHER)

## 2024-03-02 ENCOUNTER — Encounter: Payer: Self-pay | Admitting: Family Medicine

## 2024-03-02 ENCOUNTER — Ambulatory Visit: Payer: Self-pay | Admitting: Family Medicine

## 2024-03-02 ENCOUNTER — Encounter: Payer: Self-pay | Admitting: Orthopedic Surgery

## 2024-03-02 ENCOUNTER — Ambulatory Visit: Admitting: Family Medicine

## 2024-03-02 VITALS — BP 120/68 | HR 87 | Temp 98.4°F | Ht 62.5 in | Wt 141.0 lb

## 2024-03-02 DIAGNOSIS — S4991XA Unspecified injury of right shoulder and upper arm, initial encounter: Secondary | ICD-10-CM | POA: Diagnosis not present

## 2024-03-02 DIAGNOSIS — M79601 Pain in right arm: Secondary | ICD-10-CM | POA: Diagnosis not present

## 2024-03-02 DIAGNOSIS — W19XXXA Unspecified fall, initial encounter: Secondary | ICD-10-CM

## 2024-03-02 MED ORDER — METHOCARBAMOL 500 MG PO TABS: 500.0000 mg | ORAL_TABLET | Freq: Three times a day (TID) | ORAL | 0 refills | Status: DC

## 2024-03-02 NOTE — Progress Notes (Signed)
 "  Referring Physician:  Ozell Heron HERO, MD [address] [place],  [place] [address]  Primary Physician:  Theophilus Andrews, Tully GRADE, MD  History of Present Illness: 03/09/2024 [name] has a history of HTN, DM, sarcoidosis, vitamin D  deficiency, anemia of chronic disease, hypercholesterolemia, chronic cough.   History of lumbar decompression in 2020. She did improve after her surgery. She's had recurrent back pain since her surgery.   She has constant LBP with no leg pain x 1 year. No numbness, tingling, or weakness. She has relief with working out, but certain exercises bother her. She has some relief with OTC blue Emu and tiger balm.   She had fall around christmas and is having right shoulder pain. She's had some improvement with robaxin .   Tobacco use: Does not smoke.   Bowel/Bladder Dysfunction: none  Conservative measures:  Physical therapy: Last PT was on 10/31/2022 with [facility] no visits since then.  Multimodal medical therapy including regular antiinflammatories:  Meloxicam , Tylenol , Ibuprofen , Prednisone  Injections:   10/21/2017- ESI Left L4-5 06/09/2017- ESI Left L4-5 02/20/2017-ESI Left L4-5  Past Surgery:  03/30/2018-L3 Laminectomy  Kristie King has no symptoms of cervical myelopathy.  The symptoms are causing a significant impact on the patient's life.   Review of Systems:  A 10 point review of systems is negative, except for the pertinent positives and negatives detailed in the HPI.  Past Medical History: Past Medical History:  Diagnosis Date   AC (acromioclavicular) arthritis 11/13/2016   Allergy    SEASONAL   Cataract    BILATERAL-REMOVED   Cervical radiculopathy at C6 09/10/2016   CKD stage 2 due to type 2 diabetes mellitus (HCC) 04/14/2018   Diabetes mellitus without complication (HCC)    History of lumbar laminectomy 2019   HTN (hypertension) 09/17/2012   Hyperlipidemia    Hyperlipidemia associated with type 2 diabetes mellitus (HCC) 09/17/2012    Hypertension associated with diabetes (HCC) 04/14/2018   Iron deficiency anemia 10/16/2016   Osteoarthritis of spine with radiculopathy, cervical region 08/03/2016   Vitamin D  deficiency 10/16/2016    Past Surgical History: Past Surgical History:  Procedure Laterality Date   BACK SURGERY  03/30/2018   L3 laminectomy   BUNIONECTOMY Right 02/18/2013   BUNIONECTOMY Left 04/01/2013   CATARACTS      Allergies: Allergies as of 03/09/2024   (No Known Allergies)    Medications: Outpatient Encounter Medications as of 03/09/2024  Medication Sig   amLODipine  (NORVASC ) 5 MG tablet TAKE 1 TABLET (5 MG TOTAL) BY MOUTH DAILY.   atorvastatin  (LIPITOR) 40 MG tablet TAKE 1 TABLET BY MOUTH EVERY DAY   Semaglutide , 2 MG/DOSE, 8 MG/3ML SOPN Inject 2 mg as directed once a week.   [DISCONTINUED] meloxicam  (MOBIC ) 15 MG tablet Take 1 tablet (15 mg total) by mouth daily.   [DISCONTINUED] methocarbamol  (ROBAXIN ) 500 MG tablet Take 1 tablet (500 mg total) by mouth every 8 (eight) hours.   [DISCONTINUED] traZODone  (DESYREL ) 50 MG tablet TAKE 1/2 TO 1 TABLET BY MOUTH AT BEDTIME AS NEEDED FOR SLEEP   [DISCONTINUED] hydrocortisone  2.5 % ointment Apply topically 2 (two) times daily. Apply to affected area twice daily until clear. (Patient not taking: Reported on 03/02/2024)   No facility-administered encounter medications on file as of 03/09/2024.    Social History: Social History[1]  Family Medical History: Family History  Problem Relation Age of Onset   Stroke Mother    Stroke Father    Hyperlipidemia Sister    Diabetes  Brother    Hypertension Brother    Diabetes Sister    Hypertension Sister    Stroke Maternal Grandfather    Colon cancer Neg Hx    Stomach cancer Neg Hx    Esophageal cancer Neg Hx    Pancreatic cancer Neg Hx    Colon polyps Neg Hx    Ulcerative colitis Neg Hx     Physical Examination: Vitals:   03/09/24 0858  BP: 122/76    General: Patient is well developed, well  nourished, calm, collected, and in no apparent distress. Attention to examination is appropriate.  Respiratory: Patient is breathing without any difficulty.   NEUROLOGICAL:     Awake, alert, oriented to person, place, and time.  Speech is clear and fluent. Fund of knowledge is appropriate.   Cranial Nerves: Pupils equal round and reactive to light.  Facial tone is symmetric.    No posterior cervical tenderness.   No posterior lumbar tenderness. Well healed lumbar incision.   No abnormal lesions on exposed skin.   Strength: Side Biceps Triceps Deltoid Interossei Grip Wrist Ext. Wrist Flex.  R 5 5 5 5 5 5 5   L 5 5 5 5 5 5 5    Side Iliopsoas Quads Hamstring PF DF EHL  R 5 5 5 5 5 5   L 5 5 5 5 5 5    Reflexes are 2+ and symmetric at the biceps, brachioradialis, patella and achilles.   Hoffman's is absent.  Clonus is not present.   Bilateral upper and lower extremity sensation is intact to light touch.     Limited ROM of right shoulder with pain. Mild diffuse shoulder tenderness. No pain with ROM of left shoulder.   No pain with IR/ER of both hips.   Gait is normal.     Medical Decision Making  Imaging: Lumbar xrays dated 01/08/24:  FINDINGS: Mild dextrocurvature of the lumbar spine, similar to prior exams. Five non-rib-bearing lumbar-type vertebral bodies. Unchanged grade 1 anterolisthesis of L3 on L4. Stable to slightly progressed moderate disc height loss at L3-L4 and L4-L5, with less significant height loss noted at L5-S1. Moderate to severe multilevel facet arthropathy in the lower lumbar spine, greatest at L3-L4 and L5-S1. This is also progressed from 2023. Vascular atherosclerotic calcifications are seen. Lung bases are clear. Bowel-gas pattern is nonobstructive. SI joints are symmetric. The sacrum is poorly assessed but grossly unremarkable. Degenerated fibroid is again noted in the pelvis.   IMPRESSION: Unchanged mild dextrocurvature and grade 1 anterolisthesis  of L3 on L4, likely due to facet arthropathy.   Slight interval progression of lower lumbar degenerative disc disease and facet arthropathy compared to 2023, as above. No acute findings.     Electronically Signed   By: Norleen Croak M.D.   On: 01/12/2024 11:06    I have personally reviewed the images and agree with the above interpretation.  Assessment and Plan: Ms. Baksh has a history of lumbar decompression in 2020. She did improve after her surgery., but she's had recurrent back pain since her surgery.   She has constant LBP with no leg pain x 1 year. No numbness, tingling, or weakness.  She has known slip L3-L4 with lumbar spondylosis and DDD L3-S1.   She had fall around christmas and is having right shoulder pain.   Treatment options discussed with patient and following plan made:   - MRI of lumbar spine to further evaluate LBP s/p surgery.  - Lumbar xrays with flex/ext to be done on her  way out.  - Discussed PT and she declines for now.  - Referral to [facility]o [facility]) for right shoulder pain.  - Depending on imaging, may consider injections.  - Will schedule phone visit to review MRI and xray results once I get them back.   I spent a total of 35 minutes in face-to-face and non-face-to-face activities related to this patient's care today including review of outside records, review of imaging, review of symptoms, physical exam, discussion of differential diagnosis, discussion of treatment options, and documentation.   Thank you for involving me in the care of this patient.   Glade Boys PA-C Dept. of Neurosurgery      [1]  Social History Tobacco Use   Smoking status: Former    Current packs/day: 0.00    Types: Cigarettes    Start date: 01/14/1961    Quit date: 01/15/1971    Years since quitting: 53.1   Smokeless tobacco: Never  Vaping Use   Vaping status: Never Used  Substance Use Topics   Alcohol use: Yes    Comment: 09/17/2012 drink on special occasions only    Drug use: No   "

## 2024-03-02 NOTE — Patient Instructions (Signed)
-  It was a pleasure to care for you today.  -Ordered right shoulder x-ray and upper arm x-ray. Office will call with results and will be available via MyChart. -Alternate Tylenol  1000mg  and Ibuprofen  600-800mg  every 4 hours for pain, headache, and body aches. Recommend to eat something when taking Ibuprofen .   -May apply cool or warm compresses to the area, 4-6 times a day, up to 20 minutes at time.  -Follow up if not improved.

## 2024-03-02 NOTE — Progress Notes (Signed)
" ° °  Established Patient Office Visit   Subjective:  Patient ID: Kristie King, female    DOB: 1950-04-06  Age: 73 y.o. MRN: [id number]  Chief Complaint  Patient presents with   Shoulder Pain    HPI Patient reports she was walking on an uneven surface to get to a friends front door and had a fall on last Monday. She reports she landed on her right shoulder while trying to protect a gift that was in her arms. Denies hitting her head.   She reports she has been applying OTC creams and green alcohol on the area with no relief. Also, used a muscle gun with no relief.   ROS See HPI above     Objective:   BP 120/68   Pulse 87   Temp 98.4 F (36.9 C) (Oral)   Ht 5' 2.5 (1.588 m)   Wt 141 lb (64 kg)   SpO2 90%   BMI 25.38 kg/m    Physical Exam Vitals reviewed.  Constitutional:      General: She is not in acute distress.    Appearance: Normal appearance. She is not ill-appearing, toxic-appearing or diaphoretic.  Eyes:     General:        Right eye: No discharge.        Left eye: No discharge.     Conjunctiva/sclera: Conjunctivae normal.  Cardiovascular:     Rate and Rhythm: Normal rate.  Pulmonary:     Effort: Pulmonary effort is normal. No respiratory distress.  Musculoskeletal:        General: Normal range of motion.     Right shoulder: Swelling and tenderness present. No deformity. Normal range of motion. Normal pulse.     Right upper arm: Tenderness present. No swelling, edema or deformity.  Skin:    General: Skin is warm and dry.  Neurological:     General: No focal deficit present.     Mental Status: She is alert and oriented to person, place, and time. Mental status is at baseline.  Psychiatric:        Mood and Affect: Mood normal.        Behavior: Behavior normal.        Thought Content: Thought content normal.        Judgment: Judgment normal.      Assessment & Plan:  Injury of right shoulder, initial encounter -     DG Shoulder Right; Future  Fall,  initial encounter -     DG Shoulder Right; Future -     DG Humerus Right; Future  Right arm pain -     DG Humerus Right; Future  -Ordered right shoulder x-ray and upper arm x-ray. Office will call with results and will be available via MyChart. -Alternate Tylenol  1000mg  and Ibuprofen  600-800mg  every 4 hours for pain, headache, and body aches. Recommend to eat something when taking Ibuprofen .   -May apply cool or warm compresses to the area, 4-6 times a day, up to 20 minutes at time.  -Follow up if not improved.   Armandina Iman, NP "

## 2024-03-09 ENCOUNTER — Ambulatory Visit: Admitting: Orthopedic Surgery

## 2024-03-09 ENCOUNTER — Encounter: Payer: Self-pay | Admitting: Orthopedic Surgery

## 2024-03-09 ENCOUNTER — Ambulatory Visit (INDEPENDENT_AMBULATORY_CARE_PROVIDER_SITE_OTHER)

## 2024-03-09 VITALS — BP 122/76 | Wt 137.4 lb

## 2024-03-09 DIAGNOSIS — M47816 Spondylosis without myelopathy or radiculopathy, lumbar region: Secondary | ICD-10-CM

## 2024-03-09 DIAGNOSIS — M25511 Pain in right shoulder: Secondary | ICD-10-CM | POA: Diagnosis not present

## 2024-03-09 DIAGNOSIS — M5136 Other intervertebral disc degeneration, lumbar region with discogenic back pain only: Secondary | ICD-10-CM

## 2024-03-09 DIAGNOSIS — W19XXXA Unspecified fall, initial encounter: Secondary | ICD-10-CM | POA: Diagnosis not present

## 2024-03-09 DIAGNOSIS — M545 Low back pain, unspecified: Secondary | ICD-10-CM | POA: Diagnosis not present

## 2024-03-09 DIAGNOSIS — M4316 Spondylolisthesis, lumbar region: Secondary | ICD-10-CM

## 2024-03-09 LAB — HM MAMMOGRAPHY

## 2024-03-09 NOTE — Patient Instructions (Signed)
 It was so nice to see you today. Thank you so much for coming in.    You have some wear and tear in your back with a slip at L3-L4.   I want to get an MRI of your back to look into things further. We will get this approved through your insurance and [facility] will call you to schedule the appointment. Ask about your patient responsibility. You do not need to pay this prior to getting MRI, they can bill you.   After you have the MRI, it can take 14-28 days for me to get the results back. If I don't have them in 2 weeks, we will call to try to get the results.   We did xrays today of your back. Will review results when we review your MRI.   Once I have the results, we will call you to schedule a follow up phone visit visit with me to review them.   I want you to see [facility] in [facility] for your right shoulder. Dr. Genelle is great and will take good care of you. They should call you to schedule an appointment or you can call them at [phone].   Please do not hesitate to call if you have any questions or concerns. You can also message me in MyChart.   Glade Boys PA-C [phone]     The physicians and staff at [facility] at [facility] are committed to providing excellent care. You may receive a survey asking for feedback about your experience at our office. We value you your feedback and appreciate you taking the time to to fill it out. The [facility] leadership team is also available to discuss your experience in person, feel free to contact us  [phone].

## 2024-03-15 ENCOUNTER — Other Ambulatory Visit

## 2024-03-15 DIAGNOSIS — E1169 Type 2 diabetes mellitus with other specified complication: Secondary | ICD-10-CM

## 2024-03-15 DIAGNOSIS — Z7985 Long-term (current) use of injectable non-insulin antidiabetic drugs: Secondary | ICD-10-CM

## 2024-03-15 NOTE — Progress Notes (Signed)
" ° °  03/15/2024  Patient ID: Kristie King, female   DOB: September 07, 1950, 74 y.o.   MRN: [id number]  Contacted patient via telephone to follow up on Ozempic  access for 2026.   Patient is aware that Ozempic  PAP ended as of 03/04/24, which is how she was getting the medication through [facility].  Confirmed with patient that she has HTA - Diabetes Care plan for 2026. Price check shows Ozempic  would be $0 copay for patient at pharmacy.  Notified patient and she can get rx at [facility], they have refills on file. Patient aware to reach out with any concerns.  Jon VEAR Lindau, PharmD Clinical Pharmacist [phone]  "

## 2024-03-18 ENCOUNTER — Other Ambulatory Visit: Payer: Self-pay | Admitting: Family Medicine

## 2024-03-18 DIAGNOSIS — M79601 Pain in right arm: Secondary | ICD-10-CM

## 2024-03-18 MED ORDER — PREDNISONE 10 MG PO TABS: ORAL_TABLET | ORAL | 0 refills | Status: AC

## 2024-03-23 ENCOUNTER — Inpatient Hospital Stay: Admission: RE | Admit: 2024-03-23 | Source: Ambulatory Visit

## 2024-03-31 ENCOUNTER — Ambulatory Visit (HOSPITAL_BASED_OUTPATIENT_CLINIC_OR_DEPARTMENT_OTHER): Admitting: Orthopaedic Surgery

## 2024-03-31 DIAGNOSIS — M25511 Pain in right shoulder: Secondary | ICD-10-CM

## 2024-03-31 NOTE — Progress Notes (Signed)
 "   Chief Complaint: Right shoulder pain     History of Present Illness:    Kristie King is a 74 y.o. female right dominant female presents with right shoulder pain after an injury 2 weeks prior to Christmas.  She was dropping a gift off at her friend's house and landed directly on the shoulder.  Since this time she has had extremely difficult time with overhead range of motion.  This has been making ADLs or working on her hair very difficult.  She did not have any pain in the shoulder prior to this injury.    PMH/PSH/Family History/Social History/Meds/Allergies:    Past Medical History:  Diagnosis Date   AC (acromioclavicular) arthritis 11/13/2016   Allergy    SEASONAL   Cataract    BILATERAL-REMOVED   Cervical radiculopathy at C6 09/10/2016   CKD stage 2 due to type 2 diabetes mellitus (HCC) 04/14/2018   Diabetes mellitus without complication (HCC)    History of lumbar laminectomy 2019   HTN (hypertension) 09/17/2012   Hyperlipidemia    Hyperlipidemia associated with type 2 diabetes mellitus (HCC) 09/17/2012   Hypertension associated with diabetes (HCC) 04/14/2018   Iron deficiency anemia 10/16/2016   Osteoarthritis of spine with radiculopathy, cervical region 08/03/2016   Vitamin D  deficiency 10/16/2016   Past Surgical History:  Procedure Laterality Date   BACK SURGERY  03/30/2018   L3 laminectomy   BUNIONECTOMY Right 02/18/2013   BUNIONECTOMY Left 04/01/2013   CATARACTS     Social History   Socioeconomic History   Marital status: Married    Spouse name: Not on file   Number of children: 0   Years of education: Not on file   Highest education level: Master's degree (e.g., MA, MS, MEng, MEd, MSW, MBA)  Occupational History   Occupation: Retired    Associate Professor: [employer]  Tobacco Use   Smoking status: Former    Current packs/day: 0.00    Types: Cigarettes    Start date: 01/14/1961    Quit date: 01/15/1971    Years since quitting: 53.2   Smokeless tobacco: Never  Vaping Use    Vaping status: Never Used  Substance and Sexual Activity   Alcohol use: Yes    Comment: 09/17/2012 drink on special occasions only   Drug use: No   Sexual activity: Yes    Partners: Male  Other Topics Concern   Not on file  Social History Narrative   Increased stress with impending death of brother whom lives in [place]     Social Drivers of Health   Tobacco Use: Medium Risk (03/09/2024)   Patient History    Smoking Tobacco Use: Former    Smokeless Tobacco Use: Never    Passive Exposure: Not on Actuary Strain: Low Risk (01/08/2024)   Overall Financial Resource Strain (CARDIA)    Difficulty of Paying Living Expenses: Not hard at all  Food Insecurity: No Food Insecurity (01/19/2024)   Epic    Worried About Programme Researcher, Broadcasting/film/video in the Last Year: Never true    Ran Out of Food in the Last Year: Never true  Transportation Needs: No Transportation Needs (01/19/2024)   Epic    Lack of Transportation (Medical): No    Lack of Transportation (Non-Medical): No  Physical Activity: Sufficiently Active (01/08/2024)   Exercise Vital Sign    Days of Exercise per Week: 4 days    Minutes of Exercise per Session: 60 min  Stress: No Stress Concern Present (01/08/2024)  [employer] of Occupational Health - Occupational Stress Questionnaire    Feeling of Stress: Not at all  Social Connections: Socially Integrated (01/08/2024)   Social Connection and Isolation Panel    Frequency of Communication with Friends and Family: Three times a week    Frequency of Social Gatherings with Friends and Family: More than three times a week    Attends Religious Services: More than 4 times per year    Active Member of Clubs or Organizations: Yes    Attends Banker Meetings: 1 to 4 times per year    Marital Status: Married  Depression (PHQ2-9): Low Risk (03/02/2024)   Depression (PHQ2-9)    PHQ-2 Score: 1  Alcohol Screen: Low Risk (01/08/2024)   Alcohol Screen    Last Alcohol  Screening Score (AUDIT): 2  Housing: Low Risk (01/19/2024)   Epic    Unable to Pay for Housing in the Last Year: No    Number of Times Moved in the Last Year: 0    Homeless in the Last Year: No  Utilities: Not At Risk (01/19/2024)   Epic    Threatened with loss of utilities: No  Health Literacy: Adequate Health Literacy (01/19/2024)   B1300 Health Literacy    Frequency of need for help with medical instructions: Never   Family History  Problem Relation Age of Onset   Stroke Mother    Stroke Father    Hyperlipidemia Sister    Diabetes Brother    Hypertension Brother    Diabetes Sister    Hypertension Sister    Stroke Maternal Grandfather    Colon cancer Neg Hx    Stomach cancer Neg Hx    Esophageal cancer Neg Hx    Pancreatic cancer Neg Hx    Colon polyps Neg Hx    Ulcerative colitis Neg Hx    Allergies[1] Current Outpatient Medications  Medication Sig Dispense Refill   amLODipine  (NORVASC ) 5 MG tablet TAKE 1 TABLET (5 MG TOTAL) BY MOUTH DAILY. 90 tablet 0   atorvastatin  (LIPITOR) 40 MG tablet TAKE 1 TABLET BY MOUTH EVERY DAY 90 tablet 0   methocarbamol  (ROBAXIN ) 500 MG tablet Take 500 mg by mouth 4 (four) times daily.     Semaglutide , 2 MG/DOSE, 8 MG/3ML SOPN Inject 2 mg as directed once a week. 3 mL 2   No current facility-administered medications for this visit.   No results found.  Review of Systems:   A ROS was performed including pertinent positives and negatives as documented in the HPI.  Physical Exam :   Constitutional: NAD and appears stated age Neurological: Alert and oriented Psych: Appropriate affect and cooperative There were no vitals taken for this visit.   Comprehensive Musculoskeletal Exam:    Right shoulder with active forward elevation to 100 degrees with pain external rotation at side is 40 degrees with pain internal rotation is to back pocket 4-5 strength with forward elevation positive Neer impingement   Imaging:   Xray (3 views right  shoulder): Normal    I personally reviewed and interpreted the radiographs.   Assessment and Plan:   74 y.o. female with evidence of an acute rotator cuff tear after a fall directly on the side.  Given the fact that this was an acute injury with difficult overhead range of motion and weakness in forward elevation I recommended an MRI of the right shoulder.  Will plan to proceed with this and I will see her back to discuss results  I personally saw and evaluated the patient, and participated in the management and treatment plan.  Elspeth Parker, MD Attending Physician, Orthopedic Surgery  This document was dictated using Dragon voice recognition software. A reasonable attempt at proof reading has been made to minimize errors.    [1] No Known Allergies  "

## 2024-04-01 ENCOUNTER — Ambulatory Visit
Admission: RE | Admit: 2024-04-01 | Discharge: 2024-04-01 | Disposition: A | Discharge status: Home / Self Care | Source: Ambulatory Visit | Attending: Orthopedic Surgery | Admitting: Orthopedic Surgery

## 2024-04-01 DIAGNOSIS — M4316 Spondylolisthesis, lumbar region: Secondary | ICD-10-CM

## 2024-04-01 DIAGNOSIS — M5136 Other intervertebral disc degeneration, lumbar region with discogenic back pain only: Secondary | ICD-10-CM

## 2024-04-01 DIAGNOSIS — M47816 Spondylosis without myelopathy or radiculopathy, lumbar region: Secondary | ICD-10-CM

## 2024-04-05 ENCOUNTER — Other Ambulatory Visit

## 2024-04-08 ENCOUNTER — Inpatient Hospital Stay: Admission: RE | Admit: 2024-04-08 | Discharge: 2024-04-08 | Attending: Orthopaedic Surgery

## 2024-04-08 DIAGNOSIS — M25511 Pain in right shoulder: Secondary | ICD-10-CM

## 2024-04-08 NOTE — Progress Notes (Unsigned)
 "  Telephone Visit- Progress Note: Referring Physician:  Theophilus Andrews, Tully GRADE, MD [address] [place],  [place] [address]  Primary Physician:  Theophilus Andrews, Tully GRADE, MD  This visit was performed via telephone.  Patient location: home Provider location: office  I spent a total of 10 minutes non-face-to-face activities for this visit on the date of this encounter including review of current clinical condition and response to treatment.    Patient has given verbal consent to this telephone visits and we reviewed the limitations of a telephone visit. Patient wishes to proceed.    Chief Complaint:  review her imaging.   History of Present Illness: Kristie King is a 74 y.o. female has a history of  HTN, DM, sarcoidosis, vitamin D  deficiency, anemia of chronic disease, hypercholesterolemia, chronic cough.    History of lumbar decompression in 2020. She did improve after her surgery. She's had recurrent back pain since her surgery.   Last seen by me on 03/09/24 for constant LBP with no leg pain. She has known slip L3-L4 with lumbar spondylosis and DDD L3-S1.   She was sent to ortho for right shoulder pain- MRI was ordered by Dr. Genelle.   She declined PT for her back. Phone visit scheduled to review her lumbar imaging.   She continues with constant LBP with no leg pain. No numbness, tingling, or weakness. She has relief with working out. She has some relief with OTC blue Emu and tiger balm.    Tobacco use: Does not smoke.    Bowel/Bladder Dysfunction: none   Conservative measures:  Physical therapy: Last PT was on 10/31/2022 with [facility] no visits since then.  Multimodal medical therapy including regular antiinflammatories:  Meloxicam , Tylenol , Ibuprofen , Prednisone  Injections:   10/21/2017- ESI Left L4-5 06/09/2017- ESI Left L4-5 02/20/2017-ESI Left L4-5   Past Surgery:  03/30/2018-L3 Laminectomy   Kristie King has no symptoms of cervical myelopathy.   The symptoms  are causing a significant impact on the patient's life.    Exam: No exam done as this was a telephone encounter.     Imaging: Lumbar MRI dated 04/01/24:  FINDINGS:   BONES AND ALIGNMENT: 5 lumbar type vertebrae. Chronic straightening of the normal lumbar lordosis and grade 1 anterolisthesis of L3 on L4. No fracture or suspicious marrow lesion. Mild Modic type 1 endplate changes from L3 to S1.   SPINAL CORD: The conus medullaris terminates at L1-L2 and is normal in signal.   SOFT TISSUES: No paraspinal mass.   DISC LEVELS: Disc desiccation from L2-L3 through L5-S1. Severe disc space narrowing at L3-L4 and L4-L5.   T11-T12: Mild facet arthrosis without disc herniation or stenosis.   T12-L1: Negative.   L1-L2: Minimal disc bulging and mild facet hypertrophy without stenosis, unchanged.   L2-L3: Circumferential disc bulging and moderate to severe facet and ligamentum flavum hypertrophy result in mild spinal stenosis and mild left neural foraminal stenosis, mildly progressed from prior.   L3-L4: Previous laminectomy. Anterolisthesis with diffuse bulging of uncovered disc and severe facet and residual ligamentum flavum hypertrophy result in severe spinal stenosis and moderate right and severe left neural foraminal stenosis, unchanged.   L4-L5: Circumferential disc bulging, a new left central to left subarticular disc protrusion, and moderate facet and ligamentum flavum hypertrophy result in increased, moderate spinal stenosis, moderate right and severe left lateral recess stenosis, and moderate to severe right and mild to moderate left neural foraminal stenosis. Potential right L4 and bilateral L5 nerve root impingement.  L5-S1: Disc bulging and moderate facet and ligamentum flavum hypertrophy without significant stenosis, unchanged.   IMPRESSION: 1. Unchanged severe spinal stenosis and moderate right and severe left neural foraminal stenosis at L3-L4. 2.  Increased, moderate spinal stenosis and moderate to severe lateral recess and neural foraminal stenosis at L4-L5. 3. Increased, mild spinal stenosis at L2-L3.   Electronically signed by: Dasie Hamburg MD 04/05/2024 11:23 AM EST RP Workstation: [id number]   Lumbar xrays dated 03/09/24:  FINDINGS:   LUMBAR SPINE: BONES: 5 lumbar-type vertebral bodies are well visualized. Vertebral body heights are maintained. Alignment is normal. Mild retrolisthesis of L4 on L5 is noted. Flexion and extension views show no significant instability. No pars defects are seen.   DISCS AND DEGENERATIVE CHANGES: Osteophytic changes are noted from L3 to L5. Disc space narrowing is noted at L3-L4 and L4-L5. Mild facet hypertrophic changes are noted.   SOFT TISSUES: A calcified uterine fibroid is seen. No acute abnormality.   IMPRESSION: 1. Disc space narrowing at L3-4 and L4-5 with associated osteophytic changes. 2. Mild retrolisthesis of L4 on L5. 3. Mild facet hypertrophic changes.   Electronically signed by: Oneil Devonshire MD 03/17/2024 12:23 AM EST RP Workstation: [id number]    I have personally reviewed the images and agree with the above interpretation.  Assessment and Plan: Ms. Hutt has a history of lumbar decompression in 2020. She did improve after her surgery, but she's had recurrent back pain since her surgery.    She has constant LBP with no leg pain. No numbness, tingling, or weakness.   She has known mild central stenosis L2-L3, slip L3-L4 with severe central stenosis and moderate right/severe left foraminal stenosis. She has moderate central stenosis L4-L5 with moderate/severe right and mild/moderate left foraminal stenosis. No instability seen on her lumbar xrays.    Treatment options discussed with patient and following plan made:   - Discussed PT and she declines for now.  - Referral to pain management (Lateef) for possible injections.  - Continue to follow up with Dr. Genelle for her  right shoulder.  - Referral to ortho [facility]) for right shoulder pain.  - Will schedule phone visit for follow up in 8 weeks.   Of note, lumbar xrays showed calcified fibroid. She will f/u with PCP about this to see if anything further needs done.   Glade Boys PA-C Neurosurgery "

## 2024-04-09 ENCOUNTER — Ambulatory Visit: Admitting: Orthopedic Surgery

## 2024-04-09 ENCOUNTER — Encounter: Payer: Self-pay | Admitting: Orthopedic Surgery

## 2024-04-09 DIAGNOSIS — M47816 Spondylosis without myelopathy or radiculopathy, lumbar region: Secondary | ICD-10-CM

## 2024-04-09 DIAGNOSIS — M4316 Spondylolisthesis, lumbar region: Secondary | ICD-10-CM

## 2024-04-09 DIAGNOSIS — M5136 Other intervertebral disc degeneration, lumbar region with discogenic back pain only: Secondary | ICD-10-CM

## 2024-04-13 ENCOUNTER — Ambulatory Visit: Admitting: Internal Medicine

## 2024-04-30 ENCOUNTER — Ambulatory Visit (HOSPITAL_BASED_OUTPATIENT_CLINIC_OR_DEPARTMENT_OTHER): Admitting: Orthopaedic Surgery

## 2024-06-07 ENCOUNTER — Ambulatory Visit: Admitting: Dermatology

## 2024-06-11 ENCOUNTER — Telehealth: Admitting: Orthopedic Surgery
